# Patient Record
Sex: Male | Born: 1970
Health system: Southern US, Community
[De-identification: ages and names within clinical notes are randomized; demographics above are authoritative.]

## PROBLEM LIST (undated history)

## (undated) DIAGNOSIS — F17200 Nicotine dependence, unspecified, uncomplicated: Secondary | ICD-10-CM

## (undated) DIAGNOSIS — I10 Essential (primary) hypertension: Secondary | ICD-10-CM

## (undated) DIAGNOSIS — I472 Ventricular tachycardia: Secondary | ICD-10-CM

## (undated) DIAGNOSIS — Z95810 Presence of automatic (implantable) cardiac defibrillator: Secondary | ICD-10-CM

## (undated) DIAGNOSIS — K0889 Other specified disorders of teeth and supporting structures: Secondary | ICD-10-CM

## (undated) DIAGNOSIS — M1990 Unspecified osteoarthritis, unspecified site: Secondary | ICD-10-CM

## (undated) DIAGNOSIS — I5022 Chronic systolic (congestive) heart failure: Secondary | ICD-10-CM

## (undated) DIAGNOSIS — I213 ST elevation (STEMI) myocardial infarction of unspecified site: Secondary | ICD-10-CM

## (undated) DIAGNOSIS — R519 Headache, unspecified: Secondary | ICD-10-CM

## (undated) DIAGNOSIS — Z720 Tobacco use: Secondary | ICD-10-CM

## (undated) DIAGNOSIS — E785 Hyperlipidemia, unspecified: Secondary | ICD-10-CM

## (undated) DIAGNOSIS — E669 Obesity, unspecified: Secondary | ICD-10-CM

## (undated) DIAGNOSIS — I255 Ischemic cardiomyopathy: Secondary | ICD-10-CM

## (undated) DIAGNOSIS — I2510 Atherosclerotic heart disease of native coronary artery without angina pectoris: Secondary | ICD-10-CM

## (undated) DIAGNOSIS — R931 Abnormal findings on diagnostic imaging of heart and coronary circulation: Secondary | ICD-10-CM

## (undated) HISTORY — PX: CORONARY ANGIOPLASTY: SHX604

## (undated) HISTORY — DX: Ventricular tachycardia: I47.2

## (undated) HISTORY — PX: OTHER SURGICAL HISTORY: SHX169

## (undated) HISTORY — DX: Other specified disorders of teeth and supporting structures: K08.89

## (undated) HISTORY — DX: Presence of automatic (implantable) cardiac defibrillator: Z95.810

## (undated) HISTORY — DX: Obesity, unspecified: E66.9

## (undated) HISTORY — DX: ST elevation (STEMI) myocardial infarction of unspecified site: I21.3

## (undated) HISTORY — DX: Ischemic cardiomyopathy: I25.5

## (undated) HISTORY — DX: Hyperlipidemia, unspecified: E78.5

## (undated) HISTORY — DX: Nicotine dependence, unspecified, uncomplicated: F17.200

## (undated) HISTORY — DX: Tobacco use: Z72.0

---

## 1992-01-12 HISTORY — PX: HAND SURGERY: SHX662

## 1998-04-24 ENCOUNTER — Emergency Department (HOSPITAL_COMMUNITY): Admission: EM | Admit: 1998-04-24 | Discharge: 1998-04-24 | Payer: Self-pay | Admitting: Emergency Medicine

## 1998-09-19 ENCOUNTER — Encounter: Payer: Self-pay | Admitting: Emergency Medicine

## 1998-09-19 ENCOUNTER — Ambulatory Visit (HOSPITAL_COMMUNITY): Admission: RE | Admit: 1998-09-19 | Discharge: 1998-09-20 | Payer: Self-pay | Admitting: Orthopedic Surgery

## 1998-09-19 ENCOUNTER — Emergency Department (HOSPITAL_COMMUNITY): Admission: EM | Admit: 1998-09-19 | Discharge: 1998-09-19 | Payer: Self-pay | Admitting: Emergency Medicine

## 1999-02-25 ENCOUNTER — Ambulatory Visit (HOSPITAL_BASED_OUTPATIENT_CLINIC_OR_DEPARTMENT_OTHER): Admission: RE | Admit: 1999-02-25 | Discharge: 1999-02-25 | Payer: Self-pay | Admitting: Orthopedic Surgery

## 2001-03-24 ENCOUNTER — Ambulatory Visit (HOSPITAL_COMMUNITY): Admission: RE | Admit: 2001-03-24 | Discharge: 2001-03-24 | Payer: Self-pay | Admitting: Family Medicine

## 2001-03-24 ENCOUNTER — Encounter: Payer: Self-pay | Admitting: Family Medicine

## 2001-07-28 ENCOUNTER — Emergency Department (HOSPITAL_COMMUNITY): Admission: EM | Admit: 2001-07-28 | Discharge: 2001-07-28 | Payer: Self-pay | Admitting: Emergency Medicine

## 2002-04-10 ENCOUNTER — Encounter: Payer: Self-pay | Admitting: *Deleted

## 2002-04-10 ENCOUNTER — Emergency Department (HOSPITAL_COMMUNITY): Admission: EM | Admit: 2002-04-10 | Discharge: 2002-04-10 | Payer: Self-pay | Admitting: Unknown Physician Specialty

## 2005-10-30 ENCOUNTER — Inpatient Hospital Stay (HOSPITAL_COMMUNITY): Admission: EM | Admit: 2005-10-30 | Discharge: 2005-11-03 | Payer: Self-pay | Admitting: Emergency Medicine

## 2005-11-01 ENCOUNTER — Encounter (INDEPENDENT_AMBULATORY_CARE_PROVIDER_SITE_OTHER): Payer: Self-pay | Admitting: Cardiovascular Disease

## 2005-12-14 ENCOUNTER — Ambulatory Visit (HOSPITAL_COMMUNITY): Admission: RE | Admit: 2005-12-14 | Discharge: 2005-12-14 | Payer: Self-pay | Admitting: Cardiovascular Disease

## 2007-03-28 ENCOUNTER — Inpatient Hospital Stay (HOSPITAL_COMMUNITY): Admission: EM | Admit: 2007-03-28 | Discharge: 2007-03-29 | Payer: Self-pay | Admitting: Emergency Medicine

## 2008-05-07 ENCOUNTER — Inpatient Hospital Stay (HOSPITAL_COMMUNITY): Admission: EM | Admit: 2008-05-07 | Discharge: 2008-05-09 | Payer: Self-pay | Admitting: Emergency Medicine

## 2008-05-08 HISTORY — PX: CARDIAC CATHETERIZATION: SHX172

## 2008-07-09 ENCOUNTER — Encounter: Admission: RE | Admit: 2008-07-09 | Discharge: 2008-07-09 | Payer: Self-pay | Admitting: Cardiology

## 2008-10-17 ENCOUNTER — Emergency Department (HOSPITAL_COMMUNITY): Admission: EM | Admit: 2008-10-17 | Discharge: 2008-10-17 | Payer: Self-pay | Admitting: Emergency Medicine

## 2009-03-27 ENCOUNTER — Inpatient Hospital Stay (HOSPITAL_COMMUNITY): Admission: EM | Admit: 2009-03-27 | Discharge: 2009-04-01 | Payer: Self-pay | Admitting: Emergency Medicine

## 2009-03-31 HISTORY — PX: CARDIAC CATHETERIZATION: SHX172

## 2009-04-09 ENCOUNTER — Encounter: Payer: Self-pay | Admitting: Internal Medicine

## 2009-04-16 ENCOUNTER — Encounter: Payer: Self-pay | Admitting: Internal Medicine

## 2009-04-30 ENCOUNTER — Encounter: Payer: Self-pay | Admitting: Internal Medicine

## 2009-09-17 ENCOUNTER — Ambulatory Visit: Payer: Self-pay | Admitting: Cardiology

## 2009-09-17 ENCOUNTER — Encounter: Payer: Self-pay | Admitting: Internal Medicine

## 2009-09-23 ENCOUNTER — Ambulatory Visit (HOSPITAL_COMMUNITY): Admission: RE | Admit: 2009-09-23 | Discharge: 2009-09-23 | Payer: Self-pay | Admitting: Cardiology

## 2009-09-23 ENCOUNTER — Ambulatory Visit: Payer: Self-pay | Admitting: Cardiology

## 2009-09-23 ENCOUNTER — Encounter: Payer: Self-pay | Admitting: Internal Medicine

## 2009-10-29 ENCOUNTER — Encounter: Payer: Self-pay | Admitting: Internal Medicine

## 2009-10-29 ENCOUNTER — Ambulatory Visit: Payer: Self-pay | Admitting: Internal Medicine

## 2009-10-29 DIAGNOSIS — F17200 Nicotine dependence, unspecified, uncomplicated: Secondary | ICD-10-CM | POA: Insufficient documentation

## 2009-10-29 HISTORY — DX: Nicotine dependence, unspecified, uncomplicated: F17.200

## 2009-10-30 ENCOUNTER — Ambulatory Visit: Payer: Self-pay | Admitting: Cardiology

## 2009-10-31 ENCOUNTER — Encounter: Payer: Self-pay | Admitting: Internal Medicine

## 2009-10-31 ENCOUNTER — Telehealth: Payer: Self-pay | Admitting: Internal Medicine

## 2009-11-06 ENCOUNTER — Ambulatory Visit: Payer: Self-pay | Admitting: Internal Medicine

## 2009-11-10 ENCOUNTER — Telehealth (INDEPENDENT_AMBULATORY_CARE_PROVIDER_SITE_OTHER): Payer: Self-pay | Admitting: *Deleted

## 2009-11-11 DIAGNOSIS — Z95810 Presence of automatic (implantable) cardiac defibrillator: Secondary | ICD-10-CM

## 2009-11-11 HISTORY — DX: Presence of automatic (implantable) cardiac defibrillator: Z95.810

## 2009-11-11 HISTORY — PX: CARDIAC DEFIBRILLATOR PLACEMENT: SHX171

## 2009-11-12 ENCOUNTER — Ambulatory Visit: Payer: Self-pay | Admitting: Internal Medicine

## 2009-11-12 ENCOUNTER — Ambulatory Visit (HOSPITAL_COMMUNITY): Admission: RE | Admit: 2009-11-12 | Discharge: 2009-11-13 | Payer: Self-pay | Admitting: Internal Medicine

## 2009-11-13 ENCOUNTER — Encounter: Payer: Self-pay | Admitting: Internal Medicine

## 2009-11-18 ENCOUNTER — Encounter: Payer: Self-pay | Admitting: Internal Medicine

## 2009-11-24 ENCOUNTER — Ambulatory Visit: Payer: Self-pay | Admitting: Cardiology

## 2009-11-26 ENCOUNTER — Ambulatory Visit: Payer: Self-pay

## 2009-12-01 ENCOUNTER — Telehealth (INDEPENDENT_AMBULATORY_CARE_PROVIDER_SITE_OTHER): Payer: Self-pay | Admitting: *Deleted

## 2009-12-15 ENCOUNTER — Ambulatory Visit (HOSPITAL_BASED_OUTPATIENT_CLINIC_OR_DEPARTMENT_OTHER): Admission: RE | Admit: 2009-12-15 | Payer: Self-pay | Admitting: Internal Medicine

## 2010-02-10 NOTE — Progress Notes (Signed)
  Phone Note Outgoing Call   Call placed by: rhonda Call placed to: Patient Summary of Call: left message to adv pt to hold Effient for 3 days before procedure on 11/2, stop on 10/3.  Initial call taken by: Claris Gladden RN,  October 31, 2009 3:58 PM

## 2010-02-10 NOTE — Letter (Signed)
Summary: [facility] - Echo  [facility]   Imported By: Marylou Mccoy 11/24/2009 12:10:07  _____________________________________________________________________  External Attachment:    Type:   Image     Comment:   External Document

## 2010-02-10 NOTE — Assessment & Plan Note (Signed)
Summary: NEP. DISCUSS ICD PLACEMENT. PT HAS BCBS/ [place]   Visit Type:  Initial Consult  CC:  discuss defib placement.  History of Present Illness: [name] is seen at the request of Dr. Deborah Chalk for consideration of ICD implantation.  The patient has a complex cardiac history dating back to 2007 when he suffered an anterior wall MI and underwent LAD stenting. The next year he had occlusion of the LAD with repeat catheterization and restenting. In 2010 he suffered a non-STEMI and underwent stenting of his RCA. In March of this year he had a repeat infarction and his anterior wall distribution and underwent emergency angioplasty with thrombus extraction of his previously stented segment. He then underwent intervention of his circumflex artery. Recent reassessment of left ventricular function by echo in September demonstrated persistent depression in the 30-35% range.  He continues to smoke although is down to 3 cigarettes a day. He is a powerful family history with associated risk factors of obesity and hyperlipidemia.  He also has sleep disordered breathing and daytime fatigue and according to his wife nocturnal apnea.  He is amazingly well able to get around. He is able to climb stairs is able to play softball.    Current Medications (verified): 1)  Ramipril 10 Mg Caps (Ramipril) .... Take One Capsule By Mouth Daily 2)  Effient 10 Mg Tabs (Prasugrel Hcl) .[name].. 1 Tab Qd 3)  Toprol Xl 50 Mg Xr24h-Tab (Metoprolol Succinate) .... Daily 4)  Lipitor 80 Mg Tabs (Atorvastatin Calcium) .... Take One Tablet By Mouth Daily. 5)  Wellbutrin Xl 150 Mg Xr24h-Tab (Bupropion Hcl) .[name].. 1 Tab Once Daily 6)  Trilipix 135 Mg Cpdr (Choline Fenofibrate) .[name].. 1 Tab Once Daily 7)  Aspirin Ec 325 Mg Tbec (Aspirin) .... Take One Tablet By Mouth Daily 8)  Nitrostat 0.4 Mg Subl (Nitroglycerin) .[name].. 1 Tablet Under Tongue At Onset of Chest Pain; You May Repeat Every 5 Minutes For Up To 3 Doses.  Allergies  (verified): No Known Drug Allergies  Past History:  Family History: Last updated: 10/27/2009 His father had bypass surgery in his 29s His mother has had problems with cancer, all of his siblings are younger  Social History: Last updated: 10/29/2009 He is married.   Lives with his wife. 3 children He has had a long history of tobacco abuse and unfortunately resumed smoking.   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   Lives with his wife. 3 children He has had a long history of tobacco abuse and unfortunately resumed smoking.   He does not drink alcohol  Review of Systems       full review of systems was negative apart from a history of present illness and past medical history except as listed above   Vital Signs:  Patient profile:   40 year old male Height:      72 inches Weight:      259 pounds BMI:     35.25 Pulse rate:   65 / minute BP sitting:   117 / 74  (left arm) Cuff size:   large  Vitals Entered By: Burnett Kanaris, CNA (October 29, 2009 11:01 AM)  Physical Exam  General:  Well developed, well nourished obese young Caucasian male appearing his stated age, in no acute distress. Head:  normal HEENT Neck:  supple without thyromegaly neck veins  flat carotids brisk and full Chest Wall:  without kyphosis Lungs:  clear to auscultation Heart:  regular rate and rhythm Abdomen:  soft non tender BS without hepatomegaly or HJR Msk:  no obvious skeletal abnormalities Pulses:  intact distal pulses Extremities:  no clubbing cyanosis or edema Neurologic:  alert and oriented x3 and grossly normal motor and sensory function(obviously grave and I coordination with  a batting average of over 800) Skin:  warm and dry Cervical Nodes:  without adenopathy Psych:   engaging   Impression & Recommendations:  Problem # 1:  CARDIOMYOPATHY, ISCHEMIC MULTIPLE STENTS (ICD-414.8) the patient has a severe ischemic cardiomyopathy with ejection fraction of 30-35% and class 1-2 heart symptoms. He has a narrow QRS presents relatively).  We spent a long time reviewing the physiology of electrical complications of myocardial infarction and ischemic heart disease. We've discussed the potential benefits of ICD implantation as well as the risks including not limited to death perforation and infection. I've also noted that he is on Effient which is associated with a modestly high risk of pocket hematoma. I will discuss with Dr. Dennison Bulla as to whether he could be safely taken off for 3-4 days.   His updated medication list for this problem includes:    Ramipril 10 Mg Caps (Ramipril) .[name]... Take one capsule by mouth daily    Effient 10 Mg Tabs (Prasugrel hcl) .[name]... 1 tab qd    Toprol Xl 50 Mg Xr24h-tab (Metoprolol succinate) .[name]... Daily    Aspirin Ec 325 Mg Tbec (Aspirin) .[name]... Take one tablet by mouth daily    Nitrostat 0.4 Mg Subl (Nitroglycerin) .[name]... 1 tablet under tongue at onset of chest pain; you may repeat every 5 minutes for up to 3 doses.  Problem # 2:  SLEEP DISORDERED BREATHING (ICD-780.57) We will go ahead and arrange for out patient sllep study  Problem # 3:  TOBACCO USER (ICD-305.1) ENOCURAGED TO GET OFF SMOKING   Other Orders: Misc. Referral (Misc. Ref)  Patient Instructions: 1)  Your physician recommends that you continue on your current medications as directed. Please refer to the Current Medication list given to you today. 2)  Your physician has recommended that you have a sleep study.  Nightwatch. This test records several body functions during sleep, including:  brain activity, eye movement, oxygen and carbon dioxide blood levels, heart rate and rhythm, breathing rate and rhythm, the flow of air through your mouth and nose, snoring, body muscle  movements, and chest and belly movement.

## 2010-02-10 NOTE — Letter (Signed)
Summary: [facility] - Office Visit  [facility] - Office Visit   Imported By: Marylou Mccoy 11/24/2009 12:07:21  _____________________________________________________________________  External Attachment:    Type:   Image     Comment:   External Document

## 2010-02-10 NOTE — Letter (Signed)
Summary: [facility] - Office Visit  [facility] - Office Visit   Imported By: Marylou Mccoy 11/24/2009 12:13:34  _____________________________________________________________________  External Attachment:    Type:   Image     Comment:   External Document

## 2010-02-10 NOTE — Progress Notes (Signed)
  Phone Note Outgoing Call   Call placed by: rhonda Call placed to: Patient Details for Reason: confirming holding effient Summary of Call: Tried pt at home number and generic voice mail message answered-no message left. Tried work number and they stated no one by that name was there. Unable to confirm that previous voice message to hold Effient was received.  Initial call taken by: Claris Gladden RN,  November 10, 2009 12:12 PM

## 2010-02-10 NOTE — Cardiovascular Report (Signed)
Summary: Pre Op Orders   Pre Op Orders   Imported By: Roderic Ovens 11/07/2009 13:08:56  _____________________________________________________________________  External Attachment:    Type:   Image     Comment:   External Document

## 2010-02-10 NOTE — Letter (Signed)
Summary: Implantable Device Instructions  Architectural technologist, Main Office  [address]   [place], [place] [address]   Phone: [phone]  Fax: [phone]      Implantable Device Instructions  You are scheduled for:   __x___ Implantable Cardioverter Defibrillator   on November 12, 2009 @ noon with Dr. Graciela Husbands.  1.  Please arrive at the Short Stay Center at [facility] at 10:00 am on the day of your procedure.  2.  You may have a clear liquid breakfast the morning of your procedure before 6:00 am. Clear liquids include water, broth, Sprite, Ginger Ale, black coffee, tea (no sugar), cranberry/grape/apple juice, jello, popsicle from clear juices. Nothing to eat or drink after 6:00 am.   3.  Complete lab work on November 06, 2009 at 11:00 am.  The lab at [address] is open from 8:30 AM to 1:30 PM and from 2:30 PM to 5:00 PM.  The lab at [facility] is open from 7:30 AM to 5:30 PM.  You do not have to be fasting.   4.  Plan for an overnight stay.  Bring your insurance cards and a list of your medications.  6.  Wash your chest and neck with antibacterial soap (any brand) the evening before and the morning of your procedure.  Rinse well.    *If you have ANY questions after you get home, please call the office [phone].  Claris Gladden, RN  *Every attempt is made to prevent procedures from being rescheduled.  Due to the nauture of Electrophysiology, rescheduling can happen.  The physician is always aware and directs the staff when this occurs.

## 2010-02-10 NOTE — Letter (Signed)
Summary: [facility] - Office Visit  [facility] - Office Visit   Imported By: Marylou Mccoy 11/24/2009 12:06:49  _____________________________________________________________________  External Attachment:    Type:   Image     Comment:   External Document

## 2010-02-10 NOTE — Procedures (Signed)
Summary: wound check.mdt.amber   Current Medications (verified): 1)  Ramipril 10 Mg Caps (Ramipril) .... Take One Capsule By Mouth Daily 2)  Effient 10 Mg Tabs (Prasugrel Hcl) .[name].. 1 Tab Qd 3)  Toprol Xl 50 Mg Xr24h-Tab (Metoprolol Succinate) .... Daily 4)  Lipitor 80 Mg Tabs (Atorvastatin Calcium) .... Take One Tablet By Mouth Daily. 5)  Wellbutrin Xl 150 Mg Xr24h-Tab (Bupropion Hcl) .[name].. 1 Tab Once Daily 6)  Trilipix 135 Mg Cpdr (Choline Fenofibrate) .[name].. 1 Tab Once Daily 7)  Aspirin Ec 325 Mg Tbec (Aspirin) .... Take One Tablet By Mouth Daily 8)  Nitrostat 0.4 Mg Subl (Nitroglycerin) .[name].. 1 Tablet Under Tongue At Onset of Chest Pain; You May Repeat Every 5 Minutes For Up To 3 Doses. 9)  Omeprazole 20 Mg Cpdr (Omeprazole) .... Take 1 Capsule By Mouth Once Daily  Allergies (verified): No Known Drug Allergies   ICD Specifications Following MD:  Sherryl Manges, MD     ICD Vendor:  Medtronic     ICD Model Number:  [phone]     ICD Serial Number:  [id number] ICD DOI:  11/12/2009     ICD Implanting MD:  Sherryl Manges, MD  Lead 1:    Location: RV     DOI: 11/12/2009     Model #: [id number]     Serial #: [id number]     Status: active  Indications::  ICM   ICD Follow Up Battery Voltage:  3.24 V     Charge Time:  8.4 seconds     Underlying rhythm:  SR   ICD Device Measurements Right Ventricle:  Amplitude: 12.6 mV, Impedance: 399 ohms, Threshold: 1.25 V at 0.40 msec Shock Impedance: 46/60 ohms   Episodes MS Episodes:  0     Shock:  0     ATP:  0     Nonsustained:  0     Atrial Therapies:  0 Ventricular Pacing:  <0.1%  Brady Parameters Mode VVI     Lower Rate Limit:  40      Tachy Zones VF:  200     Next Cardiology Appt Due:  03/03/2010 Tech Comments:  WOUND CHECK---STERI STRIPS REMOVED BY CARD.  NO REDNESS AND MINIMAL SWELLING AT SITE.  NORMAL DEVICE FUNCTION.  NO CHANGES MADE. PT WOULD LIKE TO BE ENROLLED IN [place].  ROV 03-03-10 @SK . Vella Kohler  November 26, 2009 12:40 PM

## 2010-02-10 NOTE — Letter (Signed)
Summary: [facility] - Office Visit  [facility] - Office Visit   Imported By: Marylou Mccoy 11/24/2009 12:05:06  _____________________________________________________________________  External Attachment:    Type:   Image     Comment:   External Document

## 2010-02-10 NOTE — Miscellaneous (Signed)
Summary: Device preload  Clinical Lists Changes  Observations: Added new observation of ICD INDICATN: ICM (11/18/2009 13:35) Added new observation of ICDLEADSTAT1: active (11/18/2009 13:35) Added new observation of ICDLEADSER1: [id number] (11/18/2009 13:35) Added new observation of ICDLEADMOD1: 4098  (11/18/2009 13:35) Added new observation of ICDLEADLOC1: RV  (11/18/2009 13:35) Added new observation of ICD IMP MD: Sherryl Manges, MD  (11/18/2009 13:35) Added new observation of ICDLEADDOI1: 11/12/2009  (11/18/2009 13:35) Added new observation of ICD IMPL DTE: 11/12/2009  (11/18/2009 13:35) Added new observation of ICD SERL#: [id number] H  (11/18/2009 13:35) Added new observation of ICD MODL#: D334  (11/18/2009 13:35) Added new observation of ICDMANUFACTR: Medtronic  (11/18/2009 13:35) Added new observation of ICD MD: Sherryl Manges, MD  (11/18/2009 13:35)       ICD Specifications Following MD:  Sherryl Manges, MD     ICD Vendor:  Medtronic     ICD Model Number:  [phone]     ICD Serial Number:  [id number] ICD DOI:  11/12/2009     ICD Implanting MD:  Sherryl Manges, MD  Lead 1:    Location: RV     DOI: 11/12/2009     Model #: [id number]     Serial #: [id number]     Status: active  Indications::  ICM

## 2010-02-10 NOTE — Progress Notes (Signed)
  DDS request received sent to [facility]  December 01, 2009 9:28 AM

## 2010-02-10 NOTE — Assessment & Plan Note (Signed)
Summary: Jonathan Fischer

## 2010-03-03 ENCOUNTER — Encounter (INDEPENDENT_AMBULATORY_CARE_PROVIDER_SITE_OTHER): Payer: BC Managed Care – PPO | Admitting: Internal Medicine

## 2010-03-03 ENCOUNTER — Ambulatory Visit: Payer: Self-pay | Admitting: Cardiology

## 2010-03-03 ENCOUNTER — Encounter: Payer: Self-pay | Admitting: Internal Medicine

## 2010-03-03 DIAGNOSIS — I2589 Other forms of chronic ischemic heart disease: Secondary | ICD-10-CM

## 2010-03-03 DIAGNOSIS — Z95810 Presence of automatic (implantable) cardiac defibrillator: Secondary | ICD-10-CM

## 2010-03-03 HISTORY — DX: Presence of automatic (implantable) cardiac defibrillator: Z95.810

## 2010-03-05 ENCOUNTER — Ambulatory Visit (INDEPENDENT_AMBULATORY_CARE_PROVIDER_SITE_OTHER): Payer: BC Managed Care – PPO | Admitting: Cardiology

## 2010-03-05 DIAGNOSIS — I2510 Atherosclerotic heart disease of native coronary artery without angina pectoris: Secondary | ICD-10-CM

## 2010-03-05 DIAGNOSIS — I2589 Other forms of chronic ischemic heart disease: Secondary | ICD-10-CM

## 2010-03-05 DIAGNOSIS — E7800 Pure hypercholesterolemia, unspecified: Secondary | ICD-10-CM

## 2010-03-10 NOTE — Assessment & Plan Note (Signed)
Summary: dfib ck/medtronic/amber   History of Present Illness: [name] is seen i followup for an ICD implanted for complex coronary disease. This was done in November 2011. He has had no intercurrent febrile or discharges. He denies chest pain shortness of breath or palpitations.   The patient has a complex cardiac history dating back to 2007 when he suffered an anterior wall MI and underwent LAD stenting. The next year he had occlusion of the LAD with repeat catheterization and restenting. In 2010 he suffered a non-STEMI and underwent stenting of his RCA. In March of this year he had a repeat infarction and his anterior wall distribution and underwent emergency angioplasty with thrombus extraction of his previously stented segment. He then underwent intervention of his circumflex artery. Recent reassessment of left ventricular function by echo in September demonstrated persistent depression in the 30-35% range.       Current Medications (verified): 1)  Ramipril 10 Mg Caps (Ramipril) .... Take One Capsule By Mouth Daily 2)  Effient 10 Mg Tabs (Prasugrel Hcl) .[name].. 1 Tab Qd 3)  Toprol Xl 50 Mg Xr24h-Tab (Metoprolol Succinate) .... Daily 4)  Lipitor 80 Mg Tabs (Atorvastatin Calcium) .... Take One Tablet By Mouth Daily. 5)  Wellbutrin Xl 150 Mg Xr24h-Tab (Bupropion Hcl) .[name].. 1 Tab Once Daily 6)  Trilipix 135 Mg Cpdr (Choline Fenofibrate) .[name].. 1 Tab Once Daily 7)  Aspirin Ec 325 Mg Tbec (Aspirin) .... Take One Tablet By Mouth Daily 8)  Nitrostat 0.4 Mg Subl (Nitroglycerin) .[name].. 1 Tablet Under Tongue At Onset of Chest Pain; You May Repeat Every 5 Minutes For Up To 3 Doses. 9)  Omeprazole 20 Mg Cpdr (Omeprazole) .... 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   Lives with his wife. 3 children He has had a long history of tobacco abuse and unfortunately resumed smoking.   He does not drink alcohol  Vital Signs:  Patient profile:   40 year old male Height:      72 inches Weight:      263 pounds BMI:     35.80 Pulse rate:   67 / minute Pulse rhythm:   regular BP sitting:   118 / 76  (right arm) Cuff size:   large  Vitals Entered By: Judithe Modest CMA (March 03, 2010 10:43 AM)  Physical Exam  General:  The patient was alert and oriented in no acute distress. HEENT Normal.  Neck veins were flat, carotids were brisk.  Lungs were clear.  Heart sounds were regular without murmurs or gallops.  Abdomen was soft with active bowel sounds. There is no clubbing cyanosis or edema. Skin Warm and dry as well he    ICD Specifications Following MD:  Sherryl Manges, MD     ICD Vendor:  Medtronic     ICD Model Number:  [phone]     ICD Serial Number:  [id number] ICD DOI:  11/12/2009     ICD Implanting MD:  Sherryl Manges, MD  Lead 1:    Location: RV     DOI: 11/12/2009     Model #:  [id number]     Serial #: [id number]     Status: active  Indications::  ICM   Brady Parameters Mode VVI     Lower Rate Limit:  40      Tachy Zones VF:  200     Impression & Recommendations:  Problem # 1:  AUTOMATIC IMPLANTABLE CARDIAC DEFIBRILLATOR SITU (ICD-V45.02) Device parameters and data were reviewed and no changes were made  Problem # 2:  CARDIOMYOPATHY, ISCHEMIC MULTIPLE STENTS (ICD-414.8) stable on current medications His updated medication list for this problem includes:    Ramipril 10 Mg Caps (Ramipril) .[name]... Take one capsule by mouth daily    Effient 10 Mg Tabs (Prasugrel hcl) .[name]... 1  tab qd    Toprol Xl 50 Mg Xr24h-tab (Metoprolol succinate) .[name]... Daily    Aspirin Ec 325 Mg Tbec (Aspirin) .[name]... Take one tablet by mouth daily    Nitrostat 0.4 Mg Subl (Nitroglycerin) .[name]... 1 tablet under tongue at onset of chest pain; you may repeat every 5 minutes for up to 3 doses.  Patient Instructions: 1)  Your physician recommends that you continue on your current medications as directed. Please refer to the Current Medication list given to you today. 2)  Your physician wants you to follow-up in:   NOV 2012 WITH DR Logan Bores will receive a reminder letter in the mail two months in advance. If you don't receive a letter, please call our office to schedule the follow-up appointment.

## 2010-03-11 ENCOUNTER — Telehealth (INDEPENDENT_AMBULATORY_CARE_PROVIDER_SITE_OTHER): Payer: Self-pay | Admitting: *Deleted

## 2010-03-19 NOTE — Cardiovascular Report (Signed)
Summary: Office Visit   Office Visit   Imported By: Roderic Ovens 03/12/2010 16:07:35  _____________________________________________________________________  External Attachment:    Type:   Image     Comment:   External Document

## 2010-03-19 NOTE — Progress Notes (Signed)
  DDS Request received sent to [facility]  [facility]  March 11, 2010 8:39 AM     Appended Document:  DDS Request Received sent to [facility]

## 2010-03-24 LAB — BASIC METABOLIC PANEL
CO2: 25 mEq/L (ref 19–32)
Calcium: 9.1 mg/dL (ref 8.4–10.5)
GFR calc Af Amer: >60 mL/min (ref >60)
GFR calc non Af Amer: >60 mL/min (ref >60)
Potassium: 4 mEq/L (ref 3.5–5.1)
Sodium: 140 mEq/L (ref 135–145)

## 2010-03-24 LAB — SURGICAL PCR SCREEN
MRSA, PCR: NEGATIVE
Staphylococcus aureus: NEGATIVE

## 2010-03-24 LAB — CBC
HCT: 43.5 % (ref 39.0–52.0)
Hemoglobin: 15.4 g/dL (ref 13.0–17.0)
RBC: 4.76 MIL/uL (ref 4.22–5.81)

## 2010-03-24 LAB — PROTIME-INR
INR: 1.02 (ref 0.00–1.49)
Prothrombin Time: 13.6 seconds (ref 11.6–15.2)

## 2010-04-05 LAB — CBC
HCT: 41.3 % (ref 39.0–52.0)
HCT: 44.6 % (ref 39.0–52.0)
HCT: 46.2 % (ref 39.0–52.0)
HCT: 47.2 % (ref 39.0–52.0)
Hemoglobin: 14.6 g/dL (ref 13.0–17.0)
Hemoglobin: 15.5 g/dL (ref 13.0–17.0)
Hemoglobin: 16 g/dL (ref 13.0–17.0)
MCHC: 34.6 g/dL (ref 30.0–36.0)
MCHC: 34.7 g/dL (ref 30.0–36.0)
MCV: 93.6 fL (ref 78.0–100.0)
MCV: 93.8 fL (ref 78.0–100.0)
Platelets: 233 10*3/uL (ref 150–400)
Platelets: 238 10*3/uL (ref 150–400)
Platelets: 277 10*3/uL (ref 150–400)
RBC: 5.04 MIL/uL (ref 4.22–5.81)
RDW: 12.8 % (ref 11.5–15.5)
RDW: 12.9 % (ref 11.5–15.5)
RDW: 13.4 % (ref 11.5–15.5)
WBC: 11.1 10*3/uL — ABNORMAL HIGH (ref 4.0–10.5)
WBC: 11.8 10*3/uL — ABNORMAL HIGH (ref 4.0–10.5)

## 2010-04-05 LAB — BASIC METABOLIC PANEL
BUN: 12 mg/dL (ref 6–23)
BUN: 14 mg/dL (ref 6–23)
CO2: 26 mEq/L (ref 19–32)
CO2: 26 mEq/L (ref 19–32)
Calcium: 8.7 mg/dL (ref 8.4–10.5)
Calcium: 9.2 mg/dL (ref 8.4–10.5)
Chloride: 100 mEq/L (ref 96–112)
Chloride: 102 mEq/L (ref 96–112)
Creatinine, Ser: 0.93 mg/dL (ref 0.4–1.5)
GFR calc Af Amer: >60 mL/min (ref >60)
GFR calc non Af Amer: >60 mL/min (ref >60)
GFR calc non Af Amer: >60 mL/min (ref >60)
GFR calc non Af Amer: >60 mL/min (ref >60)
Glucose, Bld: 116 mg/dL — ABNORMAL HIGH (ref 70–99)
Glucose, Bld: 121 mg/dL — ABNORMAL HIGH (ref 70–99)
Glucose, Bld: 123 mg/dL — ABNORMAL HIGH (ref 70–99)
Potassium: 4.1 mEq/L (ref 3.5–5.1)
Potassium: 4.3 mEq/L (ref 3.5–5.1)
Sodium: 135 mEq/L (ref 135–145)
Sodium: 138 mEq/L (ref 135–145)
Sodium: 138 mEq/L (ref 135–145)

## 2010-04-05 LAB — CARDIAC PANEL(CRET KIN+CKTOT+MB+TROPI)
CK, MB: 3.7 ng/mL (ref 0.3–4.0)
Total CK: 453 U/L — ABNORMAL HIGH (ref 7–232)
Troponin I: 5.5 ng/mL (ref 0.00–0.06)

## 2010-04-05 LAB — LIPID PANEL
Cholesterol: 218 mg/dL — ABNORMAL HIGH (ref 0–200)
HDL: 30 mg/dL — ABNORMAL LOW (ref >39)
Total CHOL/HDL Ratio: 7.3 RATIO

## 2010-04-05 LAB — POCT CARDIAC MARKERS: Myoglobin, poc: 71.6 ng/mL (ref 12–200)

## 2010-04-05 LAB — PROTIME-INR
INR: 1.05 (ref 0.00–1.49)
Prothrombin Time: 13.6 seconds (ref 11.6–15.2)

## 2010-04-05 LAB — DIFFERENTIAL
Eosinophils Absolute: 0 10*3/uL (ref 0.0–0.7)
Lymphocytes Relative: 26 % (ref 12–46)
Lymphs Abs: 3.1 10*3/uL (ref 0.7–4.0)
Neutro Abs: 7.8 10*3/uL — ABNORMAL HIGH (ref 1.7–7.7)
Neutrophils Relative %: 67 % (ref 43–77)

## 2010-04-05 LAB — COMPREHENSIVE METABOLIC PANEL
BUN: 10 mg/dL (ref 6–23)
CO2: 30 mEq/L (ref 19–32)
Chloride: 98 mEq/L (ref 96–112)
Creatinine, Ser: 1 mg/dL (ref 0.4–1.5)
GFR calc non Af Amer: >60 mL/min (ref >60)
Total Bilirubin: 0.7 mg/dL (ref 0.3–1.2)

## 2010-04-05 LAB — TSH: TSH: 0.549 u[IU]/mL (ref 0.350–4.500)

## 2010-04-05 LAB — APTT: aPTT: 26 seconds (ref 24–37)

## 2010-04-13 ENCOUNTER — Telehealth: Payer: Self-pay | Admitting: Cardiology

## 2010-04-13 NOTE — Telephone Encounter (Signed)
Pt's wife notified of the dates of heart attacks.

## 2010-04-13 NOTE — Telephone Encounter (Signed)
NEEDS DATES OF THE HEART ATTACKS. SHE THINKS THERE IS 3.

## 2010-04-16 LAB — CBC
HCT: 43.9 % (ref 39.0–52.0)
Hemoglobin: 15.1 g/dL (ref 13.0–17.0)
MCHC: 34.5 g/dL (ref 30.0–36.0)
RDW: 13.1 % (ref 11.5–15.5)

## 2010-04-16 LAB — POCT I-STAT, CHEM 8
Calcium, Ion: 1.15 mmol/L (ref 1.12–1.32)
Glucose, Bld: 141 mg/dL — ABNORMAL HIGH (ref 70–99)
HCT: 46 % (ref 39.0–52.0)
Hemoglobin: 15.6 g/dL (ref 13.0–17.0)
Potassium: 4.1 mEq/L (ref 3.5–5.1)

## 2010-04-16 LAB — HEPATIC FUNCTION PANEL
ALT: 44 U/L (ref 0–53)
AST: 25 U/L (ref 0–37)
Bilirubin, Direct: 0.1 mg/dL (ref 0.0–0.3)
Total Bilirubin: 0.8 mg/dL (ref 0.3–1.2)

## 2010-04-22 LAB — CBC
HCT: 38.8 % — ABNORMAL LOW (ref 39.0–52.0)
HCT: 45 % (ref 39.0–52.0)
Hemoglobin: 13.8 g/dL (ref 13.0–17.0)
Hemoglobin: 14.9 g/dL (ref 13.0–17.0)
Hemoglobin: 15.9 g/dL (ref 13.0–17.0)
MCHC: 34.8 g/dL (ref 30.0–36.0)
MCHC: 35.3 g/dL (ref 30.0–36.0)
MCV: 93.4 fL (ref 78.0–100.0)
MCV: 94.3 fL (ref 78.0–100.0)
Platelets: 173 10*3/uL (ref 150–400)
Platelets: 200 10*3/uL (ref 150–400)
RDW: 12.9 % (ref 11.5–15.5)
RDW: 13.1 % (ref 11.5–15.5)
RDW: 13.3 % (ref 11.5–15.5)
RDW: 13.3 % (ref 11.5–15.5)
WBC: 7.4 10*3/uL (ref 4.0–10.5)

## 2010-04-22 LAB — RAPID URINE DRUG SCREEN, HOSP PERFORMED
Amphetamines: NOT DETECTED
Cocaine: NOT DETECTED
Opiates: POSITIVE — AB
Tetrahydrocannabinol: NOT DETECTED

## 2010-04-22 LAB — CARDIAC PANEL(CRET KIN+CKTOT+MB+TROPI): Relative Index: 6.7 — ABNORMAL HIGH (ref 0.0–2.5)

## 2010-04-22 LAB — BASIC METABOLIC PANEL
BUN: 7 mg/dL (ref 6–23)
CO2: 24 mEq/L (ref 19–32)
Chloride: 104 mEq/L (ref 96–112)
Glucose, Bld: 103 mg/dL — ABNORMAL HIGH (ref 70–99)
Glucose, Bld: 111 mg/dL — ABNORMAL HIGH (ref 70–99)
Potassium: 3.8 mEq/L (ref 3.5–5.1)
Potassium: 3.8 mEq/L (ref 3.5–5.1)
Sodium: 137 mEq/L (ref 135–145)
Sodium: 140 mEq/L (ref 135–145)

## 2010-04-22 LAB — POCT CARDIAC MARKERS
CKMB, poc: 2.2 ng/mL (ref 1.0–8.0)
Myoglobin, poc: 127 ng/mL (ref 12–200)
Troponin i, poc: <0.05 ng/mL (ref 0.00–0.09)
Troponin i, poc: <0.05 ng/mL (ref 0.00–0.09)

## 2010-04-22 LAB — CK TOTAL AND CKMB (NOT AT ARMC)
CK, MB: 10.7 ng/mL — ABNORMAL HIGH (ref 0.3–4.0)
Relative Index: 5 — ABNORMAL HIGH (ref 0.0–2.5)
Total CK: 216 U/L (ref 7–232)

## 2010-04-22 LAB — DIFFERENTIAL
Basophils Absolute: 0.1 10*3/uL (ref 0.0–0.1)
Basophils Relative: 1 % (ref 0–1)
Eosinophils Relative: 1 % (ref 0–5)
Lymphocytes Relative: 22 % (ref 12–46)

## 2010-04-22 LAB — COMPREHENSIVE METABOLIC PANEL
Albumin: 3.5 g/dL (ref 3.5–5.2)
BUN: 13 mg/dL (ref 6–23)
Calcium: 8.6 mg/dL (ref 8.4–10.5)
Creatinine, Ser: 0.85 mg/dL (ref 0.4–1.5)
Potassium: 3.9 mEq/L (ref 3.5–5.1)
Total Protein: 6.3 g/dL (ref 6.0–8.3)

## 2010-04-22 LAB — LIPID PANEL
Cholesterol: 135 mg/dL (ref 0–200)
HDL: 24 mg/dL — ABNORMAL LOW (ref >39)
LDL Cholesterol: UNDETERMINED mg/dL (ref 0–99)
Triglycerides: 468 mg/dL — ABNORMAL HIGH (ref <150)

## 2010-04-22 LAB — HEPARIN LEVEL (UNFRACTIONATED)
Heparin Unfractionated: 0.38 IU/mL (ref 0.30–0.70)
Heparin Unfractionated: 0.56 IU/mL (ref 0.30–0.70)

## 2010-04-22 LAB — PROTIME-INR: INR: 1.1 (ref 0.00–1.49)

## 2010-04-22 LAB — APTT: aPTT: 146 seconds — ABNORMAL HIGH (ref 24–37)

## 2010-04-29 ENCOUNTER — Emergency Department: Payer: Self-pay | Admitting: Emergency Medicine

## 2010-05-26 NOTE — Cardiovascular Report (Signed)
[name], Jonathan Fischer NO.:  [id number]   MEDICAL RECORD NO.:  [id number]          PATIENT TYPE:  INP   LOCATION:  2913                         FACILITY:  [facility]   PHYSICIAN:  Colleen Can. Deborah Chalk, M.D.DATE OF BIRTH:  12-18-1970   DATE OF PROCEDURE:  03/28/2007  DATE OF DISCHARGE:                            CARDIAC CATHETERIZATION   PROCEDURES:  1. Left heart catheterization.  2. Selective coronary angiography.  3. Left ventricular angiography.   TYPE AND SITE OF ENTRY:  Percutaneous right femoral artery.   CATHETERS:  6-French 4 curved Judkins right and left coronary catheters,  6-French pigtail ventriculographic catheter.   CONTRAST MATERIAL:  Omnipaque.   MEDICATIONS GIVEN PRIOR TO PROCEDURE:  Heparin.  The patient was on  aspirin and Plavix.   MEDICATIONS GIVEN DURING PROCEDURE:  Versed 4 mg IV.   COMMENT:  The patient tolerated the procedure well.   ANGIOGRAPHIC DATA:  The left ventricular angiogram was performed in the  RAO position.  Overall cardiac size and silhouette are normal.  There  was anterior apical hypokinesia with a global ejection fraction  estimated be of 35-40%.  There was no mitral regurgitation, intracardiac  calcification or intracavitary filling defect.   1. Right coronary artery:  The right coronary artery had proximal 30-      40% narrowings.  The distal vessel was relatively large with a      large posterior descending and posterior lateral branches.  2. His left main coronary artery is normal.  3. Left circumflex:  Left circumflex had 20-30% narrowings.  It ended      on the posterior lateral wall.  4. Left anterior descending:  Left anterior descending was widely      patent throughout.  There were stents in the proximal portion of      the left anterior descending between the first septal perforating      branch and the first diagonal vessel.  The stent was widely patent.      There was TIMI grade 3 flow.  There did not  appear to be any      residual thrombus noted in any of the distal branches.   OVERALL IMPRESSION:  1. Anteroapical hypokinesia with reduced left ventricular ejection      fraction.  2. Persistent patency of the stents in the proximal left anterior      descending.  3. Mild to moderate coronary atherosclerosis in the right coronary      artery and left circumflex system.   DISCUSSION:  In light of these findings, the patient will continue to be  managed medically.  We will rule out myocardial infarction under the  possibility at there was thrombosis that lysed prior to the procedure.  Overall it is felt that the patient is stable at this point in time.      Colleen Can. Deborah Chalk, M.D.  Electronically Signed     SNT/MEDQ  D:  03/28/2007  T:  03/28/2007  Job:  [id number]

## 2010-05-26 NOTE — H&P (Signed)
NAMEGLADSTONE, Jonathan Fischer NO.:  [id number]   MEDICAL RECORD NO.:  [id number]          PATIENT TYPE:  INP   LOCATION:  1832                         FACILITY:  [facility]   PHYSICIAN:  Colleen Can. Deborah Chalk, M.D.DATE OF BIRTH:  20-Aug-1970   DATE OF ADMISSION:  05/07/2008  DATE OF DISCHARGE:                              HISTORY & PHYSICAL   CHIEF COMPLAINT:  Chest Pain   HISTORY OF PRESENT ILLNESS:  Mr. 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  He was not particularly working hard, then  at approximately 10:00 a.m. he developed the chest pain that is  substernal in nature, he got hot, he took 2 nitroglycerin and then felt  somewhat weak.  EMS was called and he presented to the emergency room.  Approximately 4 weeks ago, he had an episode of chest pain that occurred  approximately 30 minutes after having played a ball game.  He took two  nitroglycerin at that point in time and got relief.   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  He  had recurrent occlusion of left anterior descending with catheterization  and stenting at [facility] in 2008.  He presented and had  catheterization in March 2009 with 30-40% narrowing of his right  coronary artery, left main coronary artery being normal, left circumflex  with 20-30% narrowing, and the left anterior descending is widely patent  with stents patent.  There was anteroapical hypokinesia but his global  ejection fraction estimated to be in the mildly reduced range.  He has  ongoing tobacco abuse.   ALLERGIES:  None known.   CURRENT MEDICATIONS:  1. Ramipril 5 mg a day.  2. Lipitor 80 mg a day.  3. Plavix 75 mg a day.  4. Metoprolol succinate 25 mg a day.  5. Wellbutrin 150 mg a day.  6. Protonix 40 mg every other day.  7. Aspirin 325 mg a day.  8. Lovaza 2 p.o. b.i.d.   REVIEW OF SYSTEMS:   Generally, he has been doing well and feels good.  He has had some numbness in his hands.  No back pain.  His hands have  been going to sleep. He continues to struggle with weight loss.  All  other review of systems are negative.   SOCIAL HISTORY:  Smokes 6 cigarettes per day.  He is married. Lives with  wife. No alcohol use.   FAMILY HISTORY:  Father had bypass in his 18s and mother has had  problems with cancer.  His brothers are all younger.   PHYSICAL EXAMINATION:  GENERAL:  He is a pleasant obese 40 year old  male.  He is in no acute distress.  VITAL SIGNS:  His blood pressure is 128/81, heart rate 60, respiratory  rate is 20.  SKIN:  Warm and dry.  COLOR: Normal.  HEENT:  He is normocephalic and atraumatic.  Pupils equal, round, and  reactive to light.  Oropharynx is clear.  NECK:  Supple and thick. No bruits. No  masses.  LUNGS:  Clear. He is not short of breath.  HEART:  Regular rate and rhythm. No murmur.  ABDOMEN:  Soft and nontender.  No masses.  EXTREMITIES:  Without edema.  Peripheral pulses are intact.  M/S:  ROM is intact. Gait not tested. Strength is symmetric.  NEURO:  Intact with no gross focal deficits.   LABORATORY DATA:  His EKG shows no acute changes.  It shows findings  compatible with an old anterior myocardial infarction but there were no  acute ST changes present. Other labs are pending.   OVERALL IMPRESSION:  1. Unstable angina, rule out myocardial infarction.  2. Previous anterior myocardial infarction with stents.  3. Hypercholesterolemia.  4. Ongoing cigarette abuse.   PLAN:  We will proceed on with cardiac catheterization tomorrow.  We  will rule out myocardial infarction.  Procedure risks and benefits have  been explained to the patient including procedure sustained.      Colleen Can. Deborah Chalk, M.D.  Electronically Signed     SNT/MEDQ  D:  05/07/2008  T:  05/08/2008  Job:  [id number]

## 2010-05-26 NOTE — Discharge Summary (Signed)
Jonathan Fischer, Jonathan Fischer NO.:  [id number]   MEDICAL RECORD NO.:  [id number]          PATIENT TYPE:  INP   LOCATION:  2913                         FACILITY:  [facility]   PHYSICIAN:  Colleen Can. Deborah Chalk, M.D.DATE OF BIRTH:  1970-12-08   DATE OF ADMISSION:  03/28/2007  DATE OF DISCHARGE:  03/29/2007                               DISCHARGE SUMMARY   DISCHARGE DIAGNOSES:  1. Chest pain in the setting of known ischemic heart disease with      subsequent emergent cardiac catheterization showing anterior      hypokinesis with ejection fraction of 35-40%, diffuse proximal      narrowing to the right coronary that is 30-40% in nature, less than      20% irregularities of the left circumflex, proximal stent patent in      the LAD and normal left main.  2. History of anterior myocardial infarction in October 2007 with      stent placement to the LAD.  He has had follow-up catheterization      since that time.  He had recurrent occlusion of the LAD with repeat      catheterization and stenting in [place] in 2008.  3. Ongoing tobacco abuse.  4. Hyperlipidemia   HISTORY OF PRESENT ILLNESS:  The patient is a 40 year old male who  presented to the emergency room with approximately one hour duration of  substernal chest pain.  It was similar to his previous chest pain  syndrome associated with myocardial infarction.  He has had ongoing  tobacco abuse of approximately five cigarettes a day.  He denies abusing  drugs and does take his medicines on a regular basis.  He was  subsequently seen in the emergency room.  There was concern for ST  elevation on his EKG. Code stenting was initiated, and the patient was  taken emergently to the cardiac catheterization lab.   Please see the History and Physical for the patient presentation and  profile.   LABORATORY DATA:  On admission CBC showed hemoglobin 15, hematocrit 44.  Chemistries were normal except for glucose of 129.  Cardiac  profile  showed all the CK-MBs to be negative, troponins were 0.09 to 0.18 to  0.12, respectively.   His EKG showed changes compatible with anterior myocardial infarction  with evidence for QS waves in V1-V3 with small Q waves in V4 but with  distinct ST elevation.   HOSPITAL COURSE:  The patient was taken emergently to the cardiac  catheterization lab.  Cardiac catheterization was performed without any  known complications.  The results are as noted above.  He was  subsequently transferred to the coronary care unit.  His Prilosec was  discontinued.  He was switched over to Pepcid.  His other home medicines  were continued.  Smoking cessation consult was called for.  By the  following day he had had a good night with no further episodes of  discomfort.  He was very anxious to be discharged.  Lengthy  conversations ensued with the patient and his wife regarding his need  for discharge.   DISCHARGE CONDITION:  Stable.   DISCHARGE MEDICATIONS:  1. Prilosec is discontinued.  He switched over to Pepcid 40 mg to take      at bedtime.  2. His other medicines will continue as he was taking at home which      include Plavix 75 mg a day, enteric-coated aspirin 325 a day,      nitroglycerin p.r.n., Lipitor 80 mg a day, ramipril 10 mg a day.  3. He was also given a prescription for Wellbutrin 150 b.i.d. to help      with smoking cessation.   He is asked to not return to work for five days as well as not to smoke.  Will plan on seeing him back in our office in approximately two weeks or  certainly sooner if needed.      Sharlee Blew, N.P.      Colleen Can. Deborah Chalk, M.D.  Electronically Signed    LC/MEDQ  D:  03/29/2007  T:  03/30/2007  Job:  [id number]

## 2010-05-26 NOTE — Cardiovascular Report (Signed)
Jonathan Fischer, SHOCK NO.:  [id number]   MEDICAL RECORD NO.:  [id number]          PATIENT TYPE:  INP   LOCATION:  2807                         FACILITY:  [facility]   PHYSICIAN:  Colleen Can. Deborah Chalk, M.D.DATE OF BIRTH:  10-Mar-1970   DATE OF PROCEDURE:  05/08/2008  DATE OF DISCHARGE:                            CARDIAC CATHETERIZATION   PROCEDURE:  Left heart catheterization with selective coronary  angiography, left ventricular angiography with a stent in the mid right  coronary artery.   TYPE AND SITE OF ENTRY:  Percutaneous right femoral artery with Angio-  Seal.   CATHETERS:  6-French four curved Judkins right and left coronary  catheter, 6-French pigtail ventriculographic catheter.   6-French JR-4 guide with side holes, Prowater guidewire, predilatation  with a 3.0 x 20 mm apex balloon and subsequent stent placement with a 23  mm x 3.5 mm XIENCE (drug-eluting stent).   CONTRAST MATERIAL:  Omnipaque.   MEDICATIONS GIVEN PRIOR TO PROCEDURE:  Valium 10 mg p.o., Integrilin, IV  heparin, and IV nitroglycerin.   MEDICATIONS GIVEN DURING THE PROCEDURE:  Versed 3 mg IV and Ancef 1 g  IV.   COMMENT:  The patient tolerated the procedure well.   HEMODYNAMIC DATA:  Aortic pressure was 117/63, LV was 115-30.  There is  no aortic valve gradient noted on pullback.   ANGIOGRAPHIC DATA:  Left ventricular angiogram was performed in RAO  position.  Overall cardiac size and silhouette were normal.  There was  anterior apical hypokinesis with a global ejection fraction of 35-40%.  There is no mitral regurgitation, intracardiac calcification, or  intracavitary filling defect.   CORONARY ARTERIES:  1. Left main coronary artery is normal.  2. Left circumflex continued mainly on the inferior lateral wall.  It      had irregularities but no significant focal stenosis.  3. Left anterior descending had a stent in its proximal segment.      There is a 30% narrowing after the  stent and just before the      largest of the diagonal vessels.  There was excellent flow in the      diagonals as well as left anterior descending distally.  4. Right coronary artery.  The right coronary artery with a large      dominant vessel.  It had 95% segmental stenosis in its midportion.   ANGIOPLASTY PROCEDURE:  We changed JR-4 guide with side holes.  This  provided adequate backup.  The Prowater guidewire easily crossed the  lesion.  We predilated with a 3.0 x 20 mm apex balloon.  A 23 x 3.5  XIENCE drug-eluting stent was of appropriate length, but we did not have  extra length to spare and it was felt to be a nice fit for the lesion.  It was inflated to maximum of 16 atmospheres, which would result in a  3.9 mm internal diameter of the stent.  It is inflated on two different  occasions.  The followup angiograms showed no residual stenosis and  excellent distal flow.   OVERALL IMPRESSION:  1. Successful stent placement (drug-eluting stent) in the  mid right      coronary artery.  2. Persistent patency of the previously placed stent in the left      anterior descending.  3. Mild irregularities in the left anterior descending after the stent      with minimal irregularities in the left circumflex.  4. Moderate left ventricular dysfunction with anterior apical      hypokinesia.   DISCUSSION:  We will continue to proceed on with risk factor  modification and management of left ventricular dysfunction.  He will  need to be on continued Plavix for at least one more year and probably a  longer timeframe than that given the fact that his left anterior  descending stent has had in-stent thrombosis in the past.      Colleen Can. Deborah Chalk, M.D.  Electronically Signed     SNT/MEDQ  D:  05/08/2008  T:  05/09/2008  Job:  [id number]

## 2010-05-26 NOTE — Discharge Summary (Signed)
Jonathan, Fischer NO.:  [id number]   MEDICAL RECORD NO.:  [id number]          PATIENT TYPE:  INP   LOCATION:  2505                         FACILITY:  [facility]   PHYSICIAN:  Jonathan Fischer, M.D.DATE OF BIRTH:  1970-09-04   DATE OF ADMISSION:  05/07/2008  DATE OF DISCHARGE:  05/09/2008                               DISCHARGE SUMMARY   DISCHARGE DIAGNOSES:  1. Non-ST elevation myocardial infarction with subsequent elective      cardiac catheterization and percutaneous coronary intervention to      the right coronary artery with a 3.5 x 23 mm Xience stent.  2. Left ventricular dysfunction, ejection fraction is 35-40%.  3. Known ischemic heart disease with previous anterior myocardial      infarction in October 2007, with stenting of the left anterior      descending.  He has had previous occlusion of the left anterior      descending coronary artery with catheterization and stenting in      [place] in 2008.  He had catheterization in March 2009.  His      ejection fraction at that time was in the 35-40% range.  4. Obesity.  5. Ongoing tobacco abuse.  6. Hyperlipidemia.  7. Gastroesophageal reflux disease.   HISTORY OF PRESENT ILLNESS:  Jonathan Fischer is a 40 year old white male who has  known ischemic heart disease.  He presents to the hospital with  recurrence of chest pain.  He was out working but not particularly hard  when he developed chest pain that was substernal in nature.  He became  hot.  He took 2 nitroglycerin and felt somewhat weak.  EMS was called  and he was brought to the emergency room.  He was treated with IV  nitroglycerin and IV heparin and got relief.  He was subsequently  admitted for further evaluation.   Please see the history and physical for further patient presentation and  profile.   LABORATORY DATA:  CBC showed hemoglobin 14, hematocrit 40, white count  was 7000.  Chemistries were normal with a BUN of 13, creatinine 0.8,  glucose  was 100.  His peak troponin was 2.8.  His peak CK-MB was 258  with an MB fraction of 17.2.  His EKG showed no acute changes.  It did  have findings compatible with an old anterior myocardial infarction.   Chest x-ray on admission which was a portable film showed bilateral  airspace disease.   HOSPITAL COURSE:  The patient was admitted electively from the emergency  room.  He was placed on IV nitroglycerin and IV heparin.  He did have  some residual pain the following morning and Integrilin was subsequently  started.  We proceeded on with cardiac catheterization, the following  afternoon.  The findings are as follows:  The left main coronary is  normal.  The left circumflex has irregularities.  Stent in the LAD is  patent.  There is a 30% narrowing after the stent, but before the  largest diagonal.  There is good distal flow to the LAD.  The right  coronary artery had  95% stenosis.  It was dominant.  Subsequent stenting  was performed with an overall satisfactory result obtained.  A 3.5 x 23  mm Xience stent was placed.  Ejection fraction is 35-40% with anterior  hypokinesia noted.   Postprocedure, he was transferred to 2500.  He has done well throughout  the remainder of his hospitalization.  He has had a consult for tobacco  cessation during this admission as well.  By May 09, 2008, he was  doing well without complaints.  Postprocedure lab is unremarkable and he  is felt to be a satisfactory candidate for discharge.   Discharge condition is stable.   DISCHARGE DIET:  Low salt, heart healthy.   Activity is to be increased very slowly.  He is not to drive or engage  in sexual activity.   He is to use an ice pack if needed to the groin.   Special instruction is to STOP smoking.   DISCHARGE MEDICINES:  1. Aspirin 325 a day.  2. Effient 10 mg a day.  3. Metoprolol 25 mg a day.  4. Omeprazole daily as before.  5. Ramipril 5 mg a day.  6. Lipitor 80 mg a day.  7. Wellbutrin  150 mg a day.  8. Nitroglycerin if needed.  9. Lovaza 2 g 2 times a day.  10.Trilipix 135 a day.   He is not to return to work at this point in time.  We will plan on  seeing him back in the office in approximately 7-10 days.  He may need  to have a followup 2-D echocardiogram to evaluate his LV function.  He  is to call if any problems arise in the interim.   Greater than 30 minutes spent for discharge.      Jonathan Fischer, N.P.      Jonathan Fischer, M.D.  Electronically Signed    LC/MEDQ  D:  05/09/2008  T:  05/09/2008  Job:  [id number]

## 2010-05-26 NOTE — H&P (Signed)
NAMEBRODRICK, CURRAN NO.:  [id number]   MEDICAL RECORD NO.:  [id number]          PATIENT TYPE:  INP   LOCATION:  2807                         FACILITY:  [facility]   PHYSICIAN:  Colleen Can. Deborah Chalk, M.D.DATE OF BIRTH:  [birth year]   DATE OF ADMISSION:  03/28/2007  DATE OF DISCHARGE:                              HISTORY & PHYSICAL   HISTORY:  Jonathan Fischer is a 40 year old male who presents with  approximately an hour's duration of substernal chest pain.  It began  abruptly this morning and was similar to his previous pain with  myocardial infarction.   He had an acute anterior myocardial infarction October 30, 2005 and was  taken to the catheterization lab and had a stent placed in his proximal  left anterior descending.  He had follow up catheterization and noted to  have persistent patency and return of most of his anterior wall  function.  He had recurrent occlusion of the left anterior descending  and had a subsequent repeat catheterization and stent placed in his left  anterior descending at [facility] in 2008.  He has continued to smoke  cigarettes approximately 5 cigarettes per day.  He does not abuse drugs.  He does take his medicines regularly.   CURRENT MEDICATIONS:  1. Plavix.  2. Aspirin.  3. Nitroglycerin p.r.n.  4. Lipitor 80 mg a day.  5. Ramipril.  6. Prilosec.   PAST MEDICAL HISTORY:  Reasonably unremarkable other than myocardial  infarction.   FAMILY HISTORY:  Father has had bypass surgery in his 67s.  Mother has  had problems with cancer.  His brothers were all younger and have not  had any cardiac issues.   SOCIAL HISTORY:  He is married.  He has 3 boys ages 54 and younger.  His  wife works in a pharmacy.   REVIEW OF SYSTEMS:  Otherwise unremarkable.  He has tried to stop  smoking and finds that quite difficult.  He generally does well except  for the shortness of breath associated with the ongoing pain.   PHYSICAL EXAMINATION:  VITAL  SIGNS:  Blood pressure is 128/77, heart  rate 63, respiratory rate 24.  HEENT:  Negative.  NECK:  Supple.  LUNGS:  Clear.  HEART:  Regular rate and rhythm.  ABDOMEN:  Soft, nontender.  EXTREMITIES:  Without edema.   DIAGNOSTICS:  EKG shows changes compatible with an anterior myocardial  infarction with evidence for QS waves in V1-V3 with a small Q-wave in  V4, but with persistent ST elevation.   OVERALL IMPRESSION:  1. Anterior chest pain similar to previous myocardial heart infarction      pain with history of previous myocardial infarction as well as      occlusion of the stent in 2008 with his initial myocardial      infarction being in October 2007.  2. Hyperlipidemia.  3. Ongoing cigarette use.   PLAN:  We will take the patient emergently to the cardiac  catheterization lab.  The patient understands the benefit, risk and is  willing to proceed.      Colleen Can. Deborah Chalk,  M.D.  Electronically Signed     SNT/MEDQ  D:  03/28/2007  T:  03/28/2007  Job:  [id number]   cc:   Georga Hacking, M.D.

## 2010-05-29 NOTE — Op Note (Signed)
[place]  Patient:    Jonathan Fischer, Jonathan Fischer                        MRN: [id number] Proc. Date: 02/25/99 Attending:  Artist Pais. Mina Marble, M.D.                           Operative Report  PREOPERATIVE DIAGNOSIS:  Retained hardware, right hand.  POSTOPERATIVE DIAGNOSIS:  Retained hardware, right hand.  PROCEDURE:  Removal of plate and screws, index and long finger metacarpals, right hand.  SURGEON:  Artist Pais. Mina Marble, M.D.  ANESTHESIA:  General.  TOURNIQUET TIME:  45 minutes.  COMPLICATIONS:  None.  DRAINS:  None.  DESCRIPTION OF PROCEDURE:  The patient was taken to the operating room and after the induction of adequate general anesthesia, the right upper extremity was prepped and draped in the usual sterile fashion.  An Esmarch was used to exsanguinate the limb.  The tourniquet was inflated to 250 mmHg.  At this point in time, using the previous incision for his internal fixation, the skin was incised longitudinally, and the interval between the index and long fingers was identified.  A subperiosteal dissection revealed plates and screws over both the index and long finger.  These were removed using the mini-fragment plate and screw set screwdriver.  The wounds were then thoroughly irrigated and closed in layers with #4-0 Vicryl and a running #3-0 Prolene subcutaneous stitch on the skin.  Steri-Strips, 4 x 4s, fluffs, and a compressive dressing were applied.  The patient tolerated the procedure well and went to the recovery room in stable fashion. DD:  09/02/99 TD:  09/02/99 Job: [id number]

## 2010-05-29 NOTE — Discharge Summary (Signed)
Jonathan Fischer, DUROSS NO.:  [id number]   MEDICAL RECORD NO.:  [id number]          PATIENT TYPE:  INP   LOCATION:  2029                         FACILITY:  [facility]   PHYSICIAN:  Raymon Mutton, P.A. DATE OF BIRTH:  04-Sep-1970   DATE OF ADMISSION:  10/30/2005  DATE OF DISCHARGE:  11/03/2005                                 DISCHARGE SUMMARY   DISCHARGE DIAGNOSES:  1. Status post acute myocardial infarction - anterior MI during this      admission treated with emergency catheterization and intervention to      the left anterior descending artery.  2. Non-sustained ventricular tachycardia post MI - no recurrence.  3. Tobacco use.  4. Hyperlipidemia.   PROCEDURE:  The patient underwent cardiac catheterization in an emergency  setting on October 20, s007 by Dr. Tresa Endo.  Cath revealed 100% proximal  occlusion of the LAD, 50-60% stenosis of the proximal RCA followed by 20-30%  stenosis mid-to-distal RCA.  Also, 30% stenosis in the mid-to-proximal  circumflex with a 20% mid-to-distal stenosis of the circumflex and 20%  irregularities in the proximal diagonal branch.   Dr. Tresa Endo performed PDA and placed a Cypher stent in the proximal LAD lesion  with excellent result.   HISTORY OF PRESENT ILLNESS AND HOSPITAL COURSE:  This is a 40 year old  Caucasian male, who presented to the emergency room as ST elevation and  as steamy.  We will ask to see patient emergently.  His EKG revealed  elevation the ST segment in precordial leads and reciprocal T inversion,  probable ischemic T inversion in lead 1 and AVL.  He was taken to the cath  lab emergently and underwent cardiac catheterization revealing total  occlusion of the proximal LAD and the patient had a balloon angioplasty and  placement Cypher stent in that vessel excellent results.  The procedure was  performed by Dr. Tresa Endo.   In the early post MI period, the patient had ventricular tachycardia, which  was non-sustained  and the patient was started on a beta blocker, Coreg,  while in the hospital and the dose was increased.  After dose adjustment,  there was no more recurrent v-tach.  His cath revealed possible apical  thrombus and the patient had a 2D echo on November 01, 2005.  The 2D echo did  not concern the diagnosis of LV thrombus and the size of the left ventricle  was normal.  Overall, the left ventricular systolic function was mildly  decreased with EF estimated around 45-50%, with moderate hypokinesis of the  apical, anteroseptal, and anterior wall, as well as mild-to-moderate  hyperkinesis of the mid distal septal wall and focally and focally in the  mid anterior wall.  No left ventricular thrombus identified.  Valvular  structures were normal.   The patient was kept in the hospital for post-MI observation until November 03, 2005.  He underwent consult for smoking cessation and Chantix was  started.  He also participated in cardiac rehab phase 1.   FAMILY HISTORY:  His father had coronary disease and open heart surgery in  his 74s.  The  mother is healthy.   PAST MEDICAL HISTORY:  The patient denied any known chronic illnesses.   SOCIAL HISTORY:  He is married and has 3 children, age 32, 35 and 57.  He does  not drink.  He does not use illicit drugs, but smokes tobacco.   LABORATORY DATA:  His BMP showed a sodium of 137, potassium 4.1, chloride  105; CO2 29, glucose 95, BUN 7; creatinine 1.0, INR 1.2.  CBC showed a white  blood cell count of 10.9; hemoglobin 13.6; hematocrit 39.2; platelet count  215,000.  His liver enzymes were elevated initially on the 21st, AST was 445  and the ALT 114.  The next day on November 01, 2005 AST was 138 and ALT 68.   Cardiac enzymes, first set:  CK 6,889; CK-MB 443.6; troponin greater than  100.  Second set:  CK 4,837; CK-MB 512; troponin greater than 100.  Third  set:  CK 2,975; CK-MB 283.2; and troponin greater than 100.   Lipid profile:  Total cholesterol  227, triglycerides 194, HDL 33, LDL 155,  TSH 0.644.   DISCHARGE MEDICATIONS:  1. Aspirin 325 mg daily.  2. Plavix 75 mg daily.  3. Lipitor 80 mg daily.  4. Protonix 40 mg daily.  5. Coreg 6.25 mg b.i.d.  6. Altace 2.5 mg b.i.d.  7. Chantix as directed.  8. Imdur 30 mg daily.  9. Nitroglycerin 0.4 mg as needed for chest pain.   DISCHARGE INSTRUCTIONS:  The patient was not allowed to return to work up  until he is seen by Dr. Tresa Endo in our office.   DISCHARGE DIET:  Low salt, low cholesterol diet.   He is not to drive for the next 3-5 days and no lifting greater than 5-10  pounds up until seen by Dr. Tresa Endo.   FOLLOWUP:  Followup appointment scheduled with Dr. Tresa Endo on November 25, 2005 at 8:45 a.m.      Raymon Mutton, P.A.     MK/MEDQ  D:  11/03/2005  T:  11/04/2005  Job:  [id number]   cc:   [facility]

## 2010-05-29 NOTE — Cardiovascular Report (Signed)
NAME:  Jonathan Fischer, Jonathan Fischer NO.:  [id number]   MEDICAL RECORD NO.:  [id number]          PATIENT TYPE:  OIB   LOCATION:  2899                         FACILITY:  [facility]   PHYSICIAN:  Nicki Guadalajara, M.D.     DATE OF BIRTH:  [birth year]   DATE OF PROCEDURE:  12/15/2005  DATE OF DISCHARGE:  12/14/2005                            CARDIAC CATHETERIZATION   INDICATIONS:  Jonathan Fischer is a 40 year old white male who  presented to [facility] on October 30, 2005, early into the  setting of an acute anterior wall myocardial infarction.  He was taken  emergently to cardiac catheterization laboratory and ultimately  underwent percutaneous coronary intervention with PTCA stenting of a  totally occluded proximal near-ostial LAD.  He had an early reperfusion  time of 1 hour 20 minutes from chest pain onset and had insertion of a  3.5 x 23 mm Cypher stent postdilated to 3.75 mm in the proximal LAD.  Subsequently he has done remarkably well.  Prior to return to work, he  did undergo a stress Myoview study which raised the possibility of LAD  ischemia although also demonstrated marked salvage of myocardium within  the LAD territory.  Prior to the return to work, definitive repeat  catheterization was recommended in light of ischemia seen on the Myoview  study.   PROCEDURE DESCRIPTION:  After premedication with Versed intravenously,  the patient was prepped and draped in usual fashion.  His right femoral  artery was punctured anteriorly and a 5-French sheath was inserted.  Diagnostic catheterization was done utilizing 5-French Judkins #4  catheters.  A Pigtail catheter was used for biplane cine left  ventriculography.  The patient tolerated the procedure well.   HEMODYNAMIC DATA:  Central aortic pressure was 103/72, mean 86.  Left  ventricular pressure was well 103/12.   ANGIOGRAPHIC DATA:  Left main coronary artery was angiographically  normal and bifurcated into an LAD and  left circumflex system.   The LAD had brisk TIMI-3 flow.  The stent in the proximal LAD was widely  patent without evidence for in-stent re-narrowing.  The stent ended  proximal to the first diagonal vessel.  There was TIMI-3 flow to the  distal LAD.  There did not appear to be any region of dissection.   The circumflex vessel was a moderate-size vessel that gave rise to a  high marginal almost ramus intermediate-like small to moderate vessel.  The vessel ended in the OM vessel and posterolateral vessel.  There were  minimal luminal irregularities of approximately 20% in the mid AV groove  circumflex.   The right coronary artery had mild luminal 20% narrowing in the proximal  third.  The vessel supplied an inferior LV branch, PDA, and also a  posterolateral-like vessel.  The remainder of the RCA was free of  significant disease.   Biplane cine left ventriculography revealed an ejection fraction of  approximately 50%.  There was mild residual mid anterolateral  hypocontractility on the RAO projection.  The LAO projection  contractility appeared fairly normal with ejection fraction of  approximately 55%.   IMPRESSION:  1. Low normal left ventricular function with mild residual mid distal      anterolateral hypocontractility, with an ejection fraction of      approximately 50-55%.  2. No evidence for restenosis in the widely patent proximal left      anterior descending artery stent at the site of recent anterior      wall myocardial infarction October 30, 2005, with TIMI-3 flow, no      evidence for dissection.  3. Mild luminal irregularities of 20% in the circumflex and right      coronary artery.   DISCUSSION:  Jonathan Fischer is now 6 weeks status post acute coronary  syndrome secondary to proximal LAD occlusion.  He had very early  reperfusion time of approximately 1 hour 20 minutes at the time of his  acute event.  Catheterization today reveals a widely patent stent with   significant recovery of LV function, with an ejection fraction of  approximately 50-55% without evidence for restenosis.  He will be given  clearance prior to return to his work, which has moderate physical  requirements.           ______________________________  Nicki Guadalajara, M.D.     TK/MEDQ  D:  12/14/2005  T:  12/15/2005  Job:  [id number]   cc:   Cardiac Catheterization Lab

## 2010-06-02 ENCOUNTER — Encounter: Payer: Self-pay | Admitting: Nurse Practitioner

## 2010-06-03 ENCOUNTER — Encounter: Payer: Self-pay | Admitting: Nurse Practitioner

## 2010-06-03 ENCOUNTER — Other Ambulatory Visit (INDEPENDENT_AMBULATORY_CARE_PROVIDER_SITE_OTHER): Payer: BC Managed Care – PPO | Admitting: *Deleted

## 2010-06-03 ENCOUNTER — Telehealth: Payer: Self-pay | Admitting: Cardiology

## 2010-06-03 ENCOUNTER — Other Ambulatory Visit: Payer: Self-pay | Admitting: Cardiology

## 2010-06-03 ENCOUNTER — Ambulatory Visit (INDEPENDENT_AMBULATORY_CARE_PROVIDER_SITE_OTHER): Payer: BC Managed Care – PPO | Admitting: Nurse Practitioner

## 2010-06-03 DIAGNOSIS — Z79899 Other long term (current) drug therapy: Secondary | ICD-10-CM

## 2010-06-03 DIAGNOSIS — E7800 Pure hypercholesterolemia, unspecified: Secondary | ICD-10-CM

## 2010-06-03 DIAGNOSIS — I509 Heart failure, unspecified: Secondary | ICD-10-CM

## 2010-06-03 DIAGNOSIS — R079 Chest pain, unspecified: Secondary | ICD-10-CM

## 2010-06-03 DIAGNOSIS — I2510 Atherosclerotic heart disease of native coronary artery without angina pectoris: Secondary | ICD-10-CM

## 2010-06-03 DIAGNOSIS — I25110 Atherosclerotic heart disease of native coronary artery with unstable angina pectoris: Secondary | ICD-10-CM | POA: Insufficient documentation

## 2010-06-03 DIAGNOSIS — I5022 Chronic systolic (congestive) heart failure: Secondary | ICD-10-CM | POA: Insufficient documentation

## 2010-06-03 DIAGNOSIS — I5020 Unspecified systolic (congestive) heart failure: Secondary | ICD-10-CM

## 2010-06-03 LAB — BASIC METABOLIC PANEL
BUN: 17 mg/dL (ref 6–23)
CO2: 28 mEq/L (ref 19–32)
Calcium: 9.2 mg/dL (ref 8.4–10.5)
Chloride: 105 mEq/L (ref 96–112)
Creatinine, Ser: 0.9 mg/dL (ref 0.4–1.5)
GFR: 97.56 mL/min (ref >60.00)
Glucose, Bld: 103 mg/dL — ABNORMAL HIGH (ref 70–99)
Potassium: 4.1 mEq/L (ref 3.5–5.1)
Sodium: 139 mEq/L (ref 135–145)

## 2010-06-03 LAB — LIPID PANEL
Cholesterol: 150 mg/dL (ref 0–200)
HDL: 34.6 mg/dL — ABNORMAL LOW (ref >39.00)
LDL Cholesterol: 100 mg/dL — ABNORMAL HIGH (ref 0–99)
Total CHOL/HDL Ratio: 4
Triglycerides: 78 mg/dL (ref 0.0–149.0)
VLDL: 15.6 mg/dL (ref 0.0–40.0)

## 2010-06-03 LAB — PROTIME-INR
INR: 1.1 ratio — ABNORMAL HIGH (ref 0.8–1.0)
Prothrombin Time: 12.2 s (ref 10.2–12.4)

## 2010-06-03 LAB — CBC WITH DIFFERENTIAL/PLATELET
Basophils Absolute: 0 10*3/uL (ref 0.0–0.1)
Basophils Relative: 0.4 % (ref 0.0–3.0)
Eosinophils Absolute: 0.1 10*3/uL (ref 0.0–0.7)
Eosinophils Relative: 1.4 % (ref 0.0–5.0)
HCT: 46.1 % (ref 39.0–52.0)
Hemoglobin: 16.1 g/dL (ref 13.0–17.0)
Lymphocytes Relative: 39.2 % (ref 12.0–46.0)
Lymphs Abs: 2.9 10*3/uL (ref 0.7–4.0)
MCHC: 34.9 g/dL (ref 30.0–36.0)
MCV: 94.7 fl (ref 78.0–100.0)
Monocytes Absolute: 0.5 10*3/uL (ref 0.1–1.0)
Monocytes Relative: 6.5 % (ref 3.0–12.0)
Neutro Abs: 3.9 10*3/uL (ref 1.4–7.7)
Neutrophils Relative %: 52.5 % (ref 43.0–77.0)
Platelets: 238 10*3/uL (ref 150.0–400.0)
RBC: 4.87 Mil/uL (ref 4.22–5.81)
RDW: 12.9 % (ref 11.5–14.6)
WBC: 7.4 10*3/uL (ref 4.5–10.5)

## 2010-06-03 LAB — HEPATIC FUNCTION PANEL
ALT: 46 U/L (ref 0–53)
AST: 29 U/L (ref 0–37)
Albumin: 4 g/dL (ref 3.5–5.2)
Alkaline Phosphatase: 57 U/L (ref 39–117)
Bilirubin, Direct: 0.2 mg/dL (ref 0.0–0.3)
Total Bilirubin: 0.9 mg/dL (ref 0.3–1.2)
Total Protein: 7.1 g/dL (ref 6.0–8.3)

## 2010-06-03 LAB — APTT: aPTT: 28.9 s — ABNORMAL HIGH (ref 21.7–28.8)

## 2010-06-03 NOTE — Progress Notes (Signed)
Jonathan Fischer Date of Birth: December 12, 1970   History of Present Illness: Jonathan Fischer is seen today for a work in visit. He is seen for Dr. Deborah Chalk. He has not been in the office since last October. He is beginning to have recurrent chest pain. He is also beginning to have more numbness and tingling in his hands and arms. He feels like he has had before his prior events. He has not been cathed since March of last year. He has an extensive history of CAD and now has LV dysfunction. He is not able to work. He is trying to get disability. He says he is not smoking. He is taking his medicines.   Current Outpatient Prescriptions on File Prior to Visit  Medication Sig Dispense Refill  . aspirin 325 MG tablet Take 325 mg by mouth daily.        [name] atorvastatin (LIPITOR) 80 MG tablet Take 80 mg by mouth daily.        [name] buPROPion (WELLBUTRIN SR) 150 MG 12 hr tablet Take 150 mg by mouth daily.        . Choline Fenofibrate (TRILIPIX) 135 MG capsule Take 135 mg by mouth daily.        . metoprolol succinate (TOPROL-XL) 25 MG 24 hr tablet Take 25 mg by mouth daily.        . nitroGLYCERIN (NITROSTAT) 0.4 MG SL tablet Place 0.4 mg under the tongue every 5 (five) minutes as needed.        [name] omeprazole (PRILOSEC) 20 MG capsule Take 20 mg by mouth daily.        . prasugrel (EFFIENT) 10 MG TABS Take 10 mg by mouth daily.        . ramipril (ALTACE) 10 MG tablet Take 10 mg by mouth daily.          No Known Allergies  Past Medical History  Diagnosis Date  . MI (myocardial infarction) 10/2005    ANTERIOR  . MI, acute, non ST segment elevation 04/2008    WITH INTERVENTION TO THE RIGHT CORONARY  . MI (myocardial infarction) 03/2009    ANTERIOR  . Obesity   . Hyperlipidemia   . Coronary artery disease   . LV dysfunction   . ICD (implantable cardiac defibrillator) in place     Past Surgical History  Procedure Date  . Cardiac catheterization 03/31/2009  . Cardiac catheterization 05/08/2008    CARDIAC SIZE AND  SILHOUETTE NORMAL. THERE WAS ANTERIOR APICAL HYPOKINESIS WITH EF 35-40%  . Coronary angioplasty     BALLOON ANGIOPLASTY TO THE LAD WITH THROMBUS EXTRACTION OF THE PREVIOUSLY STENTED SEGMENT AND NEW STENT PLACEMENT TO THE LEFT CIRCUMFLEX. EF IS DOWN IN THE 30% RANGE  . Cardiac defibrillator placement 11/2009    History  Smoking status  . Former Smoker -- 1.5 packs/day for 24 years  . Types: Cigarettes  . Quit date: 04/03/2010  Smokeless tobacco  . Never Used    History  Alcohol Use No    Family History  Problem Relation Age of Onset  . Cancer Mother   . Heart disease Father     Review of Systems: The review of systems is positive for chest pain. He has not been using NTG. He does take extra Aspirin with good response. He is not short of breath. He has been actively losing weight. He does not exercise. He reports no ICD discharges.  All other systems were reviewed and are negative.  Physical Exam: BP 114/76  Pulse 66  Ht 6\' 1"  (1.854 m)  Wt 254 lb 2 oz (115.27 kg)  BMI 33.53 kg/m2 Patient is pleasant and in no acute distress. Skin is warm and dry. Color is normal.  HEENT is unremarkable. Normocephalic/atraumatic. PERRL. Sclera are nonicteric. Neck is supple. No masses. No JVD. Lungs are clear. Cardiac exam shows a regular rate and rhythm. Abdomen is soft. Extremities are without edema. Gait and ROM are intact. No gross neurologic deficits noted.  LABORATORY DATA:  EKG shows old anterior MI with sinus rhythm today. Labs are pending.    Assessment / Plan:

## 2010-06-03 NOTE — Telephone Encounter (Signed)
Message copied by Earma Reading on Wed Jun 03, 2010  4:37 PM ------      Message from: Norma Fredrickson      Created: Wed Jun 03, 2010  4:03 PM       Ok to report. Labs are satisfactory.  Stay on same medicines. Recheck BMET/HPF/LIPIDS  in 6 months.      Will be cathed on Tuesday.

## 2010-06-03 NOTE — Assessment & Plan Note (Signed)
In light of his extensive history, we are going to proceed on with repeat cardiac cath. I have scheduled this with Dr. Swaziland for next Tuesday. He is to go to the ER if any problems arise in the interim. His current medicines are continued. He will stay on his Effient. Patient is agreeable to this plan and will call if any problems develop in the interim.

## 2010-06-03 NOTE — Telephone Encounter (Signed)
Call to report lab results. Phone number provided does not accept in coming calls. This was the only number provided [phone]

## 2010-06-03 NOTE — Patient Instructions (Addendum)
Stay on current medicines. We are going to arrange for a cardiac catheterization and repeat your ultrasound of your heart. You have to go to [facility] for a CXR tomorrow. This is at [facility]. First floor. Go any time between 9 and 4  Show up on Tuesday May 29th at [facility] at 5:30 am Go to Short Stay on the 2nd floor at 5:30 am Do not eat or drink after midnight on Monday. You may take your medicines with a sip of water that morning.  You will have your ultrasound on June the 1st at 7:30am (Friday). Arrive at 7:15 am. They will charge you if you do not show up. This will be at [facility] at [facility]. 3rd floor.

## 2010-06-04 ENCOUNTER — Ambulatory Visit
Admission: RE | Admit: 2010-06-04 | Discharge: 2010-06-04 | Disposition: A | Discharge status: Home / Self Care | Payer: BC Managed Care – PPO | Source: Ambulatory Visit | Attending: Nurse Practitioner | Admitting: Nurse Practitioner

## 2010-06-04 ENCOUNTER — Encounter: Payer: BC Managed Care – PPO | Admitting: *Deleted

## 2010-06-09 ENCOUNTER — Ambulatory Visit (HOSPITAL_COMMUNITY)
Admission: RE | Admit: 2010-06-09 | Discharge: 2010-06-09 | Disposition: A | Discharge status: Home / Self Care | Payer: BC Managed Care – PPO | Source: Ambulatory Visit | Attending: Cardiology | Admitting: Cardiology

## 2010-06-09 DIAGNOSIS — R079 Chest pain, unspecified: Secondary | ICD-10-CM | POA: Insufficient documentation

## 2010-06-09 DIAGNOSIS — Z7982 Long term (current) use of aspirin: Secondary | ICD-10-CM | POA: Insufficient documentation

## 2010-06-09 DIAGNOSIS — Z95810 Presence of automatic (implantable) cardiac defibrillator: Secondary | ICD-10-CM | POA: Insufficient documentation

## 2010-06-09 DIAGNOSIS — I5022 Chronic systolic (congestive) heart failure: Secondary | ICD-10-CM | POA: Insufficient documentation

## 2010-06-09 DIAGNOSIS — Z7902 Long term (current) use of antithrombotics/antiplatelets: Secondary | ICD-10-CM | POA: Insufficient documentation

## 2010-06-09 DIAGNOSIS — Z79899 Other long term (current) drug therapy: Secondary | ICD-10-CM | POA: Insufficient documentation

## 2010-06-09 DIAGNOSIS — I2510 Atherosclerotic heart disease of native coronary artery without angina pectoris: Secondary | ICD-10-CM | POA: Insufficient documentation

## 2010-06-09 DIAGNOSIS — I2589 Other forms of chronic ischemic heart disease: Secondary | ICD-10-CM | POA: Insufficient documentation

## 2010-06-09 DIAGNOSIS — I509 Heart failure, unspecified: Secondary | ICD-10-CM | POA: Insufficient documentation

## 2010-06-10 ENCOUNTER — Telehealth: Payer: Self-pay | Admitting: *Deleted

## 2010-06-10 ENCOUNTER — Other Ambulatory Visit: Payer: Self-pay

## 2010-06-10 NOTE — Telephone Encounter (Signed)
LMOM to call back regarding CXR report

## 2010-06-11 ENCOUNTER — Ambulatory Visit (INDEPENDENT_AMBULATORY_CARE_PROVIDER_SITE_OTHER): Payer: BC Managed Care – PPO | Admitting: *Deleted

## 2010-06-11 ENCOUNTER — Telehealth: Payer: Self-pay | Admitting: *Deleted

## 2010-06-11 ENCOUNTER — Encounter: Payer: BC Managed Care – PPO | Admitting: *Deleted

## 2010-06-11 DIAGNOSIS — I428 Other cardiomyopathies: Secondary | ICD-10-CM

## 2010-06-11 NOTE — Telephone Encounter (Signed)
Pt's wife notified of chest x-ray results.   

## 2010-06-11 NOTE — Telephone Encounter (Signed)
Message copied by Adolphus Birchwood on Thu Jun 11, 2010 11:49 AM ------      Message from: Rosalio Macadamia      Created: Fri Jun 05, 2010  8:02 AM       Ok to report. Chest X ray is satisfactory.

## 2010-06-12 ENCOUNTER — Other Ambulatory Visit (HOSPITAL_COMMUNITY): Payer: Self-pay | Admitting: Radiology

## 2010-06-12 ENCOUNTER — Ambulatory Visit (HOSPITAL_COMMUNITY): Payer: BC Managed Care – PPO | Attending: Cardiology | Admitting: Radiology

## 2010-06-12 DIAGNOSIS — I252 Old myocardial infarction: Secondary | ICD-10-CM | POA: Insufficient documentation

## 2010-06-12 DIAGNOSIS — I2510 Atherosclerotic heart disease of native coronary artery without angina pectoris: Secondary | ICD-10-CM | POA: Insufficient documentation

## 2010-06-12 DIAGNOSIS — I080 Rheumatic disorders of both mitral and aortic valves: Secondary | ICD-10-CM | POA: Insufficient documentation

## 2010-06-12 DIAGNOSIS — I079 Rheumatic tricuspid valve disease, unspecified: Secondary | ICD-10-CM | POA: Insufficient documentation

## 2010-06-12 DIAGNOSIS — F17200 Nicotine dependence, unspecified, uncomplicated: Secondary | ICD-10-CM | POA: Insufficient documentation

## 2010-06-12 MED ORDER — PERFLUTREN PROTEIN A MICROSPH IV SUSP
3.0000 mL | Freq: Once | INTRAVENOUS | Status: AC
  Administered 2010-06-12: 3 mL via INTRAVENOUS

## 2010-06-17 ENCOUNTER — Telehealth: Payer: Self-pay | Admitting: *Deleted

## 2010-06-17 NOTE — Progress Notes (Signed)
ICD REMOTE  

## 2010-06-17 NOTE — Telephone Encounter (Signed)
Message copied by Antony Odea on Wed Jun 17, 2010 11:12 AM ------      Message from: Roger Shelter      Created: Wed Jun 17, 2010  8:35 AM       Ok.

## 2010-06-17 NOTE — Cardiovascular Report (Signed)
  NAME:  Jonathan Fischer, Jonathan Fischer NO.:  [id number]  MEDICAL RECORD NO.:  [id number]           PATIENT TYPE:  O  LOCATION:  6522                         FACILITY:  [facility]  PHYSICIAN:  Peter M. Swaziland, M.D.  DATE OF BIRTH:  April 26, 1970  DATE OF PROCEDURE:  06/09/2010 DATE OF DISCHARGE:                           CARDIAC CATHETERIZATION   INDICATIONS FOR PROCEDURE:  The patient is a 40 year old white male with history of extensive coronary artery disease.  He has had prior stenting of the proximal LAD, mid circumflex and mid right coronary artery.  He presents with symptoms of recurrent chest pain concerning for angina. He also has a history of ischemic cardiomyopathy and status post ICD implant.  PROCEDURE:  Left heart catheterization, coronary and left ventricular angiography access via the right radial artery using standard Seldinger technique.  EQUIPMENT:  A 6-French 4-cm right Judkins catheter, 6-French 3.5-cm left Judkins catheter, 6-French pigtail catheter, 6-French arterial sheath.  MEDICATIONS: 1. Local anesthesia with 1% Xylocaine. 2. Versed 2 mg IV. 3. Fentanyl 25 mcg IV. 4. Heparin bolus of 4000 units IV. 5. Verapamil 3 mg intra-arterial.  CONTRAST:  Omnipaque 90 mL.  HEMODYNAMIC DATA:  Aortic pressure is 99/65 with a mean of 80 mmHg. Left ventricle pressure is 98 with EDP of 18 mmHg.  ANGIOGRAPHIC DATA:  The right coronary arises and distributes normally. It is a dominant vessel.  It has less than or equal to 10% irregularities throughout.  The stent in the midvessel was widely patent.  The left main coronary is normal.  The left anterior descending artery demonstrates a stent in the proximal vessel.  The stent has a very irregular contour with 20% in-stent stenosis.  The remainder of the vessels without significant disease.  The left circumflex coronary artery has a smooth 20% narrowing in the mid vessel prior to the stent placement.  The stent  itself is widely patent.  Left ventricular angiography demonstrates enlarged left ventricular chamber size with anterolateral, apical, and inferior apical akinesis. Ejection fraction is estimated at 30%.  FINAL INTERPRETATION: 1. Nonobstructive atherosclerotic coronary artery disease.  The prior     stents all appear to be patent. 2. Severe left ventricular dysfunction.  PLAN:  We will continue with medical therapy.          ______________________________ Peter M. Swaziland, M.D.     PMJ/MEDQ  D:  06/09/2010  T:  06/09/2010  Job:  [id number]  cc:   Colleen Can. Deborah Chalk, M.D.  Electronically Signed by PETER Swaziland M.D. on 06/17/2010 06:06:06 PM

## 2010-06-22 ENCOUNTER — Ambulatory Visit: Payer: BC Managed Care – PPO | Admitting: Nurse Practitioner

## 2010-06-22 ENCOUNTER — Telehealth: Payer: Self-pay | Admitting: *Deleted

## 2010-06-22 NOTE — Telephone Encounter (Signed)
Missed app w/ lori gerhardt np letter sent

## 2010-07-12 ENCOUNTER — Encounter: Payer: Self-pay | Admitting: *Deleted

## 2010-09-10 ENCOUNTER — Telehealth: Payer: Self-pay | Admitting: Nurse Practitioner

## 2010-09-10 ENCOUNTER — Encounter: Payer: BC Managed Care – PPO | Admitting: *Deleted

## 2010-09-10 NOTE — Telephone Encounter (Signed)
Attorney for the following patient is calling to request information about the pt's limitations/restrictions.  Attorney was informed to have an authorization release signed by pt and faxed to [place] before the nurse can speak about anything.  Chart given to [facility].

## 2010-09-11 NOTE — Telephone Encounter (Signed)
lm

## 2010-09-11 NOTE — Telephone Encounter (Signed)
Spoke w/Debbie Karin Lieu re Mr. Kniskern. She wanted to know extent of his limitations. Lawson Fiscal also spoke w/ her. Faxed note to [phone]; stating he is not able to work 8 hrs per day due to severe LV dysfunction and ischemic cardiomyopathy. Not able to sustain a 40 hr work week even at [employer] duty.

## 2010-09-15 ENCOUNTER — Encounter: Payer: Self-pay | Admitting: *Deleted

## 2010-09-21 ENCOUNTER — Other Ambulatory Visit: Payer: Self-pay | Admitting: Internal Medicine

## 2010-09-21 ENCOUNTER — Encounter: Payer: Self-pay | Admitting: Internal Medicine

## 2010-09-21 ENCOUNTER — Ambulatory Visit (INDEPENDENT_AMBULATORY_CARE_PROVIDER_SITE_OTHER): Payer: BC Managed Care – PPO | Admitting: *Deleted

## 2010-09-21 DIAGNOSIS — I428 Other cardiomyopathies: Secondary | ICD-10-CM

## 2010-09-21 DIAGNOSIS — I509 Heart failure, unspecified: Secondary | ICD-10-CM

## 2010-09-23 LAB — REMOTE ICD DEVICE
DEV-0020ICD: NEGATIVE
FVT: 0
PACEART VT: 0
TOT-0001: 1
TOT-0002: 0
TOT-0006: 20111102000000
TZAT-0002SLOWVT: NEGATIVE
TZAT-0018SLOWVT: NEGATIVE
TZAT-0019FASTVT: 8 V
TZAT-0019SLOWVT: 8 V
TZAT-0020FASTVT: 1.5 ms
TZON-0003SLOWVT: 360 ms
TZON-0004VSLOWVT: 32
TZON-0005SLOWVT: 12
TZST-0001FASTVT: 2
TZST-0001FASTVT: 3
TZST-0001FASTVT: 5
TZST-0001SLOWVT: 5
TZST-0001SLOWVT: 6
TZST-0002FASTVT: NEGATIVE
TZST-0002FASTVT: NEGATIVE
TZST-0002SLOWVT: NEGATIVE
TZST-0002SLOWVT: NEGATIVE
TZST-0002SLOWVT: NEGATIVE
VENTRICULAR PACING ICD: 0.01 pct

## 2010-09-28 ENCOUNTER — Other Ambulatory Visit: Payer: Self-pay | Admitting: Nurse Practitioner

## 2010-09-28 ENCOUNTER — Other Ambulatory Visit: Payer: Self-pay | Admitting: Cardiology

## 2010-09-28 NOTE — Progress Notes (Signed)
icd remote check  

## 2010-09-29 NOTE — Telephone Encounter (Signed)
Refilled Meds from fax  

## 2010-10-05 LAB — CARDIAC PANEL(CRET KIN+CKTOT+MB+TROPI)
Relative Index: 1.7
Relative Index: 1.7
Relative Index: 2.2
Total CK: 142
Total CK: 193
Troponin I: 0.09 — ABNORMAL HIGH
Troponin I: 0.12 — ABNORMAL HIGH
Troponin I: 0.18 — ABNORMAL HIGH

## 2010-10-05 LAB — I-STAT EC8
BUN: 15
Bicarbonate: 26.6 — ABNORMAL HIGH
Glucose, Bld: 129 — ABNORMAL HIGH
TCO2: 28
pH, Arterial: 7.288 — ABNORMAL LOW

## 2010-10-05 LAB — CBC
HCT: 45.6
MCV: 92.3
MCV: 92.7
Platelets: 225
Platelets: 287
RBC: 4.74
RBC: 4.94
WBC: 11.2 — ABNORMAL HIGH
WBC: 9.1

## 2010-10-05 LAB — BASIC METABOLIC PANEL
CO2: 29
Chloride: 103
Creatinine, Ser: 0.91
GFR calc Af Amer: >60
Potassium: 4.2

## 2010-10-05 LAB — POCT I-STAT CREATININE
Creatinine, Ser: 1
Creatinine, Ser: 1

## 2010-10-05 LAB — POCT I-STAT 3, ART BLOOD GAS (G3+)
Acid-Base Excess: 2
Bicarbonate: 29.7 — ABNORMAL HIGH
O2 Saturation: 95
TCO2: 31
pCO2 arterial: 56.2 — ABNORMAL HIGH
pO2, Arterial: 84

## 2010-10-05 LAB — I-STAT 8, (EC8 V) (CONVERTED LAB)
Acid-Base Excess: 1
Chloride: 105
HCT: 48
Hemoglobin: 16.3
Operator id: 234501
Potassium: 4.4
Sodium: 137
pH, Ven: 7.44 — ABNORMAL HIGH

## 2010-10-05 LAB — LIPID PANEL
Cholesterol: 161
HDL: 24 — ABNORMAL LOW
LDL Cholesterol: 80
Total CHOL/HDL Ratio: 6.7
VLDL: 57 — ABNORMAL HIGH

## 2010-10-05 LAB — POCT CARDIAC MARKERS: Operator id: 234501

## 2010-10-05 LAB — DIFFERENTIAL
Eosinophils Absolute: 0.1
Eosinophils Relative: 1
Lymphocytes Relative: 23
Lymphs Abs: 2.1
Monocytes Relative: 5

## 2010-10-08 ENCOUNTER — Encounter: Payer: Self-pay | Admitting: *Deleted

## 2010-12-09 ENCOUNTER — Other Ambulatory Visit: Payer: Self-pay | Admitting: Cardiology

## 2011-02-12 ENCOUNTER — Encounter: Payer: Self-pay | Admitting: *Deleted

## 2011-02-24 ENCOUNTER — Telehealth: Payer: Self-pay | Admitting: Internal Medicine

## 2011-02-24 NOTE — Telephone Encounter (Signed)
02-24-11 LMM @ 939A pt to cal lto set up pacemaker ck with klein/mt

## 2011-02-25 ENCOUNTER — Encounter: Payer: Self-pay | Admitting: Internal Medicine

## 2011-02-25 ENCOUNTER — Ambulatory Visit (INDEPENDENT_AMBULATORY_CARE_PROVIDER_SITE_OTHER): Payer: BC Managed Care – PPO | Admitting: *Deleted

## 2011-02-25 DIAGNOSIS — I509 Heart failure, unspecified: Secondary | ICD-10-CM

## 2011-02-25 DIAGNOSIS — I5022 Chronic systolic (congestive) heart failure: Secondary | ICD-10-CM

## 2011-02-25 LAB — ICD DEVICE OBSERVATION
BRDY-0002RV: 40 {beats}/min
DEV-0020ICD: NEGATIVE
RV LEAD THRESHOLD: 0.75 V
TZAT-0001FASTVT: 1
TZAT-0001SLOWVT: 1
TZAT-0002FASTVT: NEGATIVE
TZAT-0002SLOWVT: NEGATIVE
TZAT-0012FASTVT: 200 ms
TZAT-0012SLOWVT: 200 ms
TZAT-0018FASTVT: NEGATIVE
TZAT-0019FASTVT: 8 V
TZON-0003VSLOWVT: 360 ms
TZON-0004SLOWVT: 16
TZON-0004VSLOWVT: 32
TZON-0005SLOWVT: 12
TZST-0001FASTVT: 4
TZST-0001FASTVT: 5
TZST-0001FASTVT: 6
TZST-0001SLOWVT: 2
TZST-0001SLOWVT: 4
TZST-0002FASTVT: NEGATIVE
TZST-0002FASTVT: NEGATIVE
TZST-0002SLOWVT: NEGATIVE
TZST-0002SLOWVT: NEGATIVE
TZST-0002SLOWVT: NEGATIVE

## 2011-02-25 NOTE — Progress Notes (Signed)
ICD check 

## 2011-04-16 ENCOUNTER — Telehealth: Payer: Self-pay | Admitting: Internal Medicine

## 2011-04-16 MED ORDER — FUROSEMIDE 20 MG PO TABS: 20.0000 mg | ORAL_TABLET | Freq: Every day | ORAL | Status: DC

## 2011-04-16 NOTE — Telephone Encounter (Signed)
Discussed pt issues with lori gerhardt np, script for furosemide 20 mg sent to pharm. appt scheduled for follow up next week. Pt will bring all meds to that appt.

## 2011-04-16 NOTE — Telephone Encounter (Signed)
New Msg: Pt wife calling wanting to speak with nurse/MD about pt swelling in his pt ankles. Pt wife stated when she pushes on ankles the skin does not retract, rather a dent remains in the skin. Pt ankles have been swollen since yesterday. Please return pt wife call to discuss/advise further.

## 2011-04-16 NOTE — Telephone Encounter (Signed)
Spoke with pt wife, she reports the pt has had congestion for about two weeks. He took musconex and it got better. He is now congested again, he has a cough, productive but uncertain if has any color to mucus. He also hears rattling when he lies down, he is having trouble breathing lying flat but thinks maybe related to mucus. Yesterday they noticed his ankles were swollen over his socks, he was able to wear shoes. He propped them up over night and they are some better today. The left is greater than the right. If you press on the edema it will leave a dent. He denies any cp or heartburn. He is SOB. He has been seen in awhile. Will discuss with lori gerhardt np.

## 2011-04-20 ENCOUNTER — Ambulatory Visit (INDEPENDENT_AMBULATORY_CARE_PROVIDER_SITE_OTHER): Payer: BC Managed Care – PPO | Admitting: Nurse Practitioner

## 2011-04-20 ENCOUNTER — Encounter: Payer: Self-pay | Admitting: Nurse Practitioner

## 2011-04-20 VITALS — BP 144/88 | HR 72 | Ht 73.0 in | Wt 286.0 lb

## 2011-04-20 DIAGNOSIS — Z95810 Presence of automatic (implantable) cardiac defibrillator: Secondary | ICD-10-CM

## 2011-04-20 DIAGNOSIS — I5021 Acute systolic (congestive) heart failure: Secondary | ICD-10-CM

## 2011-04-20 DIAGNOSIS — I2510 Atherosclerotic heart disease of native coronary artery without angina pectoris: Secondary | ICD-10-CM

## 2011-04-20 DIAGNOSIS — I5022 Chronic systolic (congestive) heart failure: Secondary | ICD-10-CM

## 2011-04-20 DIAGNOSIS — I509 Heart failure, unspecified: Secondary | ICD-10-CM

## 2011-04-20 LAB — BRAIN NATRIURETIC PEPTIDE: Pro B Natriuretic peptide (BNP): 10 pg/mL (ref 0.0–100.0)

## 2011-04-20 LAB — BASIC METABOLIC PANEL
BUN: 16 mg/dL (ref 6–23)
CO2: 27 mEq/L (ref 19–32)
Calcium: 9.1 mg/dL (ref 8.4–10.5)
Chloride: 105 mEq/L (ref 96–112)
Creatinine, Ser: 1 mg/dL (ref 0.4–1.5)
GFR: 92.46 mL/min (ref >60.00)
Glucose, Bld: 155 mg/dL — ABNORMAL HIGH (ref 70–99)
Potassium: 3.8 mEq/L (ref 3.5–5.1)
Sodium: 142 mEq/L (ref 135–145)

## 2011-04-20 MED ORDER — SPIRONOLACTONE 25 MG PO TABS: 25.0000 mg | ORAL_TABLET | Freq: Every day | ORAL | Status: DC

## 2011-04-20 NOTE — Assessment & Plan Note (Signed)
He presents today with worsening heart failure symptoms. I have added aldactone 25 mg daily. Will leave him on the Lasix. He is on ACE and just low dose beta blocker. We have room to titrate his medicines with his blood pressure. We are checking BMET and BNP today. We are going to update his echo. I have encouraged him to cut out the salt, weigh daily and try to work on his smoking. We will try to optimize his medicines. We did discuss possible CHF clinic referral as well as the possibility of transplant due to his young age. We do need his cooperation if he wants to do well. He seems motivated. We will check a BMET in one week and I will see him back in 2 weeks. Patient is agreeable to this plan and will call if any problems develop in the interim.

## 2011-04-20 NOTE — Assessment & Plan Note (Signed)
No current chest pain at this time. He does remain on his Effient.

## 2011-04-20 NOTE — Progress Notes (Signed)
[name] Date of Birth: 1970-12-14 Medical Record #[id number]  History of Present Illness: Jonathan Fischer is seen today for a work in visit. He is seen for Jonathan Fischer. He is a former patient of Jonathan Fischer. He has extensive CAD with prior MI's and prior stents. Last cath was last May per Jonathan Fischer. ICD has been implanted due to LV dysfunction. His other problems include HTN, tobacco abuse, obesity, HLD and depression. Last echo almost one year ago showed an EF of 35%.  He comes in today. He is here with his wife. He is not doing well. He called on Friday and was complaining of congestion for the past week. Feels rattling in his chest when he lies down. Has PND. Has a cough and is bringing up some frothy sputum. Belly feels bloated. He has some swelling in his legs. We started him on Lasix over the weekend. He comes in today for follow up. He has no chest pain. He is back smoking. Says he has gotten off track with his diet. His weight is up 30 pounds in the past year. He says he is taking his medicines. No ICD shocks reported.  Current Outpatient Prescriptions on File Prior to Visit  Medication Sig Dispense Refill  . aspirin 325 MG tablet Take 325 mg by mouth daily.        [name] buPROPion (WELLBUTRIN SR) 150 MG 12 hr tablet Take 150 mg by mouth daily.        [name] EFFIENT 10 MG TABS TAKE ONE TABLET BY MOUTH EVERY DAY  30 each  5  . furosemide (LASIX) 20 MG tablet Take 1 tablet (20 mg total) by mouth daily.  30 tablet  11  . LIPITOR 80 MG tablet TAKE ONE TABLET BY MOUTH EVERY DAY  30 each  5  . metoprolol succinate (TOPROL-XL) 25 MG 24 hr tablet TAKE ONE TABLET BY MOUTH EVERY DAY  30 tablet  5  . nitroGLYCERIN (NITROSTAT) 0.4 MG SL tablet DISSOLVE ONE TABLET UNDER THE TONGUE AS NEEDED  25 tablet  11  . omeprazole (PRILOSEC) 20 MG capsule TAKE ONE CAPSULE BY MOUTH EVERY DAY  30 capsule  5  . ramipril (ALTACE) 10 MG capsule TAKE ONE CAPSULE BY MOUTH EVERY DAY  30 capsule  5  . TRILIPIX 135 MG capsule TAKE  ONE CAPSULE BY MOUTH EVERY DAY  30 each  5  . spironolactone (ALDACTONE) 25 MG tablet Take 1 tablet (25 mg total) by mouth daily.  30 tablet  6  . DISCONTD: buPROPion (WELLBUTRIN XL) 150 MG 24 hr tablet TAKE ONE TABLET BY MOUTH EVERY DAY  30 tablet  5  . DISCONTD: ramipril (ALTACE) 10 MG tablet Take 10 mg by mouth daily.          No Known Allergies  Past Medical History  Diagnosis Date  . MI (myocardial infarction) 10/2005    ANTERIOR  . MI, acute, non ST segment elevation 04/2008    WITH INTERVENTION TO THE RIGHT CORONARY  . MI (myocardial infarction) 03/2009    ANTERIOR  . Obesity   . Hyperlipidemia   . Coronary artery disease   . LV dysfunction   . ICD (implantable cardiac defibrillator) in place   . Tobacco abuse   . Systolic heart failure     Past Surgical History  Procedure Date  . Cardiac catheterization 03/31/2009  . Cardiac catheterization 05/08/2008    CARDIAC SIZE AND SILHOUETTE NORMAL. THERE WAS ANTERIOR APICAL HYPOKINESIS WITH EF 35-40%  .  Coronary angioplasty     BALLOON ANGIOPLASTY TO THE LAD WITH THROMBUS EXTRACTION OF THE PREVIOUSLY STENTED SEGMENT AND NEW STENT PLACEMENT TO THE LEFT CIRCUMFLEX. EF IS DOWN IN THE 30% RANGE  . Cardiac defibrillator placement 11/2009    History  Smoking status  . Current Everyday Smoker -- 1.5 packs/day for 24 years  . Types: Cigarettes  Smokeless tobacco  . Never Used    History  Alcohol Use No    Family History  Problem Relation Age of Onset  . Cancer Mother   . Heart disease Father     Review of Systems: The review of systems is per the HPI.  All other systems were reviewed and are negative.  Physical Exam: BP 144/88  Pulse 72  Ht 6\' 1"  (1.854 m)  Wt 286 lb (129.729 kg)  BMI 37.73 kg/m2 Patient is pleasant and in no acute distress. He is obese. Skin is warm and dry. Color is normal.  HEENT is unremarkable. Normocephalic/atraumatic. PERRL. Sclera are nonicteric. Neck is supple. No masses. No JVD. Lungs are  clear. Cardiac exam shows a regular rate and rhythm. May have an S3.  Abdomen is obese and seems a little firm. Extremities are with 1+ edema. Gait and ROM are intact. No gross neurologic deficits noted.  LABORATORY DATA: PENDING   Assessment / Plan:

## 2011-04-20 NOTE — Patient Instructions (Signed)
We are going to check your labs today  We are going to add Aldactone 25 mg each day  Stay on the other medicines  Weigh each day.  Limit your salt  Recheck a BMET in one week  I will see you in 2 weeks  Call the [facility] office at [phone] if you have any questions, problems or concerns.

## 2011-04-20 NOTE — Assessment & Plan Note (Signed)
No shocks reported. Has his visit with Dr. Graciela Husbands next month.

## 2011-04-27 ENCOUNTER — Ambulatory Visit (HOSPITAL_COMMUNITY): Payer: BC Managed Care – PPO | Attending: Cardiology

## 2011-04-27 ENCOUNTER — Other Ambulatory Visit (INDEPENDENT_AMBULATORY_CARE_PROVIDER_SITE_OTHER): Payer: BC Managed Care – PPO

## 2011-04-27 ENCOUNTER — Other Ambulatory Visit: Payer: Self-pay

## 2011-04-27 DIAGNOSIS — R609 Edema, unspecified: Secondary | ICD-10-CM | POA: Insufficient documentation

## 2011-04-27 DIAGNOSIS — Z79899 Other long term (current) drug therapy: Secondary | ICD-10-CM

## 2011-04-27 DIAGNOSIS — I2510 Atherosclerotic heart disease of native coronary artery without angina pectoris: Secondary | ICD-10-CM | POA: Insufficient documentation

## 2011-04-27 DIAGNOSIS — I10 Essential (primary) hypertension: Secondary | ICD-10-CM | POA: Insufficient documentation

## 2011-04-27 DIAGNOSIS — I059 Rheumatic mitral valve disease, unspecified: Secondary | ICD-10-CM | POA: Insufficient documentation

## 2011-04-27 DIAGNOSIS — I517 Cardiomegaly: Secondary | ICD-10-CM | POA: Insufficient documentation

## 2011-04-27 DIAGNOSIS — Z8249 Family history of ischemic heart disease and other diseases of the circulatory system: Secondary | ICD-10-CM | POA: Insufficient documentation

## 2011-04-27 DIAGNOSIS — E785 Hyperlipidemia, unspecified: Secondary | ICD-10-CM | POA: Insufficient documentation

## 2011-04-27 DIAGNOSIS — I252 Old myocardial infarction: Secondary | ICD-10-CM | POA: Insufficient documentation

## 2011-04-27 DIAGNOSIS — I5021 Acute systolic (congestive) heart failure: Secondary | ICD-10-CM

## 2011-04-27 DIAGNOSIS — I509 Heart failure, unspecified: Secondary | ICD-10-CM | POA: Insufficient documentation

## 2011-04-27 DIAGNOSIS — F17200 Nicotine dependence, unspecified, uncomplicated: Secondary | ICD-10-CM | POA: Insufficient documentation

## 2011-04-27 LAB — HEPATIC FUNCTION PANEL
ALT: 66 U/L — ABNORMAL HIGH (ref 0–53)
AST: 42 U/L — ABNORMAL HIGH (ref 0–37)
Albumin: 4.1 g/dL (ref 3.5–5.2)
Total Protein: 7.5 g/dL (ref 6.0–8.3)

## 2011-04-27 LAB — LIPID PANEL
Cholesterol: 136 mg/dL (ref 0–200)
HDL: 32.2 mg/dL — ABNORMAL LOW (ref >39.00)
Triglycerides: 108 mg/dL (ref 0.0–149.0)

## 2011-04-27 LAB — BASIC METABOLIC PANEL
Calcium: 9.1 mg/dL (ref 8.4–10.5)
GFR: 86.2 mL/min (ref >60.00)
Glucose, Bld: 143 mg/dL — ABNORMAL HIGH (ref 70–99)
Sodium: 139 mEq/L (ref 135–145)

## 2011-05-06 ENCOUNTER — Encounter: Payer: Self-pay | Admitting: Nurse Practitioner

## 2011-05-06 ENCOUNTER — Ambulatory Visit (INDEPENDENT_AMBULATORY_CARE_PROVIDER_SITE_OTHER): Payer: BC Managed Care – PPO | Admitting: Nurse Practitioner

## 2011-05-06 DIAGNOSIS — I5020 Unspecified systolic (congestive) heart failure: Secondary | ICD-10-CM

## 2011-05-06 DIAGNOSIS — I509 Heart failure, unspecified: Secondary | ICD-10-CM

## 2011-05-06 DIAGNOSIS — I5022 Chronic systolic (congestive) heart failure: Secondary | ICD-10-CM

## 2011-05-06 LAB — BASIC METABOLIC PANEL
BUN: 17 mg/dL (ref 6–23)
CO2: 26 mEq/L (ref 19–32)
Calcium: 9.1 mg/dL (ref 8.4–10.5)
Chloride: 103 mEq/L (ref 96–112)
Creatinine, Ser: 1.1 mg/dL (ref 0.4–1.5)
GFR: 83.35 mL/min (ref >60.00)
Glucose, Bld: 102 mg/dL — ABNORMAL HIGH (ref 70–99)
Potassium: 4 mEq/L (ref 3.5–5.1)
Sodium: 138 mEq/L (ref 135–145)

## 2011-05-06 NOTE — Progress Notes (Signed)
Jonathan Fischer Date of Birth: July 16, 1970 Medical Record #[id number]  History of Present Illness: Jonathan Fischer is seen back today for a 2 week check. He is seen for Dr. [place]. He has an extensive history of CAD with prior MI's and prior stents. Last cath was in May of 2012. He has an ICD in place due to LV dysfunction. Other issues include ongoing tobacco abuse, HTN, obesity, HLD and depression. I saw him 2 weeks ago after almost a year's absence. He had gotten way off track with his health. Back smoking. Weight was up. Very short of breath, coughing frothy sputum. Felt bloated and his legs were swelling. We added Lasix and then aldactone. Updated his echo which fortunately, shows no worsening of his EF. His BNP was very surprisingly low at 10.   He comes in today. He is here with his wife. He is doing better. Weight is down 7 pounds. No chest pain. Coughing has stopped. Not bloated. No swelling. Still smoking but trying to taper down. Using less salt. No chest pain. He is taking his medicines.   Current Outpatient Prescriptions on File Prior to Visit  Medication Sig Dispense Refill  . aspirin 325 MG tablet Take 325 mg by mouth daily.        Jonathan Fischer buPROPion (WELLBUTRIN SR) 150 MG 12 hr tablet Take 150 mg by mouth daily.        Jonathan Fischer EFFIENT 10 MG TABS TAKE ONE TABLET BY MOUTH EVERY DAY  30 each  5  . furosemide (LASIX) 20 MG tablet Take 1 tablet (20 mg total) by mouth daily.  30 tablet  11  . LIPITOR 80 MG tablet TAKE ONE TABLET BY MOUTH EVERY DAY  30 each  5  . metoprolol succinate (TOPROL-XL) 25 MG 24 hr tablet TAKE ONE TABLET BY MOUTH EVERY DAY  30 tablet  5  . nitroGLYCERIN (NITROSTAT) 0.4 MG SL tablet DISSOLVE ONE TABLET UNDER THE TONGUE AS NEEDED  25 tablet  11  . omeprazole (PRILOSEC) 20 MG capsule TAKE ONE CAPSULE BY MOUTH EVERY DAY  30 capsule  5  . ramipril (ALTACE) 10 MG capsule TAKE ONE CAPSULE BY MOUTH EVERY DAY  30 capsule  5  . spironolactone (ALDACTONE) 25 MG tablet Take 1 tablet (25 mg  total) by mouth daily.  30 tablet  6  . TRILIPIX 135 MG capsule TAKE ONE CAPSULE BY MOUTH EVERY DAY  30 each  5    No Known Allergies  Past Medical History  Diagnosis Date  . MI (myocardial infarction) 10/2005    ANTERIOR  . MI, acute, non ST segment elevation 04/2008    WITH INTERVENTION TO THE RIGHT CORONARY  . MI (myocardial infarction) 03/2009    ANTERIOR  . Obesity   . Hyperlipidemia   . Coronary artery disease   . LV dysfunction   . ICD (implantable cardiac defibrillator) in place   . Tobacco abuse   . Systolic heart failure     Past Surgical History  Procedure Date  . Cardiac catheterization 03/31/2009  . Cardiac catheterization 05/08/2008    CARDIAC SIZE AND SILHOUETTE NORMAL. THERE WAS ANTERIOR APICAL HYPOKINESIS WITH EF 35-40%  . Coronary angioplasty     BALLOON ANGIOPLASTY TO THE LAD WITH THROMBUS EXTRACTION OF THE PREVIOUSLY STENTED SEGMENT AND NEW STENT PLACEMENT TO THE LEFT CIRCUMFLEX. EF IS DOWN IN THE 30% RANGE  . Cardiac defibrillator placement 11/2009    History  Smoking status  . Current Everyday Smoker -- 1.5 packs/day  for 24 years  . Types: Cigarettes  Smokeless tobacco  . Never Used    History  Alcohol Use No    Family History  Problem Relation Age of Onset  . Cancer Mother   . Heart disease Father     Review of Systems: The review of systems is per the HPI.  All other systems were reviewed and are negative.  Physical Exam: BP 118/82  Pulse 57  Ht 6\' 1"  (1.854 m)  Wt 279 lb (126.554 kg)  BMI 36.81 kg/m2 Patient is peasant and in no acute distress. He remains obese. Skin is warm and dry. Color is normal.  HEENT is unremarkable. Normocephalic/atraumatic. PERRL. Sclera are nonicteric. Neck is supple. No masses. No JVD. Lungs are clear. Cardiac exam shows a regular rate and rhythm. Abdomen is obese but softer. Extremities are without edema todau. Gait and ROM are intact. No gross neurologic deficits noted.   LABORATORY DATA:  Repeat BMET  today is pending     Chemistry     Component Value Date/Time   NA 139 04/27/2011 1026   K 3.7 04/27/2011 1026   CL 104 04/27/2011 1026   CO2 28 04/27/2011 1026   BUN 16 04/27/2011 1026   CREATININE 1.0 04/27/2011 1026     Echo Study Conclusions  - Left ventricle: The cavity size was normal. Wall thickness was increased in a pattern of mild LVH. Systolic function was moderately reduced. The estimated ejection fraction was 40%, in the range of 35% to 40%. Diffuse hypokinesis. There is akinesis of the mid-distalanteroseptal and apical myocardium. Doppler parameters are consistent with abnormal left ventricular relaxation (grade 1 diastolic dysfunction). - Aortic valve: Trivial regurgitation. - Mitral valve: Mild regurgitation. - Left atrium: The atrium was mildly to moderately dilated.      Assessment / Plan:

## 2011-05-06 NOTE — Patient Instructions (Signed)
We will check your lab today  Stay on your current medicines. You may use the Lasix just as needed. Take for weight gain of 2 to 3 pounds overnight  I will see you in a month  Salt should be less than 2000 mg per day  Call the [facility] office at [phone] if you have any questions, problems or concerns.

## 2011-05-06 NOTE — Assessment & Plan Note (Signed)
He is better clinically. We will keep him on this current regimen. I do not think we have room to increase the beta blocker, given his bradycardia and soft blood pressure. He is now pretty much asymptomatic. We will recheck a BMET today. I will see him in a month. We did discuss possible CHF referral, but he is wanting to hold off for now. Smoking cessation was encouraged again as well good health measures in general. Continue to restrict salt. Patient is agreeable to this plan and will call if any problems develop in the interim.

## 2011-06-01 ENCOUNTER — Ambulatory Visit (INDEPENDENT_AMBULATORY_CARE_PROVIDER_SITE_OTHER): Payer: Medicaid Other | Admitting: Internal Medicine

## 2011-06-01 ENCOUNTER — Encounter: Payer: Self-pay | Admitting: Internal Medicine

## 2011-06-01 ENCOUNTER — Ambulatory Visit: Payer: Medicaid Other | Admitting: Nurse Practitioner

## 2011-06-01 VITALS — BP 130/82 | HR 62 | Ht 73.0 in | Wt 283.0 lb

## 2011-06-01 DIAGNOSIS — I509 Heart failure, unspecified: Secondary | ICD-10-CM

## 2011-06-01 DIAGNOSIS — I5022 Chronic systolic (congestive) heart failure: Secondary | ICD-10-CM

## 2011-06-01 DIAGNOSIS — F17200 Nicotine dependence, unspecified, uncomplicated: Secondary | ICD-10-CM

## 2011-06-01 LAB — ICD DEVICE OBSERVATION
BATTERY VOLTAGE: 3.1815 V
BRDY-0002RV: 40 {beats}/min
PACEART VT: 0
TOT-0006: 20111102000000
TZAT-0002SLOWVT: NEGATIVE
TZAT-0012FASTVT: 200 ms
TZAT-0018FASTVT: NEGATIVE
TZAT-0018SLOWVT: NEGATIVE
TZAT-0019FASTVT: 8 V
TZAT-0019SLOWVT: 8 V
TZAT-0020FASTVT: 1.5 ms
TZST-0001FASTVT: 5
TZST-0001SLOWVT: 3
TZST-0001SLOWVT: 4
TZST-0001SLOWVT: 5
TZST-0001SLOWVT: 6
TZST-0002FASTVT: NEGATIVE
TZST-0002FASTVT: NEGATIVE
TZST-0002SLOWVT: NEGATIVE
TZST-0002SLOWVT: NEGATIVE
VENTRICULAR PACING ICD: 0 pct

## 2011-06-01 NOTE — Assessment & Plan Note (Signed)
Stable will continue on his current meds

## 2011-06-01 NOTE — Assessment & Plan Note (Signed)
Reviewed his situation in various options. Lozenges as the cause and to smoke patches have been no help Chantix was a failure

## 2011-06-01 NOTE — Assessment & Plan Note (Signed)
The patient's device was interrogated.  The information was reviewed. No changes were made in the programming.    

## 2011-06-01 NOTE — Progress Notes (Signed)
HPI  Jonathan Fischer is a 41 y.o. male Seen infollowup for an ICD implanted for complex coronary disease. This was done in November 2011.  He was seen about a month ago by [facility] and found to have significant heart failure. Diuretics were initiated. Echo showed no intercurrent worsening of his LVEF at 35-40%  He is currently taking Aldactone and Lasix when necessary.  He continues to smoke. He is living with his father and his father smokes  The patient has a complex cardiac history dating back to 2007 when he suffered an anterior wall MI and underwent LAD stenting. The next year he had occlusion of the LAD with repeat catheterization and restenting. In 2010 he suffered a non-STEMI and underwent stenting of his RCA. In March of this year he had a repeat infarction and his anterior wall distribution and underwent emergency angioplasty with thrombus extraction of his previously stented segment. He then underwent intervention of his circumflex artery. Recent reassessment of left ventricular function by echo in September demonstrated persistent depression in the 30-35% ra  Past Medical History  Diagnosis Date  . MI (myocardial infarction) 10/2005    ANTERIOR  . MI, acute, non ST segment elevation 04/2008    WITH INTERVENTION TO THE RIGHT CORONARY  . MI (myocardial infarction) 03/2009    ANTERIOR  . Obesity   . Hyperlipidemia   . Coronary artery disease   . LV dysfunction   . ICD (implantable cardiac defibrillator) in place   . Tobacco abuse   . Systolic heart failure     Past Surgical History  Procedure Date  . Cardiac catheterization 03/31/2009  . Cardiac catheterization 05/08/2008    CARDIAC SIZE AND SILHOUETTE NORMAL. THERE WAS ANTERIOR APICAL HYPOKINESIS WITH EF 35-40%  . Coronary angioplasty     BALLOON ANGIOPLASTY TO THE LAD WITH THROMBUS EXTRACTION OF THE PREVIOUSLY STENTED SEGMENT AND NEW STENT PLACEMENT TO THE LEFT CIRCUMFLEX. EF IS DOWN IN THE 30% RANGE  . Cardiac defibrillator  placement 11/2009    Current Outpatient Prescriptions  Medication Sig Dispense Refill  . aspirin 325 MG tablet Take 325 mg by mouth daily.        [name] buPROPion (WELLBUTRIN SR) 150 MG 12 hr tablet Take 150 mg by mouth daily.        [name] EFFIENT 10 MG TABS TAKE ONE TABLET BY MOUTH EVERY DAY  30 each  5  . furosemide (LASIX) 20 MG tablet Take 20 mg by mouth as needed.      [name] LIPITOR 80 MG tablet TAKE ONE TABLET BY MOUTH EVERY DAY  30 each  5  . metoprolol succinate (TOPROL-XL) 25 MG 24 hr tablet TAKE ONE TABLET BY MOUTH EVERY DAY  30 tablet  5  . nitroGLYCERIN (NITROSTAT) 0.4 MG SL tablet DISSOLVE ONE TABLET UNDER THE TONGUE AS NEEDED  25 tablet  11  . omeprazole (PRILOSEC) 20 MG capsule TAKE ONE CAPSULE BY MOUTH EVERY DAY  30 capsule  5  . ramipril (ALTACE) 10 MG capsule TAKE ONE CAPSULE BY MOUTH EVERY DAY  30 capsule  5  . spironolactone (ALDACTONE) 25 MG tablet Take 1 tablet (25 mg total) by mouth daily.  30 tablet  6  . TRILIPIX 135 MG capsule TAKE ONE CAPSULE BY MOUTH EVERY DAY  30 each  5  . DISCONTD: furosemide (LASIX) 20 MG tablet Take 1 tablet (20 mg total) by mouth daily.  30 tablet  11    No Known Allergies  Review of Systems  negative except from HPI and PMH  Physical Exam BP 130/82  Pulse 62  Ht 6\' 1"  (1.854 m)  Wt 283 lb (128.368 kg)  BMI 37.34 kg/m2 Well developed and well nourished in no acute distress HENT normal E scleral and icterus clear Neck Supple JVP flat; carotids brisk and full Clear to ausculation Regular rate and rhythm, no murmurs gallops or rub Soft with active bowel sounds No clubbing cyanosis none Edema Alert and oriented, grossly normal motor and sensory function Skin Warm and Dry    Assessment and  Plan

## 2011-06-01 NOTE — Patient Instructions (Signed)
Your physician recommends that you schedule a follow-up appointment in: 4 weeks with Norma Fredrickson, NP  Remote monitoring is used to monitor your Pacemaker of ICD from home. This monitoring reduces the number of office visits required to check your device to one time per year. It allows [place] to keep an eye on the functioning of your device to ensure it is working properly. You are scheduled for a device check from home on 09/02/11. You may send your transmission at any time that day. If you have a wireless device, the transmission will be sent automatically. After your physician reviews your transmission, you will receive a postcard with your next transmission date.  Your physician wants you to follow-up in: 1 year with Dr. Graciela Husbands. You will receive a reminder letter in the mail two months in advance. If you don't receive a letter, please call our office to schedule the follow-up appointment.  Your physician recommends that you continue on your current medications as directed. Please refer to the Current Medication list given to you today.

## 2011-06-29 ENCOUNTER — Ambulatory Visit: Payer: Medicaid Other | Admitting: Nurse Practitioner

## 2011-08-23 ENCOUNTER — Telehealth: Payer: Self-pay | Admitting: Cardiology

## 2011-08-23 NOTE — Telephone Encounter (Signed)
Patient called no answer.LMTC. 

## 2011-08-23 NOTE — Telephone Encounter (Signed)
New msg Pt will not have insurance until September. He wants to know if could get samples of effient.

## 2011-08-23 NOTE — Telephone Encounter (Signed)
F/u   Pt wife Danelle Earthly called for update on this matter. Please return her call at [phone]

## 2011-08-24 NOTE — Telephone Encounter (Signed)
Spoke to patient's wife was told effient 10 mg and trilipix 135 mg samples left at 3rd floor front desk.

## 2011-08-24 NOTE — Telephone Encounter (Signed)
Pt spouse rtn your call/lg

## 2011-09-02 ENCOUNTER — Ambulatory Visit (INDEPENDENT_AMBULATORY_CARE_PROVIDER_SITE_OTHER): Payer: Medicaid Other | Admitting: *Deleted

## 2011-09-02 DIAGNOSIS — I509 Heart failure, unspecified: Secondary | ICD-10-CM

## 2011-09-02 DIAGNOSIS — Z95810 Presence of automatic (implantable) cardiac defibrillator: Secondary | ICD-10-CM

## 2011-09-02 DIAGNOSIS — I5022 Chronic systolic (congestive) heart failure: Secondary | ICD-10-CM

## 2011-09-06 ENCOUNTER — Encounter: Payer: Self-pay | Admitting: Internal Medicine

## 2011-09-07 LAB — REMOTE ICD DEVICE
CHARGE TIME: 9.028 s
DEV-0020ICD: NEGATIVE
PACEART VT: 0
RV LEAD AMPLITUDE: 14.8 mv
TOT-0001: 1
TZAT-0001FASTVT: 1
TZAT-0012FASTVT: 200 ms
TZAT-0012SLOWVT: 200 ms
TZAT-0018FASTVT: NEGATIVE
TZAT-0018SLOWVT: NEGATIVE
TZAT-0019FASTVT: 8 V
TZAT-0020FASTVT: 1.5 ms
TZAT-0020SLOWVT: 1.5 ms
TZON-0003SLOWVT: 360 ms
TZON-0003VSLOWVT: 360 ms
TZON-0004SLOWVT: 16
TZST-0001FASTVT: 2
TZST-0001FASTVT: 6
TZST-0001SLOWVT: 2
TZST-0001SLOWVT: 4
TZST-0001SLOWVT: 5
TZST-0001SLOWVT: 6
TZST-0002FASTVT: NEGATIVE
TZST-0002FASTVT: NEGATIVE
TZST-0002FASTVT: NEGATIVE
TZST-0002SLOWVT: NEGATIVE
TZST-0002SLOWVT: NEGATIVE
VENTRICULAR PACING ICD: 0 pct
VF: 0

## 2011-09-23 ENCOUNTER — Encounter: Payer: Self-pay | Admitting: *Deleted

## 2011-10-19 ENCOUNTER — Emergency Department: Payer: Self-pay | Admitting: Emergency Medicine

## 2011-10-19 LAB — COMPREHENSIVE METABOLIC PANEL
Albumin: 3.7 g/dL (ref 3.4–5.0)
Alkaline Phosphatase: 73 U/L (ref 50–136)
Anion Gap: 9 (ref 7–16)
BUN: 12 mg/dL (ref 7–18)
Calcium, Total: 8.5 mg/dL (ref 8.5–10.1)
EGFR (Non-African Amer.): >60
Glucose: 131 mg/dL — ABNORMAL HIGH (ref 65–99)
Osmolality: 283 (ref 275–301)
Potassium: 3.4 mmol/L — ABNORMAL LOW (ref 3.5–5.1)
SGOT(AST): 35 U/L (ref 15–37)
SGPT (ALT): 53 U/L (ref 12–78)

## 2011-10-19 LAB — CBC
HCT: 44.9 % (ref 35.0–47.0)
HGB: 16 g/dL (ref 12.0–16.0)
MCH: 33.1 pg (ref 26.0–34.0)
MCHC: 35.5 g/dL (ref 32.0–36.0)
RBC: 4.82 10*6/uL (ref 3.80–5.20)
RDW: 13.2 % (ref 11.5–14.5)

## 2011-10-19 LAB — TROPONIN I: Troponin-I: <0.02 ng/mL

## 2011-12-12 ENCOUNTER — Inpatient Hospital Stay (HOSPITAL_COMMUNITY): Payer: Medicare Other

## 2011-12-12 ENCOUNTER — Inpatient Hospital Stay (HOSPITAL_COMMUNITY)
Admission: EM | Admit: 2011-12-12 | Discharge: 2011-12-14 | DRG: 247 | Disposition: A | Discharge status: Home / Self Care | Payer: Medicare Other | Attending: Cardiology | Admitting: Cardiology

## 2011-12-12 ENCOUNTER — Encounter (HOSPITAL_COMMUNITY): Payer: Self-pay | Admitting: Cardiology

## 2011-12-12 ENCOUNTER — Encounter (HOSPITAL_COMMUNITY)
Admission: EM | Disposition: A | Discharge status: Home / Self Care | Payer: Self-pay | Source: Home / Self Care | Attending: Cardiology

## 2011-12-12 DIAGNOSIS — Z79899 Other long term (current) drug therapy: Secondary | ICD-10-CM

## 2011-12-12 DIAGNOSIS — Z9861 Coronary angioplasty status: Secondary | ICD-10-CM

## 2011-12-12 DIAGNOSIS — I219 Acute myocardial infarction, unspecified: Secondary | ICD-10-CM

## 2011-12-12 DIAGNOSIS — I472 Ventricular tachycardia, unspecified: Secondary | ICD-10-CM | POA: Diagnosis not present

## 2011-12-12 DIAGNOSIS — E669 Obesity, unspecified: Secondary | ICD-10-CM | POA: Diagnosis present

## 2011-12-12 DIAGNOSIS — E785 Hyperlipidemia, unspecified: Secondary | ICD-10-CM | POA: Diagnosis present

## 2011-12-12 DIAGNOSIS — I2109 ST elevation (STEMI) myocardial infarction involving other coronary artery of anterior wall: Secondary | ICD-10-CM

## 2011-12-12 DIAGNOSIS — I255 Ischemic cardiomyopathy: Secondary | ICD-10-CM

## 2011-12-12 DIAGNOSIS — I252 Old myocardial infarction: Secondary | ICD-10-CM

## 2011-12-12 DIAGNOSIS — I2589 Other forms of chronic ischemic heart disease: Secondary | ICD-10-CM | POA: Diagnosis present

## 2011-12-12 DIAGNOSIS — I213 ST elevation (STEMI) myocardial infarction of unspecified site: Secondary | ICD-10-CM

## 2011-12-12 DIAGNOSIS — Z91199 Patient's noncompliance with other medical treatment and regimen due to unspecified reason: Secondary | ICD-10-CM

## 2011-12-12 DIAGNOSIS — I25110 Atherosclerotic heart disease of native coronary artery with unstable angina pectoris: Secondary | ICD-10-CM | POA: Diagnosis present

## 2011-12-12 DIAGNOSIS — Z95810 Presence of automatic (implantable) cardiac defibrillator: Secondary | ICD-10-CM | POA: Diagnosis present

## 2011-12-12 DIAGNOSIS — K0889 Other specified disorders of teeth and supporting structures: Secondary | ICD-10-CM

## 2011-12-12 DIAGNOSIS — F17200 Nicotine dependence, unspecified, uncomplicated: Secondary | ICD-10-CM | POA: Diagnosis present

## 2011-12-12 DIAGNOSIS — I5022 Chronic systolic (congestive) heart failure: Secondary | ICD-10-CM | POA: Diagnosis present

## 2011-12-12 DIAGNOSIS — I2510 Atherosclerotic heart disease of native coronary artery without angina pectoris: Secondary | ICD-10-CM | POA: Diagnosis present

## 2011-12-12 DIAGNOSIS — K219 Gastro-esophageal reflux disease without esophagitis: Secondary | ICD-10-CM | POA: Diagnosis present

## 2011-12-12 DIAGNOSIS — I4729 Other ventricular tachycardia: Secondary | ICD-10-CM

## 2011-12-12 DIAGNOSIS — Z9119 Patient's noncompliance with other medical treatment and regimen: Secondary | ICD-10-CM

## 2011-12-12 DIAGNOSIS — Z7982 Long term (current) use of aspirin: Secondary | ICD-10-CM

## 2011-12-12 DIAGNOSIS — K089 Disorder of teeth and supporting structures, unspecified: Secondary | ICD-10-CM | POA: Diagnosis not present

## 2011-12-12 HISTORY — DX: Ischemic cardiomyopathy: I25.5

## 2011-12-12 HISTORY — PX: LEFT HEART CATH: SHX5478

## 2011-12-12 HISTORY — PX: CORONARY ANGIOPLASTY WITH STENT PLACEMENT: SHX49

## 2011-12-12 HISTORY — DX: ST elevation (STEMI) myocardial infarction of unspecified site: I21.3

## 2011-12-12 HISTORY — DX: Chronic systolic (congestive) heart failure: I50.22

## 2011-12-12 LAB — TROPONIN I
Troponin I: 11.09 ng/mL (ref <0.30)
Troponin I: >20 ng/mL (ref <0.30)
Troponin I: >20 ng/mL (ref <0.30)

## 2011-12-12 LAB — CBC
HCT: 47.8 % (ref 39.0–52.0)
MCH: 33.1 pg (ref 26.0–34.0)
MCH: 32.9 pg (ref 26.0–34.0)
MCHC: 36.3 g/dL — ABNORMAL HIGH (ref 30.0–36.0)
MCV: 91 fL (ref 78.0–100.0)
MCV: 91.9 fL (ref 78.0–100.0)
Platelets: 274 10*3/uL (ref 150–400)
Platelets: 218 10*3/uL (ref 150–400)
RBC: 5.2 MIL/uL (ref 4.22–5.81)
RDW: 12.8 % (ref 11.5–15.5)
WBC: 15 10*3/uL — ABNORMAL HIGH (ref 4.0–10.5)

## 2011-12-12 LAB — TSH: TSH: 0.633 u[IU]/mL (ref 0.350–4.500)

## 2011-12-12 LAB — COMPREHENSIVE METABOLIC PANEL
ALT: 35 U/L (ref 0–53)
Alkaline Phosphatase: 61 U/L (ref 39–117)
BUN: 9 mg/dL (ref 6–23)
Chloride: 100 mEq/L (ref 96–112)
GFR calc Af Amer: >90 mL/min (ref >90)
GFR calc non Af Amer: >90 mL/min (ref >90)
Total Bilirubin: 0.3 mg/dL (ref 0.3–1.2)

## 2011-12-12 LAB — POCT I-STAT, CHEM 8
Calcium, Ion: 1.16 mmol/L (ref 1.12–1.23)
Creatinine, Ser: 0.8 mg/dL (ref 0.50–1.35)
Glucose, Bld: 181 mg/dL — ABNORMAL HIGH (ref 70–99)
Hemoglobin: 16.7 g/dL (ref 13.0–17.0)
TCO2: 25 mmol/L (ref 0–100)

## 2011-12-12 SURGERY — LEFT HEART CATHETERIZATION WITH CORONARY ANGIOGRAM
Anesthesia: LOCAL | Laterality: Bilateral

## 2011-12-12 SURGERY — LEFT HEART CATH
Anesthesia: LOCAL | Laterality: Bilateral

## 2011-12-12 MED ORDER — NITROGLYCERIN 0.4 MG SL SUBL: 0.4000 mg | SUBLINGUAL_TABLET | SUBLINGUAL | Status: DC | PRN

## 2011-12-12 MED ORDER — PRASUGREL HCL 10 MG PO TABS
ORAL_TABLET | ORAL | Status: AC
  Filled 2011-12-12: qty 6

## 2011-12-12 MED ORDER — NITROGLYCERIN 0.2 MG/ML ON CALL CATH LAB
INTRAVENOUS | Status: AC
  Filled 2011-12-12: qty 1

## 2011-12-12 MED ORDER — METOPROLOL SUCCINATE ER 25 MG PO TB24
25.0000 mg | ORAL_TABLET | Freq: Every day | ORAL | Status: DC
  Administered 2011-12-12 – 2011-12-14 (×3): 25 mg via ORAL
  Filled 2011-12-12 (×3): qty 1

## 2011-12-12 MED ORDER — SODIUM CHLORIDE 0.9 % IV SOLN
INTRAVENOUS | Status: AC
  Administered 2011-12-12: 75 mL/h via INTRAVENOUS

## 2011-12-12 MED ORDER — HEPARIN SODIUM (PORCINE) 5000 UNIT/ML IJ SOLN
4000.0000 [IU] | INTRAMUSCULAR | Status: AC
  Administered 2011-12-12: 4000 [IU] via INTRAVENOUS
  Filled 2011-12-12: qty 1

## 2011-12-12 MED ORDER — HEPARIN (PORCINE) IN NACL 2-0.9 UNIT/ML-% IJ SOLN
INTRAMUSCULAR | Status: AC
  Filled 2011-12-12: qty 1000

## 2011-12-12 MED ORDER — MORPHINE SULFATE 4 MG/ML IJ SOLN
INTRAMUSCULAR | Status: AC
  Filled 2011-12-12: qty 1

## 2011-12-12 MED ORDER — ZOLPIDEM TARTRATE 5 MG PO TABS: 5.0000 mg | ORAL_TABLET | Freq: Every evening | ORAL | Status: DC | PRN

## 2011-12-12 MED ORDER — ENOXAPARIN SODIUM 40 MG/0.4ML ~~LOC~~ SOLN
40.0000 mg | SUBCUTANEOUS | Status: DC
  Administered 2011-12-13 – 2011-12-14 (×2): 40 mg via SUBCUTANEOUS
  Filled 2011-12-12 (×3): qty 0.4

## 2011-12-12 MED ORDER — PRASUGREL HCL 10 MG PO TABS
10.0000 mg | ORAL_TABLET | Freq: Every day | ORAL | Status: DC
  Administered 2011-12-13 – 2011-12-14 (×2): 10 mg via ORAL
  Filled 2011-12-12 (×2): qty 1

## 2011-12-12 MED ORDER — SODIUM CHLORIDE 0.9 % IJ SOLN: 3.0000 mL | INTRAMUSCULAR | Status: DC | PRN

## 2011-12-12 MED ORDER — ASPIRIN EC 81 MG PO TBEC
81.0000 mg | DELAYED_RELEASE_TABLET | Freq: Every day | ORAL | Status: DC
  Administered 2011-12-12 – 2011-12-14 (×3): 81 mg via ORAL
  Filled 2011-12-12 (×3): qty 1

## 2011-12-12 MED ORDER — SODIUM CHLORIDE 0.9 % IV SOLN
1.7500 mg/kg/h | INTRAVENOUS | Status: DC
  Administered 2011-12-12: 1.75 mg/kg/h via INTRAVENOUS

## 2011-12-12 MED ORDER — ACETAMINOPHEN 325 MG PO TABS
650.0000 mg | ORAL_TABLET | ORAL | Status: DC | PRN
  Administered 2011-12-12 – 2011-12-13 (×4): 650 mg via ORAL
  Filled 2011-12-12 (×4): qty 2

## 2011-12-12 MED ORDER — BIVALIRUDIN 250 MG IV SOLR
INTRAVENOUS | Status: AC
  Filled 2011-12-12: qty 250

## 2011-12-12 MED ORDER — SODIUM CHLORIDE 0.9 % IV SOLN
1.7500 mg/kg/h | INTRAVENOUS | Status: DC
  Filled 2011-12-12: qty 250

## 2011-12-12 MED ORDER — ONDANSETRON HCL 4 MG/2ML IJ SOLN: 4.0000 mg | Freq: Four times a day (QID) | INTRAMUSCULAR | Status: DC | PRN

## 2011-12-12 MED ORDER — VERAPAMIL HCL 2.5 MG/ML IV SOLN
INTRAVENOUS | Status: AC
  Filled 2011-12-12: qty 2

## 2011-12-12 MED ORDER — SODIUM CHLORIDE 0.9 % IJ SOLN
3.0000 mL | Freq: Two times a day (BID) | INTRAMUSCULAR | Status: DC
  Administered 2011-12-12 – 2011-12-13 (×4): 3 mL via INTRAVENOUS

## 2011-12-12 MED ORDER — ALPRAZOLAM 0.25 MG PO TABS
0.2500 mg | ORAL_TABLET | Freq: Two times a day (BID) | ORAL | Status: DC | PRN
  Administered 2011-12-13 (×2): 0.25 mg via ORAL
  Filled 2011-12-12 (×2): qty 1

## 2011-12-12 MED ORDER — ATORVASTATIN CALCIUM 80 MG PO TABS
80.0000 mg | ORAL_TABLET | Freq: Every day | ORAL | Status: DC
  Administered 2011-12-12 – 2011-12-13 (×2): 80 mg via ORAL
  Filled 2011-12-12 (×3): qty 1

## 2011-12-12 MED ORDER — SPIRONOLACTONE 25 MG PO TABS
25.0000 mg | ORAL_TABLET | Freq: Every day | ORAL | Status: DC
  Administered 2011-12-12 – 2011-12-14 (×3): 25 mg via ORAL
  Filled 2011-12-12 (×3): qty 1

## 2011-12-12 MED ORDER — SODIUM CHLORIDE 0.9 % IV SOLN: 250.0000 mL | INTRAVENOUS | Status: DC | PRN

## 2011-12-12 MED ORDER — MORPHINE SULFATE 4 MG/ML IJ SOLN
4.0000 mg | Freq: Once | INTRAMUSCULAR | Status: AC
  Administered 2011-12-12: 4 mg via INTRAVENOUS

## 2011-12-12 MED ORDER — FENOFIBRATE 160 MG PO TABS
160.0000 mg | ORAL_TABLET | ORAL | Status: DC
  Administered 2011-12-12 – 2011-12-14 (×2): 160 mg via ORAL
  Filled 2011-12-12 (×2): qty 1

## 2011-12-12 MED ORDER — PANTOPRAZOLE SODIUM 40 MG PO TBEC
40.0000 mg | DELAYED_RELEASE_TABLET | Freq: Every day | ORAL | Status: DC
  Administered 2011-12-12 – 2011-12-14 (×3): 40 mg via ORAL
  Filled 2011-12-12 (×3): qty 1

## 2011-12-12 MED ORDER — LIDOCAINE HCL (PF) 1 % IJ SOLN
INTRAMUSCULAR | Status: AC
  Filled 2011-12-12: qty 30

## 2011-12-12 MED ORDER — SODIUM CHLORIDE 0.9 % IV SOLN: INTRAVENOUS | Status: DC

## 2011-12-12 MED ORDER — RAMIPRIL 10 MG PO CAPS
10.0000 mg | ORAL_CAPSULE | Freq: Every day | ORAL | Status: DC
  Administered 2011-12-12 – 2011-12-14 (×3): 10 mg via ORAL
  Filled 2011-12-12 (×3): qty 1

## 2011-12-12 MED ORDER — BUPROPION HCL ER (SR) 150 MG PO TB12
150.0000 mg | ORAL_TABLET | Freq: Every day | ORAL | Status: DC
  Administered 2011-12-12 – 2011-12-14 (×3): 150 mg via ORAL
  Filled 2011-12-12 (×3): qty 1

## 2011-12-12 NOTE — Progress Notes (Signed)
Pt had 26 beat run of VT. Pt stated he was ambulating in room and felt "heart speed up like when they check ICD". VSS. Pt noted back in NSR. No shocks from ICD. K. Lyman Bishop, NP notified. Will monitor at this time.

## 2011-12-12 NOTE — ED Notes (Signed)
Pt with hx of multiple MI's.  4 stents placed.  Pt diaphoretic upon arrival, ST elevation noted.

## 2011-12-12 NOTE — CV Procedure (Signed)
Cardiac Catheterization Procedure Note  Name: Jonathan Fischer MRN: [id number] DOB: 1970/02/24  Procedure: Left Heart Cath, Selective Coronary Angiography, LV angiography, PTCA and stenting of the proximal LAD  Indication:41 year old white male with extensive cardiac history. He is status post remote anterior myocardial infarction with stenting of the proximal LAD with a Cypher stent. He had recurrent anterior myocardial infarction in 2010 treated with thrombectomy and balloon angioplasty. He is also status post stenting of the left circumflex and right coronaries with drug-eluting stents. He presents with an acute anterior ST elevation myocardial infarction. He has 2 mm of ST elevation in leads 1, aVL, and V3 through V6.  Procedural Details:  The right wrist was prepped, draped, and anesthetized with 1% lidocaine. Using the modified Seldinger technique, a 6 French sheath was introduced into the right radial artery. 3 mg of verapamil was administered through the sheath, weight-based bivalirudin was administered intravenously. Standard Judkins catheters were used for selective coronary angiography and left ventriculography. Catheter exchanges were performed over an exchange length guidewire.  PROCEDURAL FINDINGS Hemodynamics: AO 158/96 with a mean of 123 mmHg LV 156/28 mmHg    Coronary angiography: Coronary dominance: right  Left mainstem: normal.  Left anterior descending (LAD): the LAD is occluded in the proximal vessel at the site of the prior stent. There is TIMI grade 0 flow.  Left circumflex (LCx): the left circumflex coronary has 30% stenosis in the mid vessel prior to the stent. The stent is widely patent. There are 2 small marginal branches proximally and then the circumflex terminates in 2 moderate-sized marginal branches. These are all without significant disease.  Right coronary artery (RCA): the right coronary is a dominant vessel. A stent in the mid vessel is widely patent.  There are minor irregularities in the RCA.  Left ventriculography: Left ventricular systolic function is abnormal. The mid to distal anterolateral wall is akinetic. The apex is dilated and dyskinetic. The distal inferior wall is also akinetic. Overall ejection fraction is estimated at 25-30%.  PCI Note:  Following the diagnostic procedure, the decision was made to proceed with PCI of the LAD. Effient 60 mg was given orally. Once a therapeutic ACT was achieved, a 6 [place] XB LAD 3.5 guide catheter was inserted.  A pro-water coronary guidewire was used to cross the lesion.  The lesion was predilated with a 2.5 mm compliant balloon. With reperfusion it was noted that there was extensive in-stent disease with some plaque extending beyond the stent margins. The lesion was then stented with a 3.0 x 32 mm Promus stent.  The stent was postdilated with a 3.5 mm noncompliant balloon.  Following PCI, there was 0% residual stenosis and TIMI-3 flow. Final angiography confirmed an excellent result. The patient tolerated the procedure well. There were no immediate procedural complications. At the end of the procedure his ST elevation had resolved and he was pain-free.A TR band was used for radial hemostasis. The patient was transferred to the post catheterization recovery area for further monitoring.  PCI Data: Vessel - LAD/Segment - proximal Percent Stenosis (pre)  100% TIMI-flow 0 Stent 3.0 x 32 mm Promus stent Percent Stenosis (post) 0% TIMI-flow (post) 3  Final Conclusions:   1. Single vessel occlusive coronary disease involving the proximal LAD. 2. Continued excellent patency of the stents in the mid left circumflex and mid RCA. 3. Severe left ventricular dysfunction. 4. Successful reperfusion of the LAD and repeat stenting of the proximal LAD with a drug-eluting stent.   Recommendations:  Continue dual  antiplatelet therapy indefinitely.  Gabriela Irigoyen [facility] 12/12/2011, 9:00 AM

## 2011-12-12 NOTE — ED Provider Notes (Addendum)
History     CSN: [id number]  Arrival date & time 12/12/11  4098   First MD Initiated Contact with Patient 12/12/11 [id number]      Chief Complaint  Patient presents with  . Code STEMI    (Consider location/radiation/quality/duration/timing/severity/associated sxs/prior treatment) HPI Comments: Patient presents with onset of chest tightness that started about an hour prior to arrival to the emergency department. He's had a history of MIs in the past with 4 stents placed the last one was in 2003. Per EMS he was diaphoretic and nauseated on arrival. He also complains of shortness of breath. ST elevation was noted by EMS and anterior leads reversible changes inferiorly. A code STEMI was called in patient was brought to the ED. Patient was given 4 baby aspirin and 3 nitroglycerin prior to arrival by EMS. He states the nitroglycerin has not improved his pain.   Past Medical History  Diagnosis Date  . MI (myocardial infarction) 10/2005    ANTERIOR  . MI, acute, non ST segment elevation 04/2008    WITH INTERVENTION TO THE RIGHT CORONARY  . MI (myocardial infarction) 03/2009    ANTERIOR  . Obesity   . Hyperlipidemia   . Coronary artery disease   . LV dysfunction   . ICD (implantable cardiac defibrillator) in place   . Tobacco abuse   . Systolic heart failure     Past Surgical History  Procedure Date  . Cardiac catheterization 03/31/2009  . Cardiac catheterization 05/08/2008    CARDIAC SIZE AND SILHOUETTE NORMAL. THERE WAS ANTERIOR APICAL HYPOKINESIS WITH EF 35-40%  . Coronary angioplasty     BALLOON ANGIOPLASTY TO THE LAD WITH THROMBUS EXTRACTION OF THE PREVIOUSLY STENTED SEGMENT AND NEW STENT PLACEMENT TO THE LEFT CIRCUMFLEX. EF IS DOWN IN THE 30% RANGE  . Cardiac defibrillator placement 11/2009    Family History  Problem Relation Age of Onset  . Cancer Mother   . Heart disease Father     History  Substance Use Topics  . Smoking status: Current Every Day Smoker -- 1.5 packs/day for  24 years    Types: Cigarettes  . Smokeless tobacco: Never Used  . Alcohol Use: No      Review of Systems  Constitutional: Positive for diaphoresis and fatigue. Negative for fever and chills.  HENT: Negative for congestion, rhinorrhea and sneezing.   Eyes: Negative.   Respiratory: Positive for chest tightness and shortness of breath. Negative for cough.   Cardiovascular: Negative for chest pain and leg swelling.  Gastrointestinal: Positive for nausea. Negative for vomiting, abdominal pain, diarrhea and blood in stool.  Genitourinary: Negative for frequency, hematuria, flank pain and difficulty urinating.  Musculoskeletal: Negative for back pain and arthralgias.  Skin: Negative for rash.  Neurological: Negative for dizziness, speech difficulty, weakness, numbness and headaches.    Allergies  Review of patient's allergies indicates no known allergies.  Home Medications   Current Outpatient Rx  Name  Route  Sig  Dispense  Refill  . ASPIRIN 325 MG PO TABS   Oral   Take 325 mg by mouth daily.           . BUPROPION HCL ER (SR) 150 MG PO TB12   Oral   Take 150 mg by mouth daily.           [name] EFFIENT 10 MG PO TABS      TAKE ONE TABLET BY MOUTH EVERY DAY   30 each   5   . FUROSEMIDE 20 MG PO  TABS   Oral   Take 20 mg by mouth as needed.         [name] LIPITOR 80 MG PO TABS      TAKE ONE TABLET BY MOUTH EVERY DAY   30 each   5   . METOPROLOL SUCCINATE ER 25 MG PO TB24      TAKE ONE TABLET BY MOUTH EVERY DAY   30 tablet   5   . NITROGLYCERIN 0.4 MG SL SUBL      DISSOLVE ONE TABLET UNDER THE TONGUE AS NEEDED   25 tablet   11   . OMEPRAZOLE 20 MG PO CPDR      TAKE ONE CAPSULE BY MOUTH EVERY DAY   30 capsule   5   . RAMIPRIL 10 MG PO CAPS      TAKE ONE CAPSULE BY MOUTH EVERY DAY   30 capsule   5   . SPIRONOLACTONE 25 MG PO TABS   Oral   Take 1 tablet (25 mg total) by mouth daily.   30 tablet   6   . TRILIPIX 135 MG PO CPDR      TAKE ONE CAPSULE BY  MOUTH EVERY DAY   30 each   5     There were no vitals taken for this visit.  Physical Exam  Constitutional: He is oriented to person, place, and time. He appears well-developed and well-nourished.       Pale appearing  HENT:  Head: Normocephalic and atraumatic.  Eyes: Pupils are equal, round, and reactive to light.  Neck: Normal range of motion. Neck supple.  Cardiovascular: Normal rate, regular rhythm and normal heart sounds.   Pulmonary/Chest: Effort normal and breath sounds normal. No respiratory distress. He has no wheezes. He has no rales. He exhibits no tenderness.  Abdominal: Soft. Bowel sounds are normal. There is no tenderness. There is no rebound and no guarding.  Musculoskeletal: Normal range of motion. He exhibits no edema.  Lymphadenopathy:    He has no cervical adenopathy.  Neurological: He is alert and oriented to person, place, and time.  Skin: Skin is warm and dry. No rash noted.  Psychiatric: He has a normal mood and affect.    ED Course  Procedures (including critical care time)  Results for orders placed during the hospital encounter of 12/12/11  CBC      Component Value Range   WBC 15.0 (*) 4.0 - 10.5 K/uL   RBC 5.02  4.22 - 5.81 MIL/uL   Hemoglobin 16.6  13.0 - 17.0 g/dL   HCT 56.2  13.0 - 86.5 %   MCV 91.0  78.0 - 100.0 fL   MCH 33.1  26.0 - 34.0 pg   MCHC 36.3 (*) 30.0 - 36.0 g/dL   RDW 78.4  69.6 - 29.5 %   Platelets 274  150 - 400 K/uL  POCT I-STAT, CHEM 8      Component Value Range   Sodium 140  135 - 145 mEq/L   Potassium 3.9  3.5 - 5.1 mEq/L   Chloride 103  96 - 112 mEq/L   BUN 8  6 - 23 mg/dL   Creatinine, Ser 2.84  0.50 - 1.35 mg/dL   Glucose, Bld 132 (*) 70 - 99 mg/dL   Calcium, Ion 4.40  1.02 - 1.23 mmol/L   TCO2 25  0 - 100 mmol/L   Hemoglobin 16.7  13.0 - 17.0 g/dL   HCT 72.5  36.6 - 44.0 %  POCT I-STAT TROPONIN I      Component Value Range   Troponin i, poc 0.00  0.00 - 0.08 ng/mL   Comment 3            No results  found.    Date: 12/12/2011  Rate: 68  Rhythm: normal sinus rhythm  QRS Axis: normal  Intervals: normal  ST/T Wave abnormalities: ST elevations anteriorly and ST elevations laterally  Conduction Disutrbances:none  Narrative Interpretation:   Old EKG Reviewed: changes noted   1. STEMI (ST elevation myocardial infarction)       MDM  Patient received aspirin and nitroglycerin by EMS. He was given heparin bolus and morphine here in emergency department. His blood pressure remained stable. He was taken to the Cath Lab.  CRITICAL CARE Performed by: Zipporah Finamore   Total critical care time: 20  Critical care time was exclusive of separately billable procedures and treating other patients.  Critical care was necessary to treat or prevent imminent or life-threatening deterioration.  Critical care was time spent personally by me on the following activities: development of treatment plan with patient and/or surrogate as well as nursing, discussions with consultants, evaluation of patient's response to treatment, examination of patient, obtaining history from patient or surrogate, ordering and performing treatments and interventions, ordering and review of laboratory studies, ordering and review of radiographic studies, pulse oximetry and re-evaluation of patient's condition.         Rolan Bucco, MD 12/12/11 8119  Rolan Bucco, MD 12/12/11 0800

## 2011-12-12 NOTE — Progress Notes (Signed)
Chaplain responded to Code STEMI and provided support for patient and family

## 2011-12-12 NOTE — H&P (Signed)
History and Physical   Patient ID: Jonathan Fischer MRN: [id number], DOB/AGE: [birth year] 41 y.o. Date of Encounter: 12/12/2011  Primary Physician: No primary provider on file. Primary Cardiologist: PJ  Chief Complaint:  STEMI  HPI: Jonathan Fischer is a 41 year old male with a history of coronary artery disease. At 6:15 this morning he had onset of substernal 10/10 chest pain. He was short of breath and also states it is difficult to breathe. He was nauseated recently before the chest pain began, but had no vomiting. There was no diaphoresis. He took nitroglycerin at home without relief. EMS was called. They gave him aspirin 81 mg x4 and sublingual nitroglycerin x3. There was no relief. In the emergency room, he received morphine 4 mg, heparin 4000 units and Zofran 4 mg. His nausea improved but he is still having ongoing pain. His ECG is newly abnormal. He is being taken emergently to the cath lab.  Past Medical History  Diagnosis Date  . MI (myocardial infarction) 10/2005    ANTERIOR  . MI, acute, non ST segment elevation 04/2008    WITH INTERVENTION TO THE RIGHT CORONARY  . MI (myocardial infarction) 03/2009    ANTERIOR  . Obesity   . Hyperlipidemia   . Coronary artery disease   . LV dysfunction   . ICD (implantable cardiac defibrillator) in place   . Tobacco abuse   . Systolic heart failure      Surgical History:  Past Surgical History  Procedure Date  . Cardiac catheterization 03/31/2009  . Cardiac catheterization 05/08/2008    CARDIAC SIZE AND SILHOUETTE NORMAL. THERE WAS ANTERIOR APICAL HYPOKINESIS WITH EF 35-40%  . Coronary angioplasty     BALLOON ANGIOPLASTY TO THE LAD WITH THROMBUS EXTRACTION OF THE PREVIOUSLY STENTED SEGMENT AND NEW STENT PLACEMENT TO THE LEFT CIRCUMFLEX. EF IS DOWN IN THE 30% RANGE  . Cardiac defibrillator placement 11/2009     I have reviewed the patient's current medications. Prior to Admission medications   Medication Sig Start Date End Date Taking?  Authorizing Provider  aspirin 325 MG tablet Take 325 mg by mouth daily.      Historical Provider, MD  buPROPion (WELLBUTRIN SR) 150 MG 12 hr tablet Take 150 mg by mouth daily.      Historical Provider, MD  EFFIENT 10 MG TABS TAKE ONE TABLET BY MOUTH EVERY DAY 09/28/10   Oday Ridings M [place], MD  furosemide (LASIX) 20 MG tablet Take 20 mg by mouth as needed. 04/16/11 04/15/12  Rosalio Macadamia, NP  LIPITOR 80 MG tablet TAKE ONE TABLET BY MOUTH EVERY DAY 12/09/10   Rosalio Macadamia, NP  metoprolol succinate (TOPROL-XL) 25 MG 24 hr tablet TAKE ONE TABLET BY MOUTH EVERY DAY 09/28/10   Jamaurion Slemmer M [place], MD  nitroGLYCERIN (NITROSTAT) 0.4 MG SL tablet DISSOLVE ONE TABLET UNDER THE TONGUE AS NEEDED 09/28/10   Lillyana Majette M [place], MD  omeprazole (PRILOSEC) 20 MG capsule TAKE ONE CAPSULE BY MOUTH EVERY DAY 09/28/10   Tamie Minteer M [place], MD  ramipril (ALTACE) 10 MG capsule TAKE ONE CAPSULE BY MOUTH EVERY DAY 09/28/10   Roark Rufo M [place], MD  spironolactone (ALDACTONE) 25 MG tablet Take 1 tablet (25 mg total) by mouth daily. 04/20/11 04/19/12  Rosalio Macadamia, NP  TRILIPIX 135 MG capsule TAKE ONE CAPSULE BY MOUTH EVERY DAY 09/28/10   Josiephine Simao M [place], MD    Scheduled Meds:    . [COMPLETED] bivalirudin      . [COMPLETED] heparin      . [  COMPLETED] heparin  4,000 Units Intravenous STAT  . [COMPLETED] lidocaine      . [COMPLETED]  morphine injection  4 mg Intravenous Once  . [COMPLETED] nitroGLYCERIN      . [COMPLETED] prasugrel      . [COMPLETED] verapamil       Continuous Infusions:    . sodium chloride    . bivalirudin (ANGIOMAX) infusion 5 mg/mL (Cath Lab,ACS,PCI indication) 1.75 mg/kg/hr (12/12/11 0819)  . [DISCONTINUED] bivalirudin (ANGIOMAX) infusion 5 mg/mL (Cath Lab,ACS,PCI indication)     PRN Meds:.  Allergies: No Known Allergies  History   Social History  . Marital Status: Married    Spouse Name: N/A    Number of Children: N/A  . Years of Education: N/A   Occupational History  . Not on file.   Social History  Main Topics  . Smoking status: Current Every Day Smoker -- 1.5 packs/day for 24 years    Types: Cigarettes  . Smokeless tobacco: Never Used  . Alcohol Use: No  . Drug Use: No  . Sexually Active: Yes   Other Topics Concern  . Not on file   Social History Narrative  . No narrative on file     Family History  Problem Relation Age of Onset  . Cancer Mother   . Heart disease Father    Family Status  Relation Status Death Age  . Father Deceased     Review of Systems:  Jonathan Fischer states he is compliant with all of his medications including Effient. He has had a toothache for the last 2 days and associated nausea but no other recent medical problems. He is having no bleeding issues. He coughs at times but it has been nonproductive. He's had no fevers or chills. Full 14-point review of systems otherwise negative except as noted above.   Physical Exam: Blood pressure 144/87, temperature 97.8 F (36.6 C), resp. rate 15, height 6\' 1"  (1.854 m), weight 257 lb 15 oz (117 kg), SpO2 100.00%. General: Well developed, well nourished, male in acute distress. Head: Normocephalic, atraumatic, sclera non-icteric, no xanthomas, nares are without discharge. Dentition: good Neck: No carotid bruits. JVD not elevated. No thyromegally Lungs: Good expansion bilaterally. without wheezes or rhonchi. Few Rales Heart: Regular rate and rhythm with S1 S2.  No S3 or S4.  No murmur, no rubs, or gallops appreciated. Abdomen: Soft, non-tender, non-distended with normoactive bowel sounds. No hepatomegaly. No rebound/guarding. No obvious abdominal masses. Msk:  Strength and tone appear normal for age. No joint deformities or effusions, no spine or costo-vertebral angle tenderness. Extremities: No clubbing or cyanosis. No edema.  Distal pedal pulses are 2+ in 4 extrem Neuro: Alert and oriented X 3. Moves all extremities spontaneously. No focal deficits noted. Psych:  Responds to questions appropriately with a flat  affect due to pain. Skin: No rashes or lesions noted  Labs:   Lab Results  Component Value Date   WBC 15.0* 12/12/2011   HGB 16.7 12/12/2011   HCT 49.0 12/12/2011   MCV 91.0 12/12/2011   PLT 274 12/12/2011    Basename 12/12/11 0736  INR 1.08     Lab 12/12/11 0746 12/12/11 0736  NA 140 --  K 3.9 --  CL 103 --  CO2 -- 27  BUN 8 --  CREATININE 0.80 --  CALCIUM -- 9.0  PROT -- 7.4  BILITOT -- 0.3  ALKPHOS -- 61  ALT -- 35  AST -- 23  GLUCOSE 181* --   No results found  for this basename: CKTOTAL:4,CKMB:4,TROPONINI:4 in the last 72 hours  Basename 12/12/11 0743  TROPIPOC 0.00    Radiology/Studies:  No results found.   ECG: SR, rate 68 with ST elevation in V3, 4 and 5. ST depression with an inverted T wave in lead 3  ASSESSMENT AND PLAN:  Principal Problem:  *STEMI (ST elevation myocardial infarction) Active Problems:  TOBACCO USER  AUTOMATIC IMPLANTABLE CARDIAC DEFIBRILLATOR SITU  Chronic systolic congestive heart failure  Obesity  Hyperlipidemia  Jonathan Fischer is being taken emergently to the cath lab with further evaluation and treatment depending on the results. We will continue his home medications and screen for cardiac risk factors.  SignedBjorn Loser Barrett PA-C 12/12/2011, 8:28 AM   Patient seen and examined and history reviewed. Agree with above findings and plan. Patient with known CAD with prior stenting of LAD, LCX, and RCA. LAD stented remotely for an anterior MI. He had a recurrent MI with LAD stent occlusion in 2010. This was treated with thrombectomy and PTCA. He now presents with an anterolateral STEMI with new ST elevation in leads 1, avl, V3-6. We will proceed with emergent cardiac cath and PCI.  Theron Arista [facility] 12/12/2011 9:12 AM

## 2011-12-13 ENCOUNTER — Encounter: Payer: Medicaid Other | Admitting: *Deleted

## 2011-12-13 DIAGNOSIS — I509 Heart failure, unspecified: Secondary | ICD-10-CM

## 2011-12-13 DIAGNOSIS — F17200 Nicotine dependence, unspecified, uncomplicated: Secondary | ICD-10-CM

## 2011-12-13 DIAGNOSIS — Z95810 Presence of automatic (implantable) cardiac defibrillator: Secondary | ICD-10-CM

## 2011-12-13 DIAGNOSIS — I2510 Atherosclerotic heart disease of native coronary artery without angina pectoris: Secondary | ICD-10-CM

## 2011-12-13 DIAGNOSIS — I219 Acute myocardial infarction, unspecified: Secondary | ICD-10-CM

## 2011-12-13 DIAGNOSIS — I5022 Chronic systolic (congestive) heart failure: Secondary | ICD-10-CM

## 2011-12-13 DIAGNOSIS — E785 Hyperlipidemia, unspecified: Secondary | ICD-10-CM

## 2011-12-13 LAB — COMPREHENSIVE METABOLIC PANEL
ALT: 46 U/L (ref 0–53)
BUN: 9 mg/dL (ref 6–23)
CO2: 29 mEq/L (ref 19–32)
Calcium: 9.1 mg/dL (ref 8.4–10.5)
Creatinine, Ser: 0.9 mg/dL (ref 0.50–1.35)
GFR calc Af Amer: >90 mL/min (ref >90)
GFR calc non Af Amer: >90 mL/min (ref >90)
Glucose, Bld: 111 mg/dL — ABNORMAL HIGH (ref 70–99)

## 2011-12-13 LAB — LIPID PANEL
HDL: 28 mg/dL — ABNORMAL LOW (ref >39)
LDL Cholesterol: 115 mg/dL — ABNORMAL HIGH (ref 0–99)
Triglycerides: 264 mg/dL — ABNORMAL HIGH (ref <150)

## 2011-12-13 LAB — CBC WITH DIFFERENTIAL/PLATELET
Basophils Absolute: 0 10*3/uL (ref 0.0–0.1)
Basophils Relative: 0 % (ref 0–1)
Hemoglobin: 14.5 g/dL (ref 13.0–17.0)
MCHC: 34.3 g/dL (ref 30.0–36.0)
Monocytes Relative: 6 % (ref 3–12)
Neutro Abs: 6.7 10*3/uL (ref 1.7–7.7)
Neutrophils Relative %: 65 % (ref 43–77)
RBC: 4.56 MIL/uL (ref 4.22–5.81)
WBC: 10.4 10*3/uL (ref 4.0–10.5)

## 2011-12-13 LAB — POCT I-STAT, CHEM 8
BUN: 8 mg/dL (ref 6–23)
Calcium, Ion: 1.1 mmol/L — ABNORMAL LOW (ref 1.12–1.23)
Creatinine, Ser: 0.6 mg/dL (ref 0.50–1.35)
TCO2: 19 mmol/L (ref 0–100)

## 2011-12-13 MED ORDER — MAGIC MOUTHWASH W/LIDOCAINE
15.0000 mL | Freq: Four times a day (QID) | ORAL | Status: DC | PRN
  Administered 2011-12-13: 15 mL via ORAL
  Filled 2011-12-13 (×2): qty 15

## 2011-12-13 MED ORDER — TRAMADOL HCL 50 MG PO TABS
50.0000 mg | ORAL_TABLET | Freq: Four times a day (QID) | ORAL | Status: DC | PRN
  Administered 2011-12-13 (×2): 50 mg via ORAL
  Filled 2011-12-13 (×3): qty 1

## 2011-12-13 MED FILL — Dextrose Inj 5%: INTRAVENOUS | Qty: 50 | Status: AC

## 2011-12-13 NOTE — Progress Notes (Addendum)
CARDIAC REHAB PHASE I   PRE:  Rate/Rhythm: 72 SR  BP:  Supine:   Sitting: 138/71  Standing:    SaO2: 94 RA  MODE:  Ambulation: 700 ft   POST:  Rate/Rhythem: 78 SR  BP:  Supine:   Sitting: 135/78  Standing:    SaO2: 96 RA 1125-1235 Pt tolerated ambulation well without c/o of cp or SOB. VS stable. Completed MI education with pt and wife. Discussed smoking cessation with pt. He acts numb to all education and told me to give education materials to his wife so she has something to read. Pt voices  that he does not believe that smoking causes heart disease. Gave pt tips for quitting and coaching contact number. He is not motivated at all to quitting.He declines Outpt. CRP not interested.Did discuss with case manger his wife's concerns over being able to afford Effient and his need for dental work. Pt has toothache now and one tooth broken off with gum line.Pt and wife do voice understanding.   Beatrix Fetters

## 2011-12-13 NOTE — Progress Notes (Signed)
Pt c/o toothache; states that he cannot relax like MD told him too with tooth pain; MD made aware of pt's toothache.  Orders recieved

## 2011-12-13 NOTE — Care Management Note (Signed)
    Page 1 of 1   12/13/2011     10:12:57 AM   CARE MANAGEMENT NOTE 12/13/2011  Patient:  Jonathan Fischer, Jonathan Fischer   Account Number:  [id number]  Date Initiated:  12/13/2011  Documentation initiated by:  Junius Creamer  Subjective/Objective Assessment:   adm w mi     Action/Plan:   lives w wife   Anticipated DC Date:     Anticipated DC Plan:        DC Planning Services  CM consult      Choice offered to / List presented to:             Status of service:   Medicare Important Message given?   (If response is "NO", the following Medicare IM given date fields will be blank) Date Medicare IM given:   Date Additional Medicare IM given:    Discharge Disposition:  HOME/SELF CARE  Per UR Regulation:  Reviewed for med. necessity/level of care/duration of stay  If discussed at Long Length of Stay Meetings, dates discussed:    Comments:  12/2 10:12a debbie Makayli Bracken rn,bsn will give pt effient copay assit card and 30day free card.

## 2011-12-13 NOTE — Progress Notes (Signed)
Patient Name: Jonathan Fischer Date of Encounter: 12/13/2011     Principal Problem:  *STEMI (ST elevation myocardial infarction) Active Problems:  TOBACCO USER  AUTOMATIC IMPLANTABLE CARDIAC DEFIBRILLATOR SITU  Chronic systolic congestive heart failure  Obesity  Hyperlipidemia   SUBJECTIVE: Feels well this AM. No chest pain, SOB, lightheadedness or palpitations.   OBJECTIVE  Filed Vitals:   12/12/11 2050 12/13/11 0000 12/13/11 0400 12/13/11 0718  BP: 100/58 108/63 128/78 122/68  Pulse:      Temp:  97.4 F (36.3 C) 98.2 F (36.8 C) 97.8 F (36.6 C)  TempSrc:  Oral Oral Oral  Resp:  20 18 20   Height:      Weight:      SpO2:  96% 94% 95%    Intake/Output Summary (Last 24 hours) at 12/13/11 0754 Last data filed at 12/13/11 0000  Gross per 24 hour  Intake   1335 ml  Output    700 ml  Net    635 ml   Weight change:   PHYSICAL EXAM  General: Well developed, well nourished, in no acute distress. Head: Normocephalic, atraumatic, sclera non-icteric, no xanthomas, nares are without discharge.  Neck: Supple without bruits or JVD. Lungs:  Resp regular and unlabored, CTA. Heart: RRR no s3, s4, or murmurs. Abdomen: Soft, non-tender, non-distended, BS + x 4.  Msk:  Strength and tone appears normal for age. Extremities: No clubbing, cyanosis or edema. DP/PT/Radials 2+ and equal bilaterally. Neuro: Alert and oriented X 3. Moves all extremities spontaneously. Psych: Normal affect.  LABS:  Recent Labs  [facility] 12/13/11 0500 12/12/11 0955   WBC 10.4 11.4*   HGB 14.5 17.1*   HCT 42.3 47.8   MCV 92.8 91.9   PLT 215 218   Lab 12/13/11 0500 12/12/11 0955 12/12/11 0746 12/12/11 0736  NA 139 -- 140 137  K 4.0 -- 3.9 3.9  CL 99 -- 103 100  CO2 29 -- -- 27  BUN 9 -- 8 9  CREATININE 0.90 0.80 0.80 --  CALCIUM 9.1 -- -- 9.0  PROT 6.8 -- -- --  BILITOT 0.3 -- -- --  ALKPHOS 55 -- -- --  ALT 46 -- -- --  AST 104* -- -- --  AMYLASE -- -- -- --  LIPASE -- -- -- --    GLUCOSE 111* -- 181* 196*   Recent Labs  Basename 12/12/11 0955   HGBA1C 5.6   Recent Labs  Basename 12/12/11 2106 12/12/11 1538 12/12/11 0955   CKTOTAL -- -- --   CKMB -- -- --   CKMBINDEX -- -- --   TROPONINI >20.00* >20.00* 11.09*   Basename 12/12/11 0955  TSH 0.633  T4TOTAL --  T3FREE --  THYROIDAB --     TELE: sinus bradycardia overnight, high 40s-50s, intermittent NSVT (26 beat run overnight)  ECG: sinus bradycardia, 52 bpm resolving ST changes, progressive T wave inversions V2-V5, Q waves V1-V3   Radiology/Studies:  Portable Chest X-ray 1 View  12/12/2011  *RADIOLOGY REPORT*  Clinical Data: ST elevation MI.  PORTABLE CHEST - 1 VIEW  Comparison: Two-view chest x-ray 06/04/2010, 11/13/2009, 03/27/2009.  Findings: Cardiac silhouette enlarged with interval increase in heart size since 2012.  Indwelling left subclavian pacing defibrillator unchanged appears intact.  Elevation of the right hemidiaphragm with atelectasis and/or scarring at the right lung base.  Lungs otherwise clear.  Pulmonary vascularity normal.  IMPRESSION: Cardiomegaly.  Elevation of the right hemidiaphragm with right basilar atelectasis and/or scarring.  No acute cardiopulmonary disease otherwise.  Original Report Authenticated By: Hulan Saas, M.D.     Current Medications:     . aspirin EC  81 mg Oral Daily  . atorvastatin  80 mg Oral q1800  . [COMPLETED] bivalirudin      . buPROPion  150 mg Oral Daily  . enoxaparin  40 mg Subcutaneous Q24H  . fenofibrate  160 mg Oral QODAY  . [COMPLETED] heparin      . [COMPLETED] lidocaine      . metoprolol succinate  25 mg Oral Daily  . [COMPLETED] nitroGLYCERIN      . pantoprazole  40 mg Oral Daily  . [COMPLETED] prasugrel      . prasugrel  10 mg Oral Daily  . ramipril  10 mg Oral Daily  . sodium chloride  3 mL Intravenous Q12H  . spironolactone  25 mg Oral Daily  . [COMPLETED] verapamil        ASSESSMENT AND PLAN:  1. Anterior STEMI/CAD- s/p  DES-prox LAD stenosis. 30% mid LCx, mid RCA & mid LCx stents patent. LVEF 25-30%, mid-distal anterolateral wall AK, apex dilated, dyskinetic. Continue DAPT- ASA/Effient indefinitely. Continue ACEi, BB, high-dose statin, NTG SL PRN. From risk stratification standpoint, Hgb A1C 5.6%. Ambulated yesterday without incident. Continue ambulation with assistance today. Consider transfer to telemetry on fast-track pathway.   2. Ischemic cardiomyopathy- s/p [employer] ICD. History of CAD, largely involving anterior wall, with multiple prior PCIs. LVEF 25-30%, worse from 04/2011 echo- EF 35-40%, grade 1 dd.  Euvolemic on exam. NSVT noted on telemetry overnight assoc w/ tachy-palpitations. Not unexpected given ICM and recent reperfusion. Electrolytes including Mg WNL. Continue to monitor. Continue ACEi, BB, spironolactone.   3. GERD- continue PPI.   4. Hyperlipidemia- LDL 82 in 04/2011. Continue high-dose atorvastatin. Recheck lipid panel.   5. Tobacco abuse- Very young male with significant CAD history and multiple PCIs. Still continues to smoke cigarettes. Discussed importance of this moving forward. Could consider Wellbutrin + nicotine replacement as a means of cessation. He will need to follow-up with his PCP regularly for this to achieve and maintain successful cessation.  6. NSVT early post reperfusion therapy     Signed, R. Hurman Horn, PA-C 12/13/2011, 7:54 AM Patient seen and examined and history reviewed. Agree with above findings and plan. He had NSVT last pm with symptoms of fluttering. No recurrent chest pain or SOB. Cath site right radial is ok. Patient very upset that he is not going home today. He has poor insight into his condition and does not grasp the magnitude of his third anterior MI. Reports compliance with meds but I question this in light of increased lipids since April. Shows no interest in smoking cessation. Long term prognosis poor given lack of insight and compliance issues. We  will transfer to telemetry today. Repeat Echo.  Theron Arista [facility] 12/13/2011 2:08 PM

## 2011-12-14 ENCOUNTER — Encounter (HOSPITAL_COMMUNITY): Payer: Self-pay | Admitting: *Deleted

## 2011-12-14 DIAGNOSIS — I255 Ischemic cardiomyopathy: Secondary | ICD-10-CM

## 2011-12-14 DIAGNOSIS — K0889 Other specified disorders of teeth and supporting structures: Secondary | ICD-10-CM

## 2011-12-14 DIAGNOSIS — I472 Ventricular tachycardia: Secondary | ICD-10-CM

## 2011-12-14 DIAGNOSIS — I369 Nonrheumatic tricuspid valve disorder, unspecified: Secondary | ICD-10-CM

## 2011-12-14 DIAGNOSIS — I4729 Other ventricular tachycardia: Secondary | ICD-10-CM

## 2011-12-14 HISTORY — DX: Ischemic cardiomyopathy: I25.5

## 2011-12-14 HISTORY — DX: Ventricular tachycardia: I47.2

## 2011-12-14 HISTORY — DX: Other specified disorders of teeth and supporting structures: K08.89

## 2011-12-14 HISTORY — DX: Other ventricular tachycardia: I47.29

## 2011-12-14 MED ORDER — PERFLUTREN LIPID MICROSPHERE
INTRAVENOUS | Status: AC
  Filled 2011-12-14: qty 10

## 2011-12-14 MED ORDER — PERFLUTREN LIPID MICROSPHERE
1.0000 mL | INTRAVENOUS | Status: DC | PRN
  Administered 2011-12-14: 2 mL via INTRAVENOUS
  Administered 2011-12-14: 1 mL via INTRAVENOUS
  Administered 2011-12-14 (×2): 2 mL via INTRAVENOUS
  Filled 2011-12-14: qty 10

## 2011-12-14 MED ORDER — ASPIRIN 81 MG PO TBEC: 81.0000 mg | DELAYED_RELEASE_TABLET | Freq: Every day | ORAL | Status: AC

## 2011-12-14 MED ORDER — TRAMADOL HCL 50 MG PO TABS: 50.0000 mg | ORAL_TABLET | Freq: Four times a day (QID) | ORAL | Status: DC | PRN

## 2011-12-14 MED ORDER — SODIUM CHLORIDE 0.9 % IV SOLN
INTRAVENOUS | Status: DC
  Administered 2011-12-14: 250 mL via INTRAVENOUS

## 2011-12-14 NOTE — Progress Notes (Signed)
D/c orders received; iv removed with gauze on; pt remains in stable condition, pt meds and instructions reviewed and given to pt; spoke with pt and encouraged him to stop smoking and also talked about diet modification; pt d/c to home

## 2011-12-14 NOTE — Progress Notes (Signed)
CARDIAC REHAB PHASE I   PRE:  Rate/Rhythm: 78 SR  BP:  Supine:   Sitting: 134/82  Standing:    SaO2: 97 RA  MODE:  Ambulation: 1040 ft   POST:  Rate/Rhythem: 63  BP:  Supine:   Sitting: 108/72  Standing:    SaO2: 97 RA 1030-1100 Pt tolerated ambulation well without c/o of cp or SOB. VS stable. Pt states that he feels good today and that he does not have a toothache today. Reinforced risk factor education with pt and wife. Pt still does not take this seriously and voices no interest in making changes.  Jonathan Fischer

## 2011-12-14 NOTE — Discharge Summary (Signed)
Patient seen and examined and history reviewed. Agree with above findings and plan. See rounding note from earlier today.    Theron Arista [facility] 12/14/2011 2:10 PM

## 2011-12-14 NOTE — Progress Notes (Signed)
Patient Name: Jonathan Fischer Date of Encounter: 12/14/2011     Principal Problem:  *STEMI (ST elevation myocardial infarction) Active Problems:  TOBACCO USER  AUTOMATIC IMPLANTABLE CARDIAC DEFIBRILLATOR SITU  Chronic systolic congestive heart failure  Obesity  Hyperlipidemia   SUBJECTIVE: Feels well this AM. No chest pain, SOB, lightheadedness or palpitations. Ambulated in halls yesterday without difficulty.  OBJECTIVE  Filed Vitals:   12/13/11 1156 12/13/11 1627 12/13/11 2113 12/14/11 0524  BP: 135/83 114/78 121/67 120/65  Pulse:      Temp: 97.9 F (36.6 C) 97.4 F (36.3 C) 97.4 F (36.3 C) 97.7 F (36.5 C)  TempSrc: Oral Oral Oral Oral  Resp: 20  18 18   Height:  6\' 1"  (1.854 m)    Weight:    119.886 kg (264 lb 4.8 oz)  SpO2: 96%  97% 96%   No intake or output data in the 24 hours ending 12/14/11 0658 Weight change: 2.886 kg (6 lb 5.8 oz)  PHYSICAL EXAM  General: Well developed, well nourished, in no acute distress. Head: Normocephalic, atraumatic, sclera non-icteric, no xanthomas, nares are without discharge.  Neck: Supple without bruits or JVD. Lungs:  Resp regular and unlabored, CTA. Heart: RRR no s3, s4, or murmurs. Abdomen: Soft, non-tender, non-distended, BS + x 4.  Msk:  Strength and tone appears normal for age. Extremities: No clubbing, cyanosis or edema. DP/PT/Radials 2+ and equal bilaterally. Neuro: Alert and oriented X 3. Moves all extremities spontaneously. Psych: Normal affect.  LABS:  Recent Labs  [facility] 12/13/11 0500 12/12/11 0955   WBC 10.4 11.4*   HGB 14.5 17.1*   HCT 42.3 47.8   MCV 92.8 91.9   PLT 215 218    Lab 12/13/11 0500 12/12/11 0955 12/12/11 0821 12/12/11 0746 12/12/11 0736  NA 139 -- 119* 140 --  K 4.0 -- 3.5 3.9 --  CL 99 -- 85* 103 --  CO2 29 -- -- -- 27  BUN 9 -- 8 8 --  CREATININE 0.90 0.80 0.60 -- --  CALCIUM 9.1 -- -- -- 9.0  PROT 6.8 -- -- -- --  BILITOT 0.3 -- -- -- --  ALKPHOS 55 -- -- -- --  ALT 46 -- --  -- --  AST 104* -- -- -- --  AMYLASE -- -- -- -- --  LIPASE -- -- -- -- --  GLUCOSE 111* -- 153* 181* --   Recent Labs  Basename 12/12/11 0955   HGBA1C 5.6   Recent Labs  Basename 12/12/11 2106 12/12/11 1538 12/12/11 0955   CKTOTAL -- -- --   CKMB -- -- --   CKMBINDEX -- -- --   TROPONINI >20.00* >20.00* 11.09*    Basename 12/12/11 0955  TSH 0.633  T4TOTAL --  T3FREE --  THYROIDAB --     TELE: sinus rhythm with occ PVC couplet.  ECG: sinus bradycardia, 52 bpm resolving ST changes, progressive T wave inversions V2-V5, Q waves V1-V3, evolving anterior MI.   Radiology/Studies:  Portable Chest X-ray 1 View  12/12/2011  *RADIOLOGY REPORT*  Clinical Data: ST elevation MI.  PORTABLE CHEST - 1 VIEW  Comparison: Two-view chest x-ray 06/04/2010, 11/13/2009, 03/27/2009.  Findings: Cardiac silhouette enlarged with interval increase in heart size since 2012.  Indwelling left subclavian pacing defibrillator unchanged appears intact.  Elevation of the right hemidiaphragm with atelectasis and/or scarring at the right lung base.  Lungs otherwise clear.  Pulmonary vascularity normal.  IMPRESSION: Cardiomegaly.  Elevation of the right hemidiaphragm with right basilar atelectasis and/or  scarring.  No acute cardiopulmonary disease otherwise.   Original Report Authenticated By: Hulan Saas, M.D.     Current Medications:     . aspirin EC  81 mg Oral Daily  . atorvastatin  80 mg Oral q1800  . buPROPion  150 mg Oral Daily  . enoxaparin  40 mg Subcutaneous Q24H  . fenofibrate  160 mg Oral QODAY  . metoprolol succinate  25 mg Oral Daily  . pantoprazole  40 mg Oral Daily  . prasugrel  10 mg Oral Daily  . ramipril  10 mg Oral Daily  . sodium chloride  3 mL Intravenous Q12H  . spironolactone  25 mg Oral Daily    ASSESSMENT AND PLAN:  1. Anterior STEMI/CAD- s/p DES-prox LAD stenosis/ stent occlusion. 30% mid LCx, mid RCA & mid LCx stents patent. LVEF 25-30%, mid-distal anterolateral wall  AK, apex dilated, dyskinetic. Echo pending Continue DAPT- ASA/Effient indefinitely. May need drug assistance for Effient. Continue ACEi, BB, high-dose statin, NTG SL PRN. Continue ambulation with assistance today. If no LV thrombus by Echo can be discharged home later today.  2. Ischemic cardiomyopathy- s/p Medtronic ICD. History of CAD, largely involving anterior wall, with multiple prior PCIs. Echo pending.Euvolemic on exam.  Electrolytes including Mg WNL. Continue to monitor. Continue ACEi, BB, spironolactone.   3. GERD- continue PPI.   4. Hyperlipidemia- LDL 82 in 04/2011. Continue high-dose atorvastatin and fenofibrate.   5. Tobacco abuse- Very young male with significant CAD history and multiple PCIs. Still continues to smoke cigarettes. Discussed importance of this moving forward.Patient has no desire to Quit.  6. NSVT early post reperfusion therapy- now occ couplets. ICD in place.  He has poor insight into his condition and does not grasp the magnitude of his third anterior MI. Reports compliance with meds but I question this in light of increased lipids since April. Shows no interest in smoking cessation. Long term prognosis poor given lack of insight and compliance issues.   Theron Arista [facility] 12/14/2011 6:58 AM

## 2011-12-14 NOTE — Discharge Summary (Signed)
Discharge Summary   Patient ID: Jonathan Fischer,  MRN: [id number], DOB/AGE: 03-13-1970 41 y.o.  Admit date: 12/12/2011 Discharge date: 12/14/2011  Primary Physician: No primary provider on file. Primary Cardiologist: Swaziland, Peter, MD  Discharge Diagnoses Principal Problem:  *STEMI (ST elevation myocardial infarction)  - s/p cardiac cath 12/12/11: DES-prox LAD stenosis 30% mid LCx, mid RCA & mid LCx stents patent. LVEF 25-30%, mid-distal anterolateral wall AK, apex dilated, dyskinetic  Active Problems:  CAD  - Continue DAPT indefinitely- ASA/Effient, BB, ACEi, statin, NTG SL PRN  TOBACCO USER  - Lacks insight and refuses cessation, does not identify this as a CAD risk factor  AUTOMATIC IMPLANTABLE CARDIAC DEFIBRILLATOR SITU  Ischemic cardiomyopathy/chronic systolic congestive heart failure  - LVEF 25-30% on cath, reduced from 35-40% on 04/13 echo  - 12/14/11 echo: LVEF 25-30%, septal apical and anterior wall AK, mild LA dilatation, possible apical thrombus (see below)  - Medtronic ICD in place  Obesity  Hyperlipidemia  - contionue high-dose atorvastatin  Tooth pain  - advised to establish/follow-up with dentist  - given magic mouthwash and tramadol with relief while inpatient, will prescribe tramadol on discharge  NSVT  - increased ventricular ectopy post-reperfusion, NSVT with associated tachy-palpitations, no lightheadedness or syncope  - Electrolytes and Mg WNL, continued on BB   Allergies No Known Allergies  Diagnostic Studies/Procedures  PORTABLE CHEST X-RAY - 12/12/11  IMPRESSION:  Cardiomegaly. Elevation of the right hemidiaphragm with right  basilar atelectasis and/or scarring. No acute cardiopulmonary  disease otherwise.  CARDIAC CATHETERIZATION + PCI - 12/12/11  PROCEDURAL FINDINGS  Hemodynamics:  AO 158/96 with a mean of 123 mmHg  LV 156/28 mmHg  Coronary angiography:  Coronary dominance: right  Left mainstem: normal.  Left anterior descending (LAD):  the LAD is occluded in the proximal vessel at the site of the prior stent. There is TIMI grade 0 flow.  Left circumflex (LCx): the left circumflex coronary has 30% stenosis in the mid vessel prior to the stent. The stent is widely patent. There are 2 small marginal branches proximally and then the circumflex terminates in 2 moderate-sized marginal branches. These are all without significant disease.  Right coronary artery (RCA): the right coronary is a dominant vessel. A stent in the mid vessel is widely patent. There are minor irregularities in the RCA.  Left ventriculography: Left ventricular systolic function is abnormal. The mid to distal anterolateral wall is akinetic. The apex is dilated and dyskinetic. The distal inferior wall is also akinetic. Overall ejection fraction is estimated at 25-30%.   PCI Data:  Vessel - LAD/Segment - proximal  Percent Stenosis (pre) 100%  TIMI-flow 0  Stent 3.0 x 32 mm Promus stent  Percent Stenosis (post) 0%  TIMI-flow (post) 3  Final Conclusions:  1. Single vessel occlusive coronary disease involving the proximal LAD.  2. Continued excellent patency of the stents in the mid left circumflex and mid RCA.  3. Severe left ventricular dysfunction.  4. Successful reperfusion of the LAD and repeat stenting of the proximal LAD with a drug-eluting stent.   2D ECHOCARDIOGRAM -12/14/11  Study Conclusions  - Left ventricle: Septal apical and anterior wall akinesis The cavity size was moderately dilated. Systolic function was severely reduced. The estimated ejection fraction was in the range of 25% to 30%. - Aortic valve: Trivial regurgitation. - Left atrium: The atrium was mildly dilated. - Atrial septum: No defect or patent foramen ovale was Identified.  - Impressions: Apical images suggested possible apical thrombus. Definity given and  no obvious thrombus seen. Cannot R/O a small layer of mural thrombus but certainly no mobile thrombus.  History of  Present Illness  Jonathan Fischer is a 41yo male who was admitted to [facility] on 12/12/11 with the above problem list. He has a significant history of known CAD s/p prior LAD, LCx and RCA stenting remotely for an anterior MI, recurrent MI in 2010 with LAD stent occlusion s/p thrombectomy and PTCA. He continues to smoke 1/2 PPD. He presented on the day of admission with sudden onset SSCP rated at a 10/10 with associated SOB and nausea, unresponsive to NTG. EMS was called and he received a full-dose ASA and NTG x 3 w/o relief. In the ED, EKG revealed anterior ST elevations. CXR as above revealed no acute process. Code STEMI was called, he was heparinized and transferred emergently to the cath lab.   Hospital Course   This revealed 100% prox LAD stenosis s/p DES with 0% post-PCI stenosis, mid LCX and mid RCA stent patency, LVEF 25-30% with mid-distal anterolateral AK, dilated apex with DK. He tolerated the procedure well, without complications. He was transferred to CCU in good condition. He ambulated well with cardiac rehab. He did develop intermittent ventricular ectopy from PVCs to NSVT (26 beat run in the CCU assoc w/ tachy-palpitations, no lightheadedness or syncope). Tobacco cessation was strongly encouraged, however the patient lacked appropriate insight into his cardiac condition and risk reduction moving forward. He did complain of tooth pain for which magic mouthwash and tramadol was prescribed with improvement. He was transferred to telemetry, remained asymptomatic overnight and continued to ambulate without difficulty. A 2D echo was ordered to assess LVEF and r/o thrombus. As above, this revealed LVEF 25-30%, septal apical and anterior wall AK and possible small layer of mural apical thrombus without mobility. Not a candidate for anticoagulation d/t noncompliance. Also on DAPT. Does have an ICD in place.   Dr. Swaziland evaluated the patient and deemed him stable for discharge. He will be  discharged on the medications below. He will follow-up in </= 7 days given STEMI status. He will need to follow-up with a PCP regarding tobacco cessation and assistance moving forward- he declined assistance while inpatient. This information, including post-cath instructions, has been clearly outlined in the discharge AVS. From a risk stratification standpoint, Hgb A1C returned at 5.6%, LDL 115. There have been multiple observations by Dr. Swaziland and cardiac rehab this admission regarding the patient's poor insight (does not believe tobacco causes heart disease, unwilling to receive education). This will be a major hindrance to his management going forward. Case management has provided medication assistance with Effient card.   Discharge Vitals:  Blood pressure 134/82, pulse 65, temperature 97.7 F (36.5 C), temperature source Oral, resp. rate 18, height 6\' 1"  (1.854 m), weight 119.886 kg (264 lb 4.8 oz), SpO2 96.00%.   Labs: Recent Labs  Basename 12/13/11 0500 12/12/11 0955   WBC 10.4 11.4*   HGB 14.5 17.1*   HCT 42.3 47.8   MCV 92.8 91.9   PLT 215 218    Lab 12/13/11 0500 12/12/11 0955 12/12/11 0821 12/12/11 0746 12/12/11 0736  NA 139 -- 119* 140 --  K 4.0 -- 3.5 3.9 --  CL 99 -- 85* 103 --  CO2 29 -- -- -- 27  BUN 9 -- 8 8 --  CREATININE 0.90 0.80 0.60 -- --  CALCIUM 9.1 -- -- -- 9.0  PROT 6.8 -- -- -- --  BILITOT 0.3 -- -- -- --  ALKPHOS 55 -- -- -- --  ALT 46 -- -- -- --  AST 104* -- -- -- --  AMYLASE -- -- -- -- --  LIPASE -- -- -- -- --  GLUCOSE 111* -- 153* 181* --   Recent Labs  Basename 12/12/11 0955   HGBA1C 5.6   Recent Labs  Basename 12/12/11 2106 12/12/11 1538 12/12/11 0955   CKTOTAL -- -- --   CKMB -- -- --   CKMBINDEX -- -- --   TROPONINI >20.00* >20.00* 11.09*   Recent Labs  Basename 12/13/11 0500   CHOL 196   HDL 28*   LDLCALC 115*   TRIG 264*   CHOLHDL 7.0   LDLDIRECT --    Basename 12/12/11 0955  TSH 0.633  T4TOTAL --  T3FREE --  THYROIDAB  --    Disposition:  Discharge Orders    Future Appointments: Provider: Department: Dept Phone: Center:   12/17/2011 8:50 AM Beatrice Lecher, PA [facility] Main Office [place]) [phone] [id number]     Future Orders Please Complete By Expires   Diet - low sodium heart healthy      Increase activity slowly        Follow-up Information    Follow up with Tereso Newcomer, PA. On 12/17/2011. (At 8:50 AM for follow-up after this hospitalization. )    Contact information:   [address] [place] [address] [phone]        Discharge Medications:    Medication List     As of 12/14/2011  2:04 PM    START taking these medications         aspirin 81 MG EC tablet   Take 1 tablet (81 mg total) by mouth daily.   Replaces: aspirin 325 MG tablet      traMADol 50 MG tablet   Commonly known as: ULTRAM   Take 1 tablet (50 mg total) by mouth every 6 (six) hours as needed.      CONTINUE taking these medications         atorvastatin 80 MG tablet   Commonly known as: LIPITOR      buPROPion 150 MG 12 hr tablet   Commonly known as: WELLBUTRIN SR      furosemide 20 MG tablet   Commonly known as: LASIX      metoprolol succinate 25 MG 24 hr tablet   Commonly known as: TOPROL-XL      nitroGLYCERIN 0.4 MG SL tablet   Commonly known as: NITROSTAT      omeprazole 20 MG capsule   Commonly known as: PRILOSEC      prasugrel 10 MG Tabs   Commonly known as: EFFIENT      ramipril 10 MG capsule   Commonly known as: ALTACE      spironolactone 25 MG tablet   Commonly known as: ALDACTONE      TRILIPIX 135 MG capsule   Generic drug: Choline Fenofibrate      STOP taking these medications         aspirin 325 MG tablet   Replaced by: aspirin 81 MG EC tablet          Where to get your medications    These are the prescriptions that you need to pick up. We sent them to a specific pharmacy, so you will need to go there to get them.   [facility] -  [place], [place] - [address], [address]    [address], [place] [address]    Phone: [phone]        aspirin 81 MG EC tablet   traMADol 50 MG tablet           Outstanding Labs/Studies: None  Duration of Discharge Encounter: Greater than 30 minutes including physician time.  Signed, R. Hurman Horn, PA-C 12/14/2011, 2:04 PM

## 2011-12-14 NOTE — Progress Notes (Signed)
  Echocardiogram 2D Echocardiogram with Definity has been performed.  Jonathan Fischer 12/14/2011, 10:10 AM

## 2011-12-15 ENCOUNTER — Other Ambulatory Visit: Payer: Self-pay

## 2011-12-15 ENCOUNTER — Telehealth: Payer: Self-pay | Admitting: Cardiology

## 2011-12-15 ENCOUNTER — Other Ambulatory Visit: Payer: Self-pay | Admitting: *Deleted

## 2011-12-15 MED ORDER — NITROGLYCERIN 0.4 MG SL SUBL: 0.4000 mg | SUBLINGUAL_TABLET | SUBLINGUAL | Status: DC | PRN

## 2011-12-15 MED ORDER — CHOLINE FENOFIBRATE 135 MG PO CPDR: 135.0000 mg | DELAYED_RELEASE_CAPSULE | Freq: Every evening | ORAL | Status: DC

## 2011-12-15 MED ORDER — RAMIPRIL 10 MG PO CAPS: 10.0000 mg | ORAL_CAPSULE | Freq: Every evening | ORAL | Status: DC

## 2011-12-15 MED ORDER — ATORVASTATIN CALCIUM 80 MG PO TABS: 80.0000 mg | ORAL_TABLET | Freq: Every day | ORAL | Status: DC

## 2011-12-15 MED ORDER — METOPROLOL SUCCINATE ER 25 MG PO TB24: 25.0000 mg | ORAL_TABLET | Freq: Every day | ORAL | Status: DC

## 2011-12-15 MED ORDER — OMEPRAZOLE 20 MG PO CPDR: 20.0000 mg | DELAYED_RELEASE_CAPSULE | Freq: Every day | ORAL | Status: DC

## 2011-12-15 NOTE — Telephone Encounter (Signed)
New problem:   [facility] # [phone].

## 2011-12-16 NOTE — Telephone Encounter (Signed)
RX sent into pharmacy. LMOM for pt. 

## 2011-12-17 ENCOUNTER — Ambulatory Visit (INDEPENDENT_AMBULATORY_CARE_PROVIDER_SITE_OTHER): Payer: Medicare Other | Admitting: Nurse Practitioner

## 2011-12-17 ENCOUNTER — Encounter: Payer: Medicaid Other | Admitting: Physician Assistant

## 2011-12-17 ENCOUNTER — Encounter: Payer: Self-pay | Admitting: Nurse Practitioner

## 2011-12-17 VITALS — BP 110/68 | HR 60 | Ht 73.0 in | Wt 264.1 lb

## 2011-12-17 DIAGNOSIS — I213 ST elevation (STEMI) myocardial infarction of unspecified site: Secondary | ICD-10-CM

## 2011-12-17 DIAGNOSIS — I219 Acute myocardial infarction, unspecified: Secondary | ICD-10-CM

## 2011-12-17 MED ORDER — SPIRONOLACTONE 25 MG PO TABS: 25.0000 mg | ORAL_TABLET | Freq: Every evening | ORAL | Status: DC

## 2011-12-17 MED ORDER — ALPRAZOLAM 0.25 MG PO TABS: 0.2500 mg | ORAL_TABLET | Freq: Three times a day (TID) | ORAL | Status: DC | PRN

## 2011-12-17 NOTE — Progress Notes (Signed)
Jonathan Fischer Date of Birth: 12-15-1970 Medical Record #[id number]  History of Present Illness: Jonathan Fischer is seen back today for a post hospital visit. He is seen for Dr. Swaziland. He is only 41 years of age and has extensive CAD. Has an ischemic CM with EF of 25 to 30%. Has an ICD in place. Has had multiple MIs and multiple PCIs. Continues to smoke.   Most recently had STEMI with occluded proximal LAD treated with repeat DES of the proximal LAD (occluded at the site of the prior stent). Stent in the LCX was patent. Stent in the mid RCA was patent. EF 25 to 30%. Troponin over 20. Echo shoed EF of 25 to 30% as well, septal apical and anterior wall AK and possible small layer of mural apical thrombus without mobility. Not felt to be a candidate for further anticoagulation due to his noncompliance.   He comes in today. He is here with his wife. He is doing ok. Feels pretty weak. No more chest pain. Not short of breath. Continues to smoke but says he is ready to stop. Has not been taking Wellbutrin for several months. Did not tolerate Chantix in the past due to dizziness. Has had no luck with OTC aids. He knows that he is going to have to do this "cold [place]". He also notes that he messed up and has been out of his Aldactone. Needs to get reordered.   Current Outpatient Prescriptions on File Prior to Visit  Medication Sig Dispense Refill  . aspirin EC 81 MG EC tablet Take 1 tablet (81 mg total) by mouth daily.  30 tablet  3  . atorvastatin (LIPITOR) 80 MG tablet Take 1 tablet (80 mg total) by mouth daily.  30 tablet  6  . Choline Fenofibrate (TRILIPIX) 135 MG capsule Take 1 capsule (135 mg total) by mouth every evening.  30 capsule  5  . furosemide (LASIX) 20 MG tablet Take 20 mg by mouth daily as needed. For excess fluid      . metoprolol succinate (TOPROL-XL) 25 MG 24 hr tablet Take 1 tablet (25 mg total) by mouth daily.  30 tablet  5  . nitroGLYCERIN (NITROSTAT) 0.4 MG SL tablet Place 1 tablet (0.4  mg total) under the tongue every 5 (five) minutes as needed. For chest pain  25 tablet  3  . omeprazole (PRILOSEC) 20 MG capsule Take 1 capsule (20 mg total) by mouth daily.  30 capsule  5  . prasugrel (EFFIENT) 10 MG TABS Take 10 mg by mouth daily.      . ramipril (ALTACE) 10 MG capsule Take 1 capsule (10 mg total) by mouth every evening.  30 capsule  5  . traMADol (ULTRAM) 50 MG tablet Take 1 tablet (50 mg total) by mouth every 6 (six) hours as needed.  30 tablet  0    No Known Allergies  Past Medical History  Diagnosis Date  . MI (myocardial infarction) 10/2005, 03/2009, 2013    ANTERIOR  . MI, acute, non ST segment elevation 04/2008    WITH INTERVENTION TO THE RIGHT CORONARY  . Obesity   . Hyperlipidemia   . Coronary artery disease   . ICD (implantable cardiac defibrillator) in place   . Tobacco abuse   . Chronic systolic CHF (congestive heart failure)   . Ischemic cardiomyopathy   . Tobacco abuse     Past Surgical History  Procedure Date  . Cardiac catheterization 03/31/2009  . Cardiac catheterization 05/08/2008  CARDIAC SIZE AND SILHOUETTE NORMAL. THERE WAS ANTERIOR APICAL HYPOKINESIS WITH EF 35-40%  . Coronary angioplasty     BALLOON ANGIOPLASTY TO THE LAD WITH THROMBUS EXTRACTION OF THE PREVIOUSLY STENTED SEGMENT AND NEW STENT PLACEMENT TO THE LEFT CIRCUMFLEX. EF IS DOWN IN THE 30% RANGE  . Cardiac defibrillator placement 11/2009  . Coronary angioplasty with stent placement 12/12/2011    30% mid LCx, mid RCA & mid LCx stents patent. LVEF 25-30%, mid-distal anterolateral wall AK, apex dilated, dyskinetic s/p DES-prox LAD stenosis.    History  Smoking status  . Current Every Day Smoker -- 1.5 packs/day for 24 years  . Types: Cigarettes  Smokeless tobacco  . Never Used    History  Alcohol Use No    Family History  Problem Relation Age of Onset  . Cancer Mother   . Heart disease Father     Review of Systems: The review of systems is per the HPI.  All other  systems were reviewed and are negative.  Physical Exam: BP 110/68  Pulse 60  Ht 6\' 1"  (1.854 m)  Wt 264 lb 1.9 oz (119.804 kg)  BMI 34.85 kg/m2 Patient is in no acute distress. He is obese. Smells of tobacco. Skin is warm and dry. Color is normal.  HEENT is unremarkable. Normocephalic/atraumatic. PERRL. Sclera are nonicteric. Neck is supple. No masses. No JVD. Lungs are clear. Cardiac exam shows a regular rate and rhythm. Abdomen is soft. Extremities are without edema. Right arm looks good. Gait and ROM are intact. No gross neurologic deficits noted.   LABORATORY DATA:  Lab Results  Component Value Date   WBC 10.4 12/13/2011   HGB 14.5 12/13/2011   HCT 42.3 12/13/2011   PLT 215 12/13/2011   GLUCOSE 111* 12/13/2011   CHOL 196 12/13/2011   TRIG 264* 12/13/2011   HDL 28* 12/13/2011   LDLCALC 115* 12/13/2011   ALT 46 12/13/2011   AST 104* 12/13/2011   NA 139 12/13/2011   K 4.0 12/13/2011   CL 99 12/13/2011   CREATININE 0.90 12/13/2011   BUN 9 12/13/2011   CO2 29 12/13/2011   TSH 0.633 12/12/2011   INR 1.08 12/12/2011   HGBA1C 5.6 12/12/2011   Lab Results  Component Value Date   CKTOTAL 161 03/31/2009   CKMB 3.7 03/31/2009   TROPONINI >20.00* 12/12/2011    Echo Study Conclusions  - Left ventricle: Septal apical and anterior wall akinesis The cavity size was moderately dilated. Systolic function was severely reduced. The estimated ejection fraction was in the range of 25% to 30%. - Aortic valve: Trivial regurgitation. - Left atrium: The atrium was mildly dilated. - Atrial septum: No defect or patent foramen ovale was identified. - Impressions: Apical images suggested possible apical thrombus. Definity given and no obvious thrombus seen. Cannot R/O a small layer of mural thrombus but certainly no mobile thrombus.   Cardiac Cath Final Conclusions:   1. Single vessel occlusive coronary disease involving the proximal LAD.  2. Continued excellent patency of the stents in the mid left  circumflex and mid RCA.  3. Severe left ventricular dysfunction.  4. Successful reperfusion of the LAD and repeat stenting of the proximal LAD with a drug-eluting stent.   Recommendations:  Continue dual antiplatelet therapy indefinitely.   Peter [facility]  12/12/2011, 9:00 AM    Assessment / Plan:  1. CAD with recent STEMI/repeat DES to the LAD. No recurrent chest pain. He has associated fatigue.   2. Ischemic CM - EF is  25 to 30% - has ICD in place. No discharges reported.   3. HLD  4. Ongoing tobacco abuse - he says he is ready to quit and knows that he has to quit. He currently has no cigarettes. He would like just a small supply of Xanax to help. I have given him Xanax 0.25 mg to take TID prn with #45 only and NO refills.   I will see him back in a month. His prognosis is quite poor especially if he chooses to not stop smoking.   Patient is agreeable to this plan and will call if any problems develop in the interim.

## 2011-12-17 NOTE — Patient Instructions (Addendum)
I have refilled the Aldactone   I will let you have some Xanax 0.25mg  to take 3 times a day as needed  Today is your day to stop smoking  I will see you in about 1 month  Call the [facility] office at [phone] if you have any questions, problems or concerns.

## 2011-12-21 ENCOUNTER — Encounter: Payer: Self-pay | Admitting: *Deleted

## 2011-12-22 ENCOUNTER — Telehealth: Payer: Self-pay | Admitting: *Deleted

## 2011-12-22 NOTE — Telephone Encounter (Signed)
Pt wants Bupropion 150mg  send to [facility] pharmacy.

## 2011-12-23 NOTE — Telephone Encounter (Signed)
Spoke to patient he stated he did not want a prescription for Bupropion.

## 2012-01-03 ENCOUNTER — Telehealth: Payer: Self-pay | Admitting: Internal Medicine

## 2012-01-03 NOTE — Telephone Encounter (Signed)
01-03-12 PT TO SEND IN REMOTE THIS WEEK, MISSED APPT 12-13-11/MT

## 2012-01-17 ENCOUNTER — Ambulatory Visit: Payer: Medicare Other | Admitting: Nurse Practitioner

## 2012-01-26 ENCOUNTER — Encounter: Payer: Self-pay | Admitting: Nurse Practitioner

## 2012-02-04 ENCOUNTER — Encounter: Payer: Self-pay | Admitting: *Deleted

## 2012-03-15 ENCOUNTER — Encounter: Payer: Self-pay | Admitting: Internal Medicine

## 2012-03-15 ENCOUNTER — Ambulatory Visit (INDEPENDENT_AMBULATORY_CARE_PROVIDER_SITE_OTHER): Payer: Medicare Other | Admitting: *Deleted

## 2012-03-15 ENCOUNTER — Other Ambulatory Visit: Payer: Self-pay

## 2012-03-15 DIAGNOSIS — Z95810 Presence of automatic (implantable) cardiac defibrillator: Secondary | ICD-10-CM

## 2012-03-15 DIAGNOSIS — I255 Ischemic cardiomyopathy: Secondary | ICD-10-CM

## 2012-03-15 DIAGNOSIS — I2589 Other forms of chronic ischemic heart disease: Secondary | ICD-10-CM

## 2012-03-27 LAB — REMOTE ICD DEVICE
BATTERY VOLTAGE: 3.1611 V
BRDY-0002RV: 40 {beats}/min
CHARGE TIME: 9.389 s
DEV-0020ICD: NEGATIVE
FVT: 0
PACEART VT: 0
RV LEAD AMPLITUDE: 9.6 mv
RV LEAD IMPEDENCE ICD: 532 Ohm
TOT-0001: 1
TOT-0002: 0
TOT-0006: 20111102000000
TZAT-0001FASTVT: 1
TZAT-0001SLOWVT: 1
TZAT-0002FASTVT: NEGATIVE
TZAT-0002SLOWVT: NEGATIVE
TZAT-0012FASTVT: 200 ms
TZAT-0012SLOWVT: 200 ms
TZAT-0018FASTVT: NEGATIVE
TZAT-0018SLOWVT: NEGATIVE
TZAT-0019FASTVT: 8 V
TZAT-0019SLOWVT: 8 V
TZAT-0020FASTVT: 1.5 ms
TZAT-0020SLOWVT: 1.5 ms
TZON-0003SLOWVT: 360 ms
TZON-0003VSLOWVT: 360 ms
TZON-0004SLOWVT: 16
TZON-0004VSLOWVT: 32
TZON-0005SLOWVT: 12
TZST-0001FASTVT: 2
TZST-0001FASTVT: 3
TZST-0001FASTVT: 4
TZST-0001FASTVT: 5
TZST-0001FASTVT: 6
TZST-0001SLOWVT: 2
TZST-0001SLOWVT: 3
TZST-0001SLOWVT: 4
TZST-0001SLOWVT: 5
TZST-0001SLOWVT: 6
TZST-0002FASTVT: NEGATIVE
TZST-0002FASTVT: NEGATIVE
TZST-0002FASTVT: NEGATIVE
TZST-0002FASTVT: NEGATIVE
TZST-0002FASTVT: NEGATIVE
TZST-0002SLOWVT: NEGATIVE
TZST-0002SLOWVT: NEGATIVE
TZST-0002SLOWVT: NEGATIVE
TZST-0002SLOWVT: NEGATIVE
TZST-0002SLOWVT: NEGATIVE
VENTRICULAR PACING ICD: 0.02 pct
VF: 0

## 2012-04-13 ENCOUNTER — Encounter: Payer: Self-pay | Admitting: *Deleted

## 2012-04-27 ENCOUNTER — Encounter: Payer: Self-pay | Admitting: Nurse Practitioner

## 2012-05-16 ENCOUNTER — Encounter: Payer: Self-pay | Admitting: Internal Medicine

## 2012-07-06 ENCOUNTER — Telehealth: Payer: Self-pay | Admitting: Cardiology

## 2012-07-06 NOTE — Telephone Encounter (Signed)
Returned call to patient spoke to father effient 10 mg samples left at 3rd floor front desk.

## 2012-07-06 NOTE — Telephone Encounter (Signed)
New Problem  Pt is wanting some samples EFFIENT.

## 2012-07-31 ENCOUNTER — Emergency Department: Payer: Self-pay | Admitting: Emergency Medicine

## 2012-07-31 LAB — CBC WITH DIFFERENTIAL/PLATELET
Basophil %: 0.7 %
Eosinophil #: 0.1 10*3/uL (ref 0.0–0.7)
Eosinophil %: 0.8 %
HCT: 42.8 % (ref 40.0–52.0)
HGB: 14.9 g/dL (ref 13.0–18.0)
Lymphocyte %: 19.3 %
Monocyte #: 0.9 x10 3/mm (ref 0.2–1.0)
Monocyte %: 7.2 %
Neutrophil #: 8.6 10*3/uL — ABNORMAL HIGH (ref 1.4–6.5)
Neutrophil %: 72 %
RBC: 4.62 10*6/uL (ref 4.40–5.90)
WBC: 12 10*3/uL — ABNORMAL HIGH (ref 3.8–10.6)

## 2012-07-31 LAB — BASIC METABOLIC PANEL
Anion Gap: 4 — ABNORMAL LOW (ref 7–16)
BUN: 12 mg/dL (ref 7–18)
Calcium, Total: 8.9 mg/dL (ref 8.5–10.1)
Co2: 28 mmol/L (ref 21–32)
Creatinine: 1.02 mg/dL (ref 0.60–1.30)
EGFR (African American): >60
EGFR (Non-African Amer.): >60
Osmolality: 272 (ref 275–301)
Sodium: 136 mmol/L (ref 136–145)

## 2012-08-02 ENCOUNTER — Emergency Department: Payer: Self-pay | Admitting: Emergency Medicine

## 2012-08-05 ENCOUNTER — Emergency Department: Payer: Self-pay | Admitting: Emergency Medicine

## 2012-08-08 ENCOUNTER — Encounter: Payer: Self-pay | Admitting: *Deleted

## 2012-10-04 ENCOUNTER — Encounter (HOSPITAL_COMMUNITY): Payer: Self-pay

## 2012-10-04 ENCOUNTER — Emergency Department (HOSPITAL_COMMUNITY)
Admission: EM | Admit: 2012-10-04 | Discharge: 2012-10-05 | Disposition: A | Discharge status: Home / Self Care | Payer: Medicare Other | Attending: Emergency Medicine | Admitting: Emergency Medicine

## 2012-10-04 DIAGNOSIS — E785 Hyperlipidemia, unspecified: Secondary | ICD-10-CM | POA: Insufficient documentation

## 2012-10-04 DIAGNOSIS — Z7982 Long term (current) use of aspirin: Secondary | ICD-10-CM | POA: Insufficient documentation

## 2012-10-04 DIAGNOSIS — I252 Old myocardial infarction: Secondary | ICD-10-CM | POA: Insufficient documentation

## 2012-10-04 DIAGNOSIS — E669 Obesity, unspecified: Secondary | ICD-10-CM | POA: Insufficient documentation

## 2012-10-04 DIAGNOSIS — Z79899 Other long term (current) drug therapy: Secondary | ICD-10-CM | POA: Insufficient documentation

## 2012-10-04 DIAGNOSIS — Z9861 Coronary angioplasty status: Secondary | ICD-10-CM | POA: Insufficient documentation

## 2012-10-04 DIAGNOSIS — K029 Dental caries, unspecified: Secondary | ICD-10-CM | POA: Insufficient documentation

## 2012-10-04 DIAGNOSIS — I2510 Atherosclerotic heart disease of native coronary artery without angina pectoris: Secondary | ICD-10-CM | POA: Insufficient documentation

## 2012-10-04 DIAGNOSIS — Z95810 Presence of automatic (implantable) cardiac defibrillator: Secondary | ICD-10-CM | POA: Insufficient documentation

## 2012-10-04 DIAGNOSIS — F17200 Nicotine dependence, unspecified, uncomplicated: Secondary | ICD-10-CM | POA: Insufficient documentation

## 2012-10-04 DIAGNOSIS — I5022 Chronic systolic (congestive) heart failure: Secondary | ICD-10-CM | POA: Insufficient documentation

## 2012-10-04 NOTE — ED Provider Notes (Signed)
CSN: [id number]     Arrival date & time 10/04/12  2255 History   First MD Initiated Contact with Patient 10/04/12 2324     Chief Complaint  Patient presents with  . Dental Pain   (Consider location/radiation/quality/duration/timing/severity/associated sxs/prior Treatment) The history is provided by the patient and medical records. No language interpreter was used.    Jonathan Fischer is a 42 y.o. male  with a hx of MI, HLD, ICD, Obesity, CAD presents to the Emergency Department complaining of gradual, persistent, progressively worsening pain to the right lower posterior molar onset 8:30PM. Pt had a crown in that tooth that came out some time ago and he has had intermittent pain since that time.  Pt reports intermittent pain in that tooth for several months but it is not going away tonight.  Pt did not attempt to take any OTC meds for the pain.  Nothing makes it better and eating and drinking makes it worse.  Pt denies fever, chills, headache, neck pain, chest pain, SOB, abd pain, N/V/D.      Past Medical History  Diagnosis Date  . MI (myocardial infarction) 10/2005, 03/2009, 2013    ANTERIOR  . MI, acute, non ST segment elevation 04/2008    WITH INTERVENTION TO THE RIGHT CORONARY  . Obesity   . Hyperlipidemia   . Coronary artery disease   . ICD (implantable cardiac defibrillator) in place   . Tobacco abuse   . Chronic systolic CHF (congestive heart failure)   . Ischemic cardiomyopathy   . Tobacco abuse    Past Surgical History  Procedure Laterality Date  . Cardiac catheterization  03/31/2009  . Cardiac catheterization  05/08/2008    CARDIAC SIZE AND SILHOUETTE NORMAL. THERE WAS ANTERIOR APICAL HYPOKINESIS WITH EF 35-40%  . Coronary angioplasty      BALLOON ANGIOPLASTY TO THE LAD WITH THROMBUS EXTRACTION OF THE PREVIOUSLY STENTED SEGMENT AND NEW STENT PLACEMENT TO THE LEFT CIRCUMFLEX. EF IS DOWN IN THE 30% RANGE  . Cardiac defibrillator placement  11/2009  . Coronary angioplasty with  stent placement  12/12/2011    30% mid LCx, mid RCA & mid LCx stents patent. LVEF 25-30%, mid-distal anterolateral wall AK, apex dilated, dyskinetic s/p DES-prox LAD stenosis.   Family History  Problem Relation Age of Onset  . Cancer Mother   . Heart disease Father    History  Substance Use Topics  . Smoking status: Current Every Day Smoker -- 1.50 packs/day for 24 years    Types: Cigarettes  . Smokeless tobacco: Never Used  . Alcohol Use: No    Review of Systems  Constitutional: Negative for fever, chills and appetite change.  HENT: Positive for dental problem. Negative for ear pain, nosebleeds, facial swelling, rhinorrhea, drooling, trouble swallowing, neck pain, neck stiffness and postnasal drip.   Eyes: Negative for pain and redness.  Respiratory: Negative for cough and wheezing.   Cardiovascular: Negative for chest pain.  Gastrointestinal: Negative for nausea, vomiting and abdominal pain.  Skin: Negative for color change and rash.  Neurological: Negative for weakness, light-headedness and headaches.  All other systems reviewed and are negative.    Allergies  Review of patient's allergies indicates no known allergies.  Home Medications   Current Outpatient Rx  Name  Route  Sig  Dispense  Refill  . aspirin EC 81 MG EC tablet   Oral   Take 1 tablet (81 mg total) by mouth daily.   30 tablet   3   . atorvastatin (LIPITOR)  80 MG tablet   Oral   Take 1 tablet (80 mg total) by mouth daily.   30 tablet   6   . Choline Fenofibrate (TRILIPIX) 135 MG capsule   Oral   Take 1 capsule (135 mg total) by mouth every evening.   30 capsule   5   . metoprolol succinate (TOPROL-XL) 25 MG 24 hr tablet   Oral   Take 1 tablet (25 mg total) by mouth daily.   30 tablet   5   . omeprazole (PRILOSEC) 20 MG capsule   Oral   Take 1 capsule (20 mg total) by mouth daily.   30 capsule   5   . oxymetazoline (AFRIN) 0.05 % nasal spray   Nasal   Place 2 sprays into the nose 2  (two) times daily as needed for congestion.         . prasugrel (EFFIENT) 10 MG TABS   Oral   Take 10 mg by mouth daily.         . ramipril (ALTACE) 10 MG capsule   Oral   Take 1 capsule (10 mg total) by mouth every evening.   30 capsule   5   . spironolactone (ALDACTONE) 25 MG tablet   Oral   Take 1 tablet (25 mg total) by mouth every evening.   30 tablet   6   . nitroGLYCERIN (NITROSTAT) 0.4 MG SL tablet   Sublingual   Place 1 tablet (0.4 mg total) under the tongue every 5 (five) minutes as needed. For chest pain   25 tablet   3   . oxyCODONE-acetaminophen (PERCOCET/ROXICET) 5-325 MG per tablet   Oral   Take 1-2 tablets by mouth every 4 (four) hours as needed for pain.   15 tablet   0   . penicillin v potassium (VEETID) 500 MG tablet   Oral   Take 1 tablet (500 mg total) by mouth 4 (four) times daily.   40 tablet   0    BP 133/76  Pulse 58  Temp(Src) 98 F (36.7 C) (Oral)  Resp 16  SpO2 97% Physical Exam  Nursing note and vitals reviewed. Constitutional: He is oriented to person, place, and time. He appears well-developed and well-nourished.  HENT:  Head: Normocephalic and atraumatic.  Right Ear: Tympanic membrane, external ear and ear canal normal.  Left Ear: Tympanic membrane, external ear and ear canal normal.  Nose: Nose normal. No mucosal edema or rhinorrhea. Right sinus exhibits no maxillary sinus tenderness and no frontal sinus tenderness. Left sinus exhibits no maxillary sinus tenderness and no frontal sinus tenderness.  Mouth/Throat: Uvula is midline, oropharynx is clear and moist and mucous membranes are normal. No oral lesions. Abnormal dentition. Dental caries present. No dental abscesses, edematous or lacerations. No oropharyngeal exudate, posterior oropharyngeal edema, posterior oropharyngeal erythema or tonsillar abscesses.    Dental caries throughout No facial swelling or erythema Tooth #32 is broken No erythema or swelling of the gums,  no gross abscess palpable  Eyes: Conjunctivae are normal. Pupils are equal, round, and reactive to light. Right eye exhibits no discharge. Left eye exhibits no discharge.  Neck: Normal range of motion. Neck supple.  Cardiovascular: Normal rate, regular rhythm, normal heart sounds and intact distal pulses.   No murmur heard. Pulmonary/Chest: Effort normal and breath sounds normal. No respiratory distress. He has no wheezes.  Abdominal: Soft. Bowel sounds are normal. He exhibits no distension. There is no tenderness.  Musculoskeletal: Normal range of motion.  He exhibits no tenderness.  Lymphadenopathy:    He has no cervical adenopathy.  Neurological: He is alert and oriented to person, place, and time. He exhibits normal muscle tone. Coordination normal.  Skin: Skin is warm and dry. No rash noted. No erythema.  Psychiatric: He has a normal mood and affect.    ED Course  Dental Date/Time: 10/05/2012 12:18 AM Performed by: Dierdre Forth Authorized by: Dierdre Forth Consent: Verbal consent obtained. Risks and benefits: risks, benefits and alternatives were discussed Patient understanding: patient states understanding of the procedure being performed Patient consent: the patient's understanding of the procedure matches consent given Procedure consent: procedure consent matches procedure scheduled Relevant documents: relevant documents present and verified Site marked: the operative site was marked Required items: required blood products, implants, devices, and special equipment available Patient identity confirmed: verbally with patient and arm band Time out: Immediately prior to procedure a "time out" was called to verify the correct patient, procedure, equipment, support staff and site/side marked as required. Preparation: Patient was prepped and draped in the usual sterile fashion. Local anesthesia used: yes Local anesthetic: bupivacaine 0.5% with epinephrine Anesthetic  total: 1 ml Patient sedated: no Patient tolerance: Patient tolerated the procedure well with no immediate complications. Comments: Anesthesia of tooth # 32 without complication   (including critical care time) Labs Review Labs Reviewed - No data to display Imaging Review No results found.  MDM   1. Pain due to dental caries      Jonathan Fischer presents with dental pain after eating dinner.  Patient with toothache secondary to dental caries.  No gross abscess.  Exam unconcerning for Ludwig's angina or spread of infection.  Will treat with penicillin and pain medicine.  Pt given dental block here in the department with adequate relief of pain and without complication. Urged patient to follow-up with dentist.    It has been determined that no acute conditions requiring further emergency intervention are present at this time. The patient/guardian have been advised of the diagnosis and plan. We have discussed signs and symptoms that warrant return to the ED, such as changes or worsening in symptoms.   Vital signs are stable at discharge.   BP 133/76  Pulse 58  Temp(Src) 98 F (36.7 C) (Oral)  Resp 16  SpO2 97%  Patient/guardian has voiced understanding and agreed to follow-up with the PCP or specialist.         Dierdre Forth, PA-C 10/05/12 0018  Dierdre Forth, PA-C 10/05/12 [id number]

## 2012-10-04 NOTE — ED Notes (Signed)
Pt c/o Right lower tooth pain, pt reports he has several teeth that have exposed nerves and has several dental cavities.

## 2012-10-05 MED ORDER — PENICILLIN V POTASSIUM 500 MG PO TABS: 500.0000 mg | ORAL_TABLET | Freq: Four times a day (QID) | ORAL | Status: DC

## 2012-10-05 MED ORDER — HYDROCODONE-ACETAMINOPHEN 5-325 MG PO TABS
2.0000 | ORAL_TABLET | Freq: Once | ORAL | Status: AC
  Administered 2012-10-05: 2 via ORAL
  Filled 2012-10-05: qty 2

## 2012-10-05 MED ORDER — OXYCODONE-ACETAMINOPHEN 5-325 MG PO TABS: 1.0000 | ORAL_TABLET | ORAL | Status: DC | PRN

## 2012-10-05 NOTE — ED Provider Notes (Signed)
Medical screening examination/treatment/procedure(s) were performed by non-physician practitioner and as supervising physician I was immediately available for consultation/collaboration.   Celene Kras, MD 10/05/12 [phone]

## 2012-10-18 ENCOUNTER — Other Ambulatory Visit: Payer: Self-pay

## 2012-10-18 ENCOUNTER — Emergency Department (HOSPITAL_COMMUNITY): Payer: Medicare Other

## 2012-10-18 ENCOUNTER — Observation Stay (HOSPITAL_COMMUNITY)
Admission: EM | Admit: 2012-10-18 | Discharge: 2012-10-18 | Disposition: A | Discharge status: Home / Self Care | Payer: Medicare Other | Attending: Cardiology | Admitting: Cardiology

## 2012-10-18 ENCOUNTER — Encounter (HOSPITAL_COMMUNITY): Payer: Self-pay | Admitting: Emergency Medicine

## 2012-10-18 ENCOUNTER — Other Ambulatory Visit: Payer: Self-pay | Admitting: Physician Assistant

## 2012-10-18 DIAGNOSIS — I472 Ventricular tachycardia, unspecified: Principal | ICD-10-CM | POA: Diagnosis present

## 2012-10-18 DIAGNOSIS — F17200 Nicotine dependence, unspecified, uncomplicated: Secondary | ICD-10-CM | POA: Diagnosis present

## 2012-10-18 DIAGNOSIS — E785 Hyperlipidemia, unspecified: Secondary | ICD-10-CM | POA: Diagnosis present

## 2012-10-18 DIAGNOSIS — I10 Essential (primary) hypertension: Secondary | ICD-10-CM | POA: Insufficient documentation

## 2012-10-18 DIAGNOSIS — R079 Chest pain, unspecified: Secondary | ICD-10-CM

## 2012-10-18 DIAGNOSIS — I2589 Other forms of chronic ischemic heart disease: Secondary | ICD-10-CM

## 2012-10-18 DIAGNOSIS — Z720 Tobacco use: Secondary | ICD-10-CM

## 2012-10-18 DIAGNOSIS — Z9119 Patient's noncompliance with other medical treatment and regimen: Secondary | ICD-10-CM | POA: Insufficient documentation

## 2012-10-18 DIAGNOSIS — R002 Palpitations: Secondary | ICD-10-CM

## 2012-10-18 DIAGNOSIS — I4729 Other ventricular tachycardia: Principal | ICD-10-CM | POA: Insufficient documentation

## 2012-10-18 DIAGNOSIS — I255 Ischemic cardiomyopathy: Secondary | ICD-10-CM

## 2012-10-18 DIAGNOSIS — Z79899 Other long term (current) drug therapy: Secondary | ICD-10-CM | POA: Insufficient documentation

## 2012-10-18 DIAGNOSIS — E669 Obesity, unspecified: Secondary | ICD-10-CM | POA: Insufficient documentation

## 2012-10-18 DIAGNOSIS — R0789 Other chest pain: Secondary | ICD-10-CM | POA: Insufficient documentation

## 2012-10-18 DIAGNOSIS — Z6832 Body mass index (BMI) 32.0-32.9, adult: Secondary | ICD-10-CM | POA: Insufficient documentation

## 2012-10-18 DIAGNOSIS — I252 Old myocardial infarction: Secondary | ICD-10-CM | POA: Insufficient documentation

## 2012-10-18 DIAGNOSIS — I2510 Atherosclerotic heart disease of native coronary artery without angina pectoris: Secondary | ICD-10-CM

## 2012-10-18 DIAGNOSIS — I25110 Atherosclerotic heart disease of native coronary artery with unstable angina pectoris: Secondary | ICD-10-CM | POA: Diagnosis present

## 2012-10-18 DIAGNOSIS — Z7902 Long term (current) use of antithrombotics/antiplatelets: Secondary | ICD-10-CM | POA: Insufficient documentation

## 2012-10-18 DIAGNOSIS — Z91199 Patient's noncompliance with other medical treatment and regimen due to unspecified reason: Secondary | ICD-10-CM | POA: Insufficient documentation

## 2012-10-18 DIAGNOSIS — I509 Heart failure, unspecified: Secondary | ICD-10-CM | POA: Insufficient documentation

## 2012-10-18 DIAGNOSIS — I5022 Chronic systolic (congestive) heart failure: Secondary | ICD-10-CM | POA: Diagnosis present

## 2012-10-18 DIAGNOSIS — Z9861 Coronary angioplasty status: Secondary | ICD-10-CM | POA: Insufficient documentation

## 2012-10-18 DIAGNOSIS — Z4502 Encounter for adjustment and management of automatic implantable cardiac defibrillator: Secondary | ICD-10-CM

## 2012-10-18 DIAGNOSIS — Z95810 Presence of automatic (implantable) cardiac defibrillator: Secondary | ICD-10-CM | POA: Diagnosis present

## 2012-10-18 HISTORY — DX: Ventricular tachycardia, unspecified: I47.20

## 2012-10-18 HISTORY — DX: Abnormal findings on diagnostic imaging of heart and coronary circulation: R93.1

## 2012-10-18 HISTORY — DX: Atherosclerotic heart disease of native coronary artery without angina pectoris: I25.10

## 2012-10-18 HISTORY — DX: Ventricular tachycardia: I47.2

## 2012-10-18 HISTORY — DX: Essential (primary) hypertension: I10

## 2012-10-18 LAB — CBC WITH DIFFERENTIAL/PLATELET
Eosinophils Absolute: 0.2 10*3/uL (ref 0.0–0.7)
Hemoglobin: 15.3 g/dL (ref 13.0–17.0)
Lymphocytes Relative: 41 % (ref 12–46)
Lymphs Abs: 3.4 10*3/uL (ref 0.7–4.0)
Monocytes Relative: 6 % (ref 3–12)
Neutro Abs: 4.2 10*3/uL (ref 1.7–7.7)
Neutrophils Relative %: 51 % (ref 43–77)
Platelets: 214 10*3/uL (ref 150–400)
RBC: 4.61 MIL/uL (ref 4.22–5.81)
WBC: 8.3 10*3/uL (ref 4.0–10.5)

## 2012-10-18 LAB — COMPREHENSIVE METABOLIC PANEL
ALT: 41 U/L (ref 0–53)
Alkaline Phosphatase: 62 U/L (ref 39–117)
CO2: 27 mEq/L (ref 19–32)
Chloride: 101 mEq/L (ref 96–112)
GFR calc Af Amer: >90 mL/min (ref >90)
Glucose, Bld: 115 mg/dL — ABNORMAL HIGH (ref 70–99)
Potassium: 3.5 mEq/L (ref 3.5–5.1)
Sodium: 139 mEq/L (ref 135–145)
Total Bilirubin: 0.2 mg/dL — ABNORMAL LOW (ref 0.3–1.2)
Total Protein: 7.1 g/dL (ref 6.0–8.3)

## 2012-10-18 MED ORDER — PANTOPRAZOLE SODIUM 40 MG PO TBEC
40.0000 mg | DELAYED_RELEASE_TABLET | Freq: Every day | ORAL | Status: DC
  Administered 2012-10-18: 40 mg via ORAL
  Filled 2012-10-18: qty 1

## 2012-10-18 MED ORDER — MORPHINE SULFATE 4 MG/ML IJ SOLN
4.0000 mg | Freq: Once | INTRAMUSCULAR | Status: AC
  Administered 2012-10-18: 4 mg via INTRAVENOUS
  Filled 2012-10-18: qty 1

## 2012-10-18 MED ORDER — METOPROLOL SUCCINATE ER 25 MG PO TB24
25.0000 mg | ORAL_TABLET | Freq: Every day | ORAL | Status: DC
  Administered 2012-10-18: 25 mg via ORAL
  Filled 2012-10-18: qty 1

## 2012-10-18 MED ORDER — ONDANSETRON HCL 4 MG/2ML IJ SOLN: 4.0000 mg | Freq: Four times a day (QID) | INTRAMUSCULAR | Status: DC | PRN

## 2012-10-18 MED ORDER — SODIUM CHLORIDE 0.9 % IJ SOLN: 3.0000 mL | Freq: Two times a day (BID) | INTRAMUSCULAR | Status: DC

## 2012-10-18 MED ORDER — NITROGLYCERIN 0.4 MG SL SUBL: 0.4000 mg | SUBLINGUAL_TABLET | SUBLINGUAL | Status: DC | PRN

## 2012-10-18 MED ORDER — SPIRONOLACTONE 25 MG PO TABS
25.0000 mg | ORAL_TABLET | Freq: Every evening | ORAL | Status: DC
  Filled 2012-10-18: qty 1

## 2012-10-18 MED ORDER — INFLUENZA VAC SPLIT QUAD 0.5 ML IM SUSP: 0.5000 mL | INTRAMUSCULAR | Status: DC

## 2012-10-18 MED ORDER — FENOFIBRATE 54 MG PO TABS
54.0000 mg | ORAL_TABLET | Freq: Every day | ORAL | Status: DC
  Administered 2012-10-18: 54 mg via ORAL
  Filled 2012-10-18: qty 1

## 2012-10-18 MED ORDER — ACETAMINOPHEN 325 MG PO TABS: 650.0000 mg | ORAL_TABLET | ORAL | Status: DC | PRN

## 2012-10-18 MED ORDER — SODIUM CHLORIDE 0.9 % IJ SOLN: 3.0000 mL | INTRAMUSCULAR | Status: DC | PRN

## 2012-10-18 MED ORDER — RAMIPRIL 10 MG PO CAPS
10.0000 mg | ORAL_CAPSULE | Freq: Every evening | ORAL | Status: DC
  Filled 2012-10-18: qty 1

## 2012-10-18 MED ORDER — ASPIRIN EC 81 MG PO TBEC
81.0000 mg | DELAYED_RELEASE_TABLET | Freq: Every day | ORAL | Status: DC
  Administered 2012-10-18: 81 mg via ORAL
  Filled 2012-10-18: qty 1

## 2012-10-18 MED ORDER — HEPARIN SODIUM (PORCINE) 5000 UNIT/ML IJ SOLN
5000.0000 [IU] | Freq: Three times a day (TID) | INTRAMUSCULAR | Status: DC
  Administered 2012-10-18: 5000 [IU] via SUBCUTANEOUS
  Filled 2012-10-18 (×4): qty 1

## 2012-10-18 MED ORDER — PRASUGREL HCL 10 MG PO TABS
10.0000 mg | ORAL_TABLET | Freq: Every day | ORAL | Status: DC
  Administered 2012-10-18: 10 mg via ORAL
  Filled 2012-10-18: qty 1

## 2012-10-18 MED ORDER — POTASSIUM CHLORIDE CRYS ER 20 MEQ PO TBCR: 20.0000 meq | EXTENDED_RELEASE_TABLET | Freq: Every day | ORAL | Status: DC

## 2012-10-18 MED ORDER — SODIUM CHLORIDE 0.9 % IV SOLN: 250.0000 mL | INTRAVENOUS | Status: DC | PRN

## 2012-10-18 MED ORDER — ASPIRIN 81 MG PO CHEW
324.0000 mg | CHEWABLE_TABLET | Freq: Once | ORAL | Status: AC
  Administered 2012-10-18: 324 mg via ORAL
  Filled 2012-10-18: qty 4

## 2012-10-18 MED ORDER — ATORVASTATIN CALCIUM 80 MG PO TABS
80.0000 mg | ORAL_TABLET | Freq: Every day | ORAL | Status: DC
  Administered 2012-10-18: 80 mg via ORAL
  Filled 2012-10-18: qty 1

## 2012-10-18 NOTE — Progress Notes (Signed)
States that Dr. Swaziland told his wife that he could go home today and he has been waiting since 1200pm and hasn't beed discharged yet. I discussed with him the serial enzymes haven't been finished as of yet. Has removed hi sheart monitor and is fully dressed sitting on the side of bed ready to leave. I explained to him if he chose to go it would be against medical advice. I currently have a call in to Dr. Swaziland awaiting a response.

## 2012-10-18 NOTE — Discharge Summary (Signed)
Patient seen and examined and history reviewed. Agree with above findings and plan. See my earlier rounding note.   Jonathan Fischer 10/18/2012 4:55 PM

## 2012-10-18 NOTE — H&P (Addendum)
History and Physical   Patient ID: Jonathan Fischer MRN: [id number], DOB/AGE: 09/11/1970   Admit date: 10/18/2012 Date of Consult: 10/18/2012   Primary Physician: No primary provider on file. Primary Cardiologist:  Dr. Peter Swaziland  HPI: Jonathan Fischer is a 42 y.o. male with PMHx of HTN, obesity, smoking and ICMO/HFrEF=30% s/p single-chamber Medtronic ICD with h/o prior anterior STEMI in 2007 requiring PCI+stenting LAD, recurrent LAD occlusion with further PCI+stenting in 2008 at [facility].  He has had his last remote ICD check-in on 03/15/12 which did show 1 monitored VT episode lasting 14 seconds but otherwise patient has never been shocked in the past.  Today he presents to the [facility] ED from home after initially being seen earlier in the night at [facility] for ICD discharge.  He states that he was playing softball earlier in the evening on 10/7 and suddenly had an ICD shock (~19:30 on 10/17/12) with no warning/symptoms which led him to fall to the ground but he insists that he did not have LOC.  He presented to the [facility] ED and they called the on-call Cardiologist here at [facility] who advised that since patient was otherwise asymptomatic with no major lab abnormalities he could go home from the [facility] ED and follow-up with Dr. Elvis Coil office in the AM.  However, upon returning home the patient had episodic lightheadedness and brief periods of palpitations lasting several minutes at a time, on and off while at rest.  He then developed right sided shooting leg pains and right-sided chest pain described as pleuritic and reproducible to palpation and unlike his typical anginal pattern..  Thus he presented to the [facility] ED for further evaluation.  The patient's telemetry in the ED did not show any arrhythmias.  He is otherwise asymptomatic upon arrival to the [facility] ED.  Labwork showed K+=3.5 and Mg2+=2.2.  His Troponin by iSTAT was 0.04.  Given these ongoing symptoms the patient was admitted  to the Cardiology service of Dr. Swaziland.   Problem List: Past Medical History  Diagnosis Date  . MI (myocardial infarction) 10/2005, 03/2009, 2013    ANTERIOR  . MI, acute, non ST segment elevation 04/2008    WITH INTERVENTION TO THE RIGHT CORONARY  . Obesity   . Hyperlipidemia   . Coronary artery disease   . ICD (implantable cardiac defibrillator) in place   . Tobacco abuse   . Chronic systolic CHF (congestive heart failure)   . Ischemic cardiomyopathy   . Tobacco abuse     Past Surgical History  Procedure Laterality Date  . Cardiac catheterization  03/31/2009  . Cardiac catheterization  05/08/2008    CARDIAC SIZE AND SILHOUETTE NORMAL. THERE WAS ANTERIOR APICAL HYPOKINESIS WITH EF 35-40%  . Coronary angioplasty      BALLOON ANGIOPLASTY TO THE LAD WITH THROMBUS EXTRACTION OF THE PREVIOUSLY STENTED SEGMENT AND NEW STENT PLACEMENT TO THE LEFT CIRCUMFLEX. EF IS DOWN IN THE 30% RANGE  . Cardiac defibrillator placement  11/2009  . Coronary angioplasty with stent placement  12/12/2011    30% mid LCx, mid RCA & mid LCx stents patent. LVEF 25-30%, mid-distal anterolateral wall AK, apex dilated, dyskinetic s/p DES-prox LAD stenosis.     Allergies: No Known Allergies  Home Medications: Prior to Admission medications   Medication Sig Start Date End Date Taking? Authorizing Provider  aspirin EC 81 MG EC tablet Take 1 tablet (81 mg total) by mouth daily. 12/14/11  Yes Gery Pray, PA-C  atorvastatin (LIPITOR) 80 MG tablet Take 1 tablet (80 mg total) by mouth daily. 12/15/11  Yes Duke Salvia, MD  Choline Fenofibrate (TRILIPIX) 135 MG capsule Take 1 capsule (135 mg total) by mouth every evening. 12/15/11  Yes Duke Salvia, MD  metoprolol succinate (TOPROL-XL) 25 MG 24 hr tablet Take 1 tablet (25 mg total) by mouth daily. 12/15/11  Yes Duke Salvia, MD  nitroGLYCERIN (NITROSTAT) 0.4 MG SL tablet Place 1 tablet (0.4 mg total) under the tongue every 5 (five) minutes as needed. For chest  pain 12/15/11  Yes Duke Salvia, MD  omeprazole (PRILOSEC) 20 MG capsule Take 1 capsule (20 mg total) by mouth daily. 12/15/11  Yes Duke Salvia, MD  oxyCODONE-acetaminophen (PERCOCET/ROXICET) 5-325 MG per tablet Take 1-2 tablets by mouth every 4 (four) hours as needed for pain. 10/05/12  Yes Hannah Muthersbaugh, PA-C  oxymetazoline (AFRIN) 0.05 % nasal spray Place 2 sprays into the nose 2 (two) times daily as needed for congestion.   Yes Historical Provider, MD  penicillin v potassium (VEETID) 500 MG tablet Take 1 tablet (500 mg total) by mouth 4 (four) times daily. 10/05/12  Yes Hannah Muthersbaugh, PA-C  prasugrel (EFFIENT) 10 MG TABS Take 10 mg by mouth daily.   Yes Historical Provider, MD  ramipril (ALTACE) 10 MG capsule Take 1 capsule (10 mg total) by mouth every evening. 12/15/11  Yes Duke Salvia, MD  spironolactone (ALDACTONE) 25 MG tablet Take 1 tablet (25 mg total) by mouth every evening. 12/17/11  Yes Rosalio Macadamia, NP    Inpatient Medications:  . aspirin  324 mg Oral Once  .  morphine injection  4 mg Intravenous Once    (Not in a hospital admission)  Family History  Problem Relation Age of Onset  . Cancer Mother   . Heart disease Father      History   Social History  . Marital Status: Married    Spouse Name: N/A    Number of Children: N/A  . Years of Education: N/A   Occupational History  . Not on file.   Social History Main Topics  . Smoking status: Current Every Day Smoker -- 1.50 packs/day for 24 years    Types: Cigarettes  . Smokeless tobacco: Never Used  . Alcohol Use: No  . Drug Use: No  . Sexual Activity: Yes   Other Topics Concern  . Not on file   Social History Narrative  . No narrative on file     Review of Systems: All other systems reviewed and are otherwise negative except as noted above.  Physical Exam: Blood pressure 116/65, pulse 53, temperature 98 F (36.7 C), temperature source Oral, resp. rate 21, SpO2 97.00%.  General: Well  developed, well nourished, in no acute distress. Head: Normocephalic, atraumatic, sclera non-icteric, no xanthomas, nares are without discharge.  Neck: Negative for carotid bruits. JVD not elevated. Lungs: Clear bilaterally to auscultation without wheezes, rales, or rhonchi. Breathing is unlabored.  ICD site without tenderness.  Right chest wall mildly tender to palpation. Heart: RRR with S1 S2. No murmurs, rubs, or gallops appreciated. Abdomen: Soft, non-tender, non-distended with normoactive bowel sounds. No hepatomegaly. No rebound/guarding. No obvious abdominal masses. Msk:  Strength and tone appears normal for age. Extremities:  No clubbing, cyanosis or edema.  Distal pedal pulses are 2+ and equal bilaterally. Neuro: Alert and oriented X 3. Moves all extremities spontaneously. Psych:  Responds to questions appropriately with a normal affect.  Labs: Recent  Labs     10/18/12  0230  WBC  8.3  HGB  15.3  HCT  41.8  MCV  90.7  PLT  214   No results found for this basename: VITAMINB12, FOLATE, FERRITIN, TIBC, IRON, RETICCTPCT,  in the last 72 hours No results found for this basename: DDIMER,  in the last 72 hours  Recent Labs Lab 10/18/12 0230  NA 139  K 3.5  CL 101  CO2 27  BUN 11  CREATININE 0.80  CALCIUM 9.3  PROT 7.1  BILITOT 0.2*  ALKPHOS 62  ALT 41  AST 27  GLUCOSE 115*   No results found for this basename: HGBA1C,  in the last 72 hours No results found for this basename: CKTOTAL, CKMB, CKMBINDEX, TROPONINI,  in the last 72 hours No components found with this basename: POCBNP,  No results found for this basename: CHOL, HDL, LDLCALC, TRIG, CHOLHDL, LDLDIRECT,  in the last 72 hours No results found for this basename: TSH, T4TOTAL, FREET3, T3FREE, THYROIDAB,  in the last 72 hours  Radiology/Studies: Dg Chest 2 View  10/18/2012   *RADIOLOGY REPORT*  Clinical Data: Chest pain, coronary artery stent placement.  CHEST - 2 VIEW  Comparison: Chest radiograph October 17, 2012  Findings: Cardiac silhouette remains mildly enlarged, mediastinal silhouette is not suspicious. No pleural effusions or focal consolidations.  Single lead left cardiac defibrillator insitu.  No pneumothorax.  Included soft tissue planes and osseous structures are not suspicious.  IMPRESSION: Mild cardiomegaly, no acute pulmonary process.   Original Report Authenticated By: Awilda Metro    10/18/12 12-lead ECG:  NSR, LA enlargement, anterolateral infarct pattern, QTc wnl.  No change from prior ECG.  ASSESSMENT:  Mr. Tangen is a 42 yo M with HTN, obesity, smoking and ICMO/HFrEF=30% s/p single-chamber Medtronic ICD with h/o prior anterior STEMI in 2007 requiring PCI+stenting LAD, recurrent LAD occlusion with further PCI+stenting in 2008 at [facility]  He has had his last remote ICD check-in on 03/15/12 which did show 1 monitored VT episode lasting 14 seconds but otherwise patient has never been shocked in the past.  Today he presents to the [facility] ED with episodic palpitations+lightheadedness.  He has not had any further ICD shocks.  PLAN:  1-Observation status, telemetry bed, service of Dr. Swaziland. 2-Medtronic ICD, device to be interrogated in AM or sooner if arrhythmia on telemetry or repeat discharge.  If interrogation shows that the ICD shock was appropriate and that he is having multiple monitored scar-related VT events then may consider AAD therapy with either sotalol or dofetilide.  Defer to team in AM/EP colleagues. 3-ICMO/HFrEF: compensated, no evidence of decompensated HF.  Continue DAPT with aspirin+Prasugrel as well as home medication regimen.  HR's are 50-70 bpm thus cannot increase BB dose any further at this point. 4-Right sided chest discomfort which comes and goes, reproducible to palpation and somewhat pleuritic.  Atypical description for angina and not like patient's usual anginal pattern.  Will however cycle troponin's to be sure.  Keep NPO incase of plans to evaluate for ischemia  although this is likely scar-related VT. 5-Repeat Echo in AM (may need echo contrast) to ensure no significant change in LV function or pericardial effusion. 6-Monitor routine labs and electrolytes.  Replete potassium. 7-DVT PPx.  Code Status:  FULL CODE.  Signed, Christie Nottingham, MD Cardiology Fellow, [facility] 10/18/2012, 4:14 AM

## 2012-10-18 NOTE — ED Notes (Signed)
Pt reports his defibrillator fired earlier today and he went to [facility] ER, was discharged. Pt states he got home and began to feel palpitations and decided to come here to be seen. On the way here he began to have right sided CP with pain going down to right toes. Denies N/V/D, no diaphoresis noted. Hx of CAD and stents. Previous MI's.

## 2012-10-18 NOTE — Progress Notes (Signed)
UR Completed Graclynn Vanantwerp Graves-Bigelow, RN,BSN [phone]  

## 2012-10-18 NOTE — Progress Notes (Signed)
Spoke with Dr. Swaziland, patient will d/c today asap. appts being set up for 2d echo and nuclear stress test.  Continues to be agitated and ready to go home. Very irritable at this point. Doesn't understand "why it takes four hours to do a little paperwork" reassured and phone number to this nurse's phone given.

## 2012-10-18 NOTE — Progress Notes (Signed)
     TELEMETRY: Reviewed telemetry pt in sinus brady: Filed Vitals:   10/18/12 0118 10/18/12 0300 10/18/12 0430 10/18/12 0440  BP: 132/73 116/65 117/71 126/78  Pulse: 62 53 52 50  Temp: 98 F (36.7 C)   97.5 F (36.4 C)  TempSrc: Oral   Oral  Resp: 16 21 21    Height:    6\' 1"  (1.854 m)  Weight:    248 lb 14.4 oz (112.9 kg)  SpO2: 100% 97% 96% 95%   No intake or output data in the 24 hours ending 10/18/12 1201  SUBJECTIVE No chest pain or SOB. No palpitations since admission.  LABS: Basic Metabolic Panel:  Recent Labs  96/04/54 0230  NA 139  K 3.5  CL 101  CO2 27  GLUCOSE 115*  BUN 11  CREATININE 0.80  CALCIUM 9.3  MG 2.2   Liver Function Tests:  Recent Labs  10/18/12 0230  AST 27  ALT 41  ALKPHOS 62  BILITOT 0.2*  PROT 7.1  ALBUMIN 3.7   No results found for this basename: LIPASE, AMYLASE,  in the last 72 hours CBC:  Recent Labs  10/18/12 0230  WBC 8.3  NEUTROABS 4.2  HGB 15.3  HCT 41.8  MCV 90.7  PLT 214   Cardiac Enzymes:  Recent Labs  10/18/12 0920  TROPONINI <0.30   Thyroid Function Tests: No results found for this basename: TSH, T4TOTAL, FREET3, T3FREE, THYROIDAB,  in the last 72 hours  Radiology/Studies:  Dg Chest 2 View  10/18/2012   *RADIOLOGY REPORT*  Clinical Data: Chest pain, coronary artery stent placement.  CHEST - 2 VIEW  Comparison: Chest radiograph October 17, 2012  Findings: Cardiac silhouette remains mildly enlarged, mediastinal silhouette is not suspicious. No pleural effusions or focal consolidations.  Single lead left cardiac defibrillator insitu.  No pneumothorax.  Included soft tissue planes and osseous structures are not suspicious.  IMPRESSION: Mild cardiomegaly, no acute pulmonary process.   Original Report Authenticated By: Awilda Metro    PHYSICAL EXAM General: Well developed, well nourished, in no acute distress. Head: Normocephalic, atraumatic, sclera non-icteric, no xanthomas, nares are without  discharge. Neck: Negative for carotid bruits. JVD not elevated. Lungs: Clear bilaterally to auscultation without wheezes, rales, or rhonchi. Breathing is unlabored. Heart: RRR S1 S2 without murmurs, rubs, or gallops.  Abdomen: Soft, non-tender, non-distended with normoactive bowel sounds. No hepatomegaly. No rebound/guarding. No obvious abdominal masses. Msk:  Strength and tone appears normal for age. Extremities: No clubbing, cyanosis or edema.  Distal pedal pulses are 2+ and equal bilaterally. Neuro: Alert and oriented X 3. Moves all extremities spontaneously. Psych:  Responds to questions appropriately with a normal affect.  ASSESSMENT AND PLAN: 1. Ventricular tachycardia s/p appropriate ICD discharge. No evidence of ischemia. Lytes are OK. No arrhythmia since ICD shock. Patient is on appropriate beta blocker therapy. I would commit to antiarrhythmic therapy unless he has recurrent VT. I recommend completing work up with an Echo and nuclear stress test. These can be done as an outpatient. Patient needs to quit smoking.   Principal Problem:   VT (ventricular tachycardia) Active Problems:   TOBACCO USER   AUTOMATIC IMPLANTABLE CARDIAC DEFIBRILLATOR SITU   CAD (coronary artery disease)   Chronic systolic congestive heart failure   Hyperlipidemia    Signed, Peter Swaziland [facility] 10/18/2012 12:01 PM

## 2012-10-18 NOTE — Discharge Summary (Signed)
Discharge Summary   Patient ID: Jonathan Fischer MRN: [id number], DOB/AGE: 41-May-1972 41 y.o. Admit date: 10/18/2012 D/C date:     10/18/2012  Primary Cardiologist: [place], [facility]  Primary Discharge Diagnoses:  1. Ventricular tachycardia s/p ICD discharge 2. Ischemic cardiomyopathy EF 25-30% s/p single-chamber Medtronic ICD   Secondary Discharge Diagnoses:  1. CAD  - history of known CAD s/p prior LAD, LCx and RCA stenting remotely for an anterior MI, recurrent MI in 2010 with LAD stent occlusion s/p thrombectomy and PTCA, STEMI 12/2011 s/p DES-prox LAD 2. HTN 3. Obesity Body mass index is 32.85 kg/(m^2). 4. Tobacco abuse 5. 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 Hyperlipidemia  Hospital Course: Jonathan Fischer is a 42 y/o M with history of HTN, obesity, tobacco abuse, CAD (h/o prior MI/stenting), ICMO/HFrEF=30% s/p single-chamber Medtronic ICD who presented to [facility] the very early morning hours of 10/18/2012 with ICD discharge. He had his last remote ICD check-in on 03/15/12 which did show 1 monitored VT episode lasting 14 seconds but otherwise patient has never been shocked in the past. On the evening of 10/17/12, he was playing softball and suddenly had an ICD shock with no warning or symptoms which led him to fall to the ground but he insists that he did not have LOC. He presented to the [facility] and called the on-call cardiologist at [facility] who advised that since patient was otherwise asymptomatic with no major lab abnormalities he could go home from the [facility] ED and follow-up with Dr. Elvis Coil office in the AM. However, upon returning home, the patient had episodic lightheadedness and brief periods of palpitations lasting several minutes at a time, on and off while at rest. He then developed right sided shooting leg pains and right-sided chest pain described as  pleuritic and reproducible to palpation and unlike his typical anginal pattern thus presented to the ER for evaluation. The patient's telemetry in the ED did not show any arrhythmias.Labwork showed K+=3.5 and Mg2+=2.2. His Troponin by iSTAT was 0.04. He was admitted to the hospital for further evaluation. Potassium supplementation was started. He appeared compensated with no acute CHF exacerbation. Chest pain was felt atypical and he ruled out for MI. He was not tachycardic, tachypnic or hypoxic. ICD interrogation showed true fast VT up to 350bpm terminating with 25J shock, 1 nonsustained episode and bigeminy just prior to the event. The patient's rhythm remained stable during his admission without further arrhythmia. Dr. Swaziland recommended to continue beta blocker and obtain outpatient 2D echocardiogram and nuclear stress test - see below for the available appointments I was able to obtain through our office. The patient will follow up with Dr. Graciela Husbands on 10/25/12. Dr. Swaziland stated he would not commit the patient to antiarrhythmic therapy unless he has recurrent VT. Tobacco cessation was recommended. Dr. Swaziland has seen and examined the patient today and feels he is stable for discharge.  With regard to potassium, in general most recent values have run 3.5-3.9. The prior value of 4.0 in December was noted as possibly hemolyzed. As a result we will prescribe low dose potassium at discharge. Will repeat BMET in 1 week to ensure potassium level is normal (ideal is =/> 4.0).   Discharge Vitals: Blood pressure 116/67, pulse 63, temperature 97.8 F (36.6 C), temperature source Oral, resp. rate 18, height 6\' 1"  (1.854 m), weight 248 lb 14.4 oz (112.9 kg), SpO2  96.00%.  Labs: Lab Results  Component Value Date   WBC 8.3 10/18/2012   HGB 15.3 10/18/2012   HCT 41.8 10/18/2012   MCV 90.7 10/18/2012   PLT 214 10/18/2012     Recent Labs Lab 10/18/12 0230  NA 139  K 3.5  CL 101  CO2 27  BUN 11  CREATININE 0.80    CALCIUM 9.3  PROT 7.1  BILITOT 0.2*  ALKPHOS 62  ALT 41  AST 27  GLUCOSE 115*    Recent Labs  10/18/12 0920  TROPONINI <0.30   Lab Results  Component Value Date   CHOL 196 12/13/2011   HDL 28* 12/13/2011   LDLCALC 115* 12/13/2011   TRIG 264* 12/13/2011    Diagnostic Studies/Procedures   Dg Chest 2 View  10/18/2012   *RADIOLOGY REPORT*  Clinical Data: Chest pain, coronary artery stent placement.  CHEST - 2 VIEW  Comparison: Chest radiograph October 17, 2012  Findings: Cardiac silhouette remains mildly enlarged, mediastinal silhouette is not suspicious. No pleural effusions or focal consolidations.  Single lead left cardiac defibrillator insitu.  No pneumothorax.  Included soft tissue planes and osseous structures are not suspicious.  IMPRESSION: Mild cardiomegaly, no acute pulmonary process.   Original Report Authenticated By: Awilda Metro    Discharge Medications     Medication List    STOP taking these medications       oxymetazoline 0.05 % nasal spray  Commonly known as:  AFRIN      TAKE these medications       aspirin 81 MG EC tablet  Take 1 tablet (81 mg total) by mouth daily.     atorvastatin 80 MG tablet  Commonly known as:  LIPITOR  Take 1 tablet (80 mg total) by mouth daily.     Choline Fenofibrate 135 MG capsule  Commonly known as:  TRILIPIX  Take 1 capsule (135 mg total) by mouth every evening.     metoprolol succinate 25 MG 24 hr tablet  Commonly known as:  TOPROL-XL  Take 1 tablet (25 mg total) by mouth daily.     nitroGLYCERIN 0.4 MG SL tablet  Commonly known as:  NITROSTAT  Place 1 tablet (0.4 mg total) under the tongue every 5 (five) minutes as needed. For chest pain     omeprazole 20 MG capsule  Commonly known as:  PRILOSEC  Take 1 capsule (20 mg total) by mouth daily.     oxyCODONE-acetaminophen 5-325 MG per tablet  Commonly known as:  PERCOCET/ROXICET  Take 1-2 tablets by mouth every 4 (four) hours as needed for pain.      penicillin v potassium 500 MG tablet  Commonly known as:  VEETID  Take 1 tablet (500 mg total) by mouth 4 (four) times daily.     potassium chloride SA 20 MEQ tablet  Commonly known as:  K-DUR,KLOR-CON  Take 1 tablet (20 mEq total) by mouth daily.     prasugrel 10 MG Tabs tablet  Commonly known as:  EFFIENT  Take 10 mg by mouth daily.     ramipril 10 MG capsule  Commonly known as:  ALTACE  Take 1 capsule (10 mg total) by mouth every evening.     spironolactone 25 MG tablet  Commonly known as:  ALDACTONE  Take 1 tablet (25 mg total) by mouth every evening.        Disposition   The patient will be discharged in stable condition to home. Discharge Orders   Future Appointments Provider Department  Dept Phone   10/25/2012 9:20 AM [facility] Office [phone]   10/25/2012 10:45 AM Duke Salvia, MD [facility] Office [phone]   11/06/2012 10:30 AM [facility] 3 Echo Echo 2 [facility] SITE 3 ECHO LAB [phone]   11/07/2012 7:45 AM [facility] Nuclear 2 (Nuc Treadm) [facility] SITE 3 NUCLEAR MED [phone]   Future Orders Complete By Expires   Diet - low sodium heart healthy  As directed    Increase activity slowly  As directed    Comments:     No driving for 6 months. You may discuss this further with Dr. Graciela Husbands.     Follow-up Information   Follow up with Sherryl Manges, MD. (10/25/12 at 10:45am. You will also have labwork that day - you can come in 15 minutes early or have the labwork checked after your visit.)    Specialty:  Cardiology   Contact information:   [address] [place] [address] [phone]       Follow up with [facility]. (11/06/12 at 10:30am - heart ultrasound)    Contact information:   [address] [place] [address]       Follow up with [facility]. (11/07/12 at 7:45am - stress test. See attached stress test instructions.)    Contact information:   [address] [place] [address]         Duration of Discharge Encounter: Greater than 30 minutes including physician and PA time.  Signed, Ronie Spies PA-C 10/18/2012, 4:35 PM

## 2012-10-18 NOTE — Progress Notes (Signed)
Patient arrived on unit from ED.  Wife at bedside.  Telemetry placed per MD order.  Vitals: BP 126/78, HR 50, T 97.5, O2 95% on ra.

## 2012-10-18 NOTE — Progress Notes (Signed)
Nutrition Brief Note  Patient identified on the Malnutrition Screening Tool (MST) Report for recent weight lost without trying and eating poorly because of a decreased appetite.  Per readings below, patient has had a 6% weight loss since December 2013; not significant for time frame.  Wt Readings from Last 15 Encounters:  10/18/12 248 lb 14.4 oz (112.9 kg)  12/17/11 264 lb 1.9 oz (119.804 kg)  12/14/11 264 lb 4.8 oz (119.886 kg)  12/14/11 264 lb 4.8 oz (119.886 kg)  12/14/11 264 lb 4.8 oz (119.886 kg)  06/01/11 283 lb (128.368 kg)  05/06/11 279 lb (126.554 kg)  04/20/11 286 lb (129.729 kg)  06/03/10 254 lb 2 oz (115.27 kg)  03/03/10 263 lb (119.296 kg)    Body mass index is 32.85 kg/(m^2). Patient meets criteria for Obesity Class I based on current BMI.   Current diet order is Heart Healthy, patient is consuming approximately 100% of meals at this time. Labs and medications reviewed.   No nutrition interventions warranted at this time. If nutrition issues arise, please consult RD.   Maureen Chatters, RD, LDN Pager #: [phone] After-Hours Pager #: [phone]

## 2012-10-18 NOTE — ED Provider Notes (Addendum)
CSN: [id number]     Arrival date & time 10/18/12  0110 History   First MD Initiated Contact with Patient 10/18/12 0301     Chief Complaint  Patient presents with  . Chest Pain   (Consider location/radiation/quality/duration/timing/severity/associated sxs/prior Treatment) HPI  Jonathan Fischer is a 42 yo man with ischemic cardiomyopathy followed by Dr. [place] of [facility]. He has an ICD which fired for the first time around 1930 last night.  The patient sought care at [facility]. His wife reports that he had normal labs, EKG and chest x-ray and was discharge with instructions to followup with his cardiologist.  The patient got home, he developed intermittent episodes of racing heartbeat associated with lightheadedness. These episodes occurred while at rest. Patient says the episode lasted several minutes. He within the asymptomatic for several minutes then symptoms would begin again for several minutes. He denies associated shortness of breath. He does note some mild, aching chest discomfort just to the right of midline which he says is not consistent with previous episodes of angina. He rated his pain at 5 on a 0-to-10 scale.  The patient reports compliance with all medications. He denies any recent medication changes. He continues to smoke one pack per day of cigarettes. He denies illicit drug use. Prior to last night, his ICD has not fired before.   The patient has a Medtronic ICD which was placed at this hospital.   Past Medical History  Diagnosis Date  . MI (myocardial infarction) 10/2005, 03/2009, 2013    ANTERIOR  . MI, acute, non ST segment elevation 04/2008    WITH INTERVENTION TO THE RIGHT CORONARY  . Obesity   . Hyperlipidemia   . Coronary artery disease   . ICD (implantable cardiac defibrillator) in place   . Tobacco abuse   . Chronic systolic CHF (congestive heart failure)   . Ischemic cardiomyopathy   . Tobacco abuse    Past Surgical History   Procedure Laterality Date  . Cardiac catheterization  03/31/2009  . Cardiac catheterization  05/08/2008    CARDIAC SIZE AND SILHOUETTE NORMAL. THERE WAS ANTERIOR APICAL HYPOKINESIS WITH EF 35-40%  . Coronary angioplasty      BALLOON ANGIOPLASTY TO THE LAD WITH THROMBUS EXTRACTION OF THE PREVIOUSLY STENTED SEGMENT AND NEW STENT PLACEMENT TO THE LEFT CIRCUMFLEX. EF IS DOWN IN THE 30% RANGE  . Cardiac defibrillator placement  11/2009  . Coronary angioplasty with stent placement  12/12/2011    30% mid LCx, mid RCA & mid LCx stents patent. LVEF 25-30%, mid-distal anterolateral wall AK, apex dilated, dyskinetic s/p DES-prox LAD stenosis.   Family History  Problem Relation Age of Onset  . Cancer Mother   . Heart disease Father    History  Substance Use Topics  . Smoking status: Current Every Day Smoker -- 1.50 packs/day for 24 years    Types: Cigarettes  . Smokeless tobacco: Never Used  . Alcohol Use: No    Review of Systems 10 point review of systems performed and is negative with the exception of symptoms noted above  Allergies  Review of patient's allergies indicates no known allergies.  Home Medications   Current Outpatient Rx  Name  Route  Sig  Dispense  Refill  . aspirin EC 81 MG EC tablet   Oral   Take 1 tablet (81 mg total) by mouth daily.   30 tablet   3   . atorvastatin (LIPITOR) 80 MG tablet   Oral  Take 1 tablet (80 mg total) by mouth daily.   30 tablet   6   . Choline Fenofibrate (TRILIPIX) 135 MG capsule   Oral   Take 1 capsule (135 mg total) by mouth every evening.   30 capsule   5   . metoprolol succinate (TOPROL-XL) 25 MG 24 hr tablet   Oral   Take 1 tablet (25 mg total) by mouth daily.   30 tablet   5   . nitroGLYCERIN (NITROSTAT) 0.4 MG SL tablet   Sublingual   Place 1 tablet (0.4 mg total) under the tongue every 5 (five) minutes as needed. For chest pain   25 tablet   3   . omeprazole (PRILOSEC) 20 MG capsule   Oral   Take 1 capsule (20 mg  total) by mouth daily.   30 capsule   5   . oxyCODONE-acetaminophen (PERCOCET/ROXICET) 5-325 MG per tablet   Oral   Take 1-2 tablets by mouth every 4 (four) hours as needed for pain.   15 tablet   0   . oxymetazoline (AFRIN) 0.05 % nasal spray   Nasal   Place 2 sprays into the nose 2 (two) times daily as needed for congestion.         . penicillin v potassium (VEETID) 500 MG tablet   Oral   Take 1 tablet (500 mg total) by mouth 4 (four) times daily.   40 tablet   0   . prasugrel (EFFIENT) 10 MG TABS   Oral   Take 10 mg by mouth daily.         . ramipril (ALTACE) 10 MG capsule   Oral   Take 1 capsule (10 mg total) by mouth every evening.   30 capsule   5   . spironolactone (ALDACTONE) 25 MG tablet   Oral   Take 1 tablet (25 mg total) by mouth every evening.   30 tablet   6    BP 116/65  Pulse 53  Temp(Src) 98 F (36.7 C) (Oral)  Resp 21  SpO2 97% Physical Exam Gen: well developed and well nourished appearing Head: NCAT Eyes: PERL, EOMI Nose: no epistaixis or rhinorrhea Mouth/throat: mucosa is moist and pink Neck: supple, no stridor Lungs: CTA B, no wheezing, rhonchi or rales Chest wal: mild ttp just to right of sternum on palpation CV: RRR, approx 60 bpm, no murmur, extremities well perfused.  Abd: soft, obese, notender, nondistended Back: no ttp, no cva ttp Skin: no rashese, wnl Neuro: CN ii-xii grossly intact, no focal deficits Psyche; anxious affect,  calm and cooperative.  ED Course  Procedures (including critical care time) Labs Review  EKG: NSR, q waves in anterolateral leads, normal intervals, normal ST-T segmenets, normal axis.   CXR: mild cardiomegaly, no acute pulmonary process.   Results for orders placed during the hospital encounter of 10/18/12 (from the past 24 hour(s))  CBC WITH DIFFERENTIAL     Status: Abnormal   Collection Time    10/18/12  2:30 AM      Result Value Range   WBC 8.3  4.0 - 10.5 K/uL   RBC 4.61  4.22 - 5.81  MIL/uL   Hemoglobin 15.3  13.0 - 17.0 g/dL   HCT 16.1  09.6 - 04.5 %   MCV 90.7  78.0 - 100.0 fL   MCH 33.2  26.0 - 34.0 pg   MCHC 36.6 (*) 30.0 - 36.0 g/dL   RDW 40.9  81.1 - 91.4 %  Platelets 214  150 - 400 K/uL   Neutrophils Relative % 51  43 - 77 %   Neutro Abs 4.2  1.7 - 7.7 K/uL   Lymphocytes Relative 41  12 - 46 %   Lymphs Abs 3.4  0.7 - 4.0 K/uL   Monocytes Relative 6  3 - 12 %   Monocytes Absolute 0.5  0.1 - 1.0 K/uL   Eosinophils Relative 2  0 - 5 %   Eosinophils Absolute 0.2  0.0 - 0.7 K/uL   Basophils Relative 1  0 - 1 %   Basophils Absolute 0.0  0.0 - 0.1 K/uL  COMPREHENSIVE METABOLIC PANEL     Status: Abnormal   Collection Time    10/18/12  2:30 AM      Result Value Range   Sodium 139  135 - 145 mEq/L   Potassium 3.5  3.5 - 5.1 mEq/L   Chloride 101  96 - 112 mEq/L   CO2 27  19 - 32 mEq/L   Glucose, Bld 115 (*) 70 - 99 mg/dL   BUN 11  6 - 23 mg/dL   Creatinine, Ser 1.61  0.50 - 1.35 mg/dL   Calcium 9.3  8.4 - 09.6 mg/dL   Total Protein 7.1  6.0 - 8.3 g/dL   Albumin 3.7  3.5 - 5.2 g/dL   AST 27  0 - 37 U/L   ALT 41  0 - 53 U/L   Alkaline Phosphatase 62  39 - 117 U/L   Total Bilirubin 0.2 (*) 0.3 - 1.2 mg/dL   GFR calc non Af Amer >90  >90 mL/min   GFR calc Af Amer >90  >90 mL/min    MDM   Patient with symptomatic tachycardia and history of ischemic cardiomyopathy EF 25% to 35% s/p shock x 1 by ICD. ED work up is non-diagnostic Magnesium level is pending. Troponin is wnl and patient had normal Troponin at outside hospital around 2200 last night. Normal K.  Concern is for recurrent ventricular dysrythmia vs. Oversensing of atrial tachycardia.  I have paged [facility] to request admission for further evaluation and ICD interrogation.     Brandt Loosen, MD 10/18/12 0454  0413: Case discussed with Dr. Clelia Croft who will see and admit.   Brandt Loosen, MD 10/18/12 [phone]

## 2012-10-25 ENCOUNTER — Encounter: Payer: Medicare Other | Admitting: Internal Medicine

## 2012-10-25 ENCOUNTER — Other Ambulatory Visit: Payer: Medicare Other

## 2012-11-03 ENCOUNTER — Other Ambulatory Visit: Payer: Medicare Other

## 2012-11-03 ENCOUNTER — Encounter: Payer: Self-pay | Admitting: Cardiology

## 2012-11-03 ENCOUNTER — Ambulatory Visit (INDEPENDENT_AMBULATORY_CARE_PROVIDER_SITE_OTHER): Payer: Medicare Other | Admitting: Cardiology

## 2012-11-03 VITALS — BP 116/76 | HR 56 | Ht 73.0 in | Wt 250.1 lb

## 2012-11-03 DIAGNOSIS — I5022 Chronic systolic (congestive) heart failure: Secondary | ICD-10-CM

## 2012-11-03 DIAGNOSIS — I472 Ventricular tachycardia: Secondary | ICD-10-CM

## 2012-11-03 DIAGNOSIS — I255 Ischemic cardiomyopathy: Secondary | ICD-10-CM

## 2012-11-03 DIAGNOSIS — I2589 Other forms of chronic ischemic heart disease: Secondary | ICD-10-CM

## 2012-11-03 DIAGNOSIS — Z95810 Presence of automatic (implantable) cardiac defibrillator: Secondary | ICD-10-CM

## 2012-11-03 LAB — BASIC METABOLIC PANEL
BUN: 14 mg/dL (ref 6–23)
Calcium: 9.6 mg/dL (ref 8.4–10.5)
Chloride: 102 mEq/L (ref 96–112)
Glucose, Bld: 88 mg/dL (ref 70–99)
Potassium: 3.7 mEq/L (ref 3.5–5.1)

## 2012-11-03 NOTE — Progress Notes (Signed)
ELECTROPHYSIOLOGY OFFICE NOTE  Patient ID: Jonathan Fischer MRN: [id number], DOB/AGE: [birth year]   Date of Visit: 11/03/2012  Primary Physician: No primary provider on file. Primary Cardiologist / Primary EP: [place], MD / Jonathan Husbands, MD Reason for Visit: Hospital follow-up  History of Present Illness  Jonathan Fischer is a 42 y.o. male HTN, obesity, tobacco abuse, CAD (h/o prior MI/stenting), ICM EF 30% s/p single-chamber Medtronic ICD who presented to [facility] on 10/18/2012 with ICD shock. ICD interrogation showed true fast VT up to 350 bpm terminated with 25J shock and 1 nonsustained episode and bigeminy just prior to the event. K 3.5. Mg 2.2. He was admitted for observation. He appeared compensated with no acute HF exacerbation. Chest pain was felt atypical and he ruled out for MI. His rhythm remained stable during his admission without further arrhythmia. Dr. [place] recommended he continue beta blocker and obtain outpatient 2D echocardiogram and nuclear stress test. He presents today for hospital follow-up.  Since discharge, he reports he is doing well and has no complaints. He denies chest pain or shortness of breath. He denies palpitations, dizziness, near syncope or syncope. He denies LE swelling, orthopnea, PND or recent weight gain. He denies ICD shocks. He is compliant and tolerating medications without difficulty.  Past Medical History Past Medical History  Diagnosis Date  . CAD (coronary artery disease)     s/p prior LAD, LCx and RCA stenting remotely for an anterior MI, recurrent MI in 2010 with LAD stent occlusion s/p thrombectomy and PTCA, STEMI 12/2011 s/p DES-prox LAD  . Obesity   . Hyperlipidemia   . ICD (implantable cardiac defibrillator) in place   . Tobacco abuse   . Chronic systolic CHF (congestive heart failure)   . Ischemic cardiomyopathy     EF 25-30% s/p single-chamber Medtronic ICD   . HTN (hypertension)   . Abnormal echocardiogram     a. Possible small  layer of mural apical thrombus without mobility by echo 12/2011, not felt to be candidate for anticoagulation due to noncompliance.    Past Surgical History Past Surgical History  Procedure Laterality Date  . Cardiac catheterization  03/31/2009  . Cardiac catheterization  05/08/2008    CARDIAC SIZE AND SILHOUETTE NORMAL. THERE WAS ANTERIOR APICAL HYPOKINESIS WITH EF 35-40%  . Coronary angioplasty      BALLOON ANGIOPLASTY TO THE LAD WITH THROMBUS EXTRACTION OF THE PREVIOUSLY STENTED SEGMENT AND NEW STENT PLACEMENT TO THE LEFT CIRCUMFLEX. EF IS DOWN IN THE 30% RANGE  . Cardiac defibrillator placement  11/2009  . Coronary angioplasty with stent placement  12/12/2011    30% mid LCx, mid RCA & mid LCx stents patent. LVEF 25-30%, mid-distal anterolateral wall AK, apex dilated, dyskinetic s/p DES-prox LAD stenosis.    Allergies/Intolerances No Known Allergies  Current Home Medications Current Outpatient Prescriptions  Medication Sig Dispense Refill  . aspirin EC 81 MG EC tablet Take 1 tablet (81 mg total) by mouth daily.  30 tablet  3  . atorvastatin (LIPITOR) 80 MG tablet Take 1 tablet (80 mg total) by mouth daily.  30 tablet  6  . Choline Fenofibrate (TRILIPIX) 135 MG capsule Take 1 capsule (135 mg total) by mouth every evening.  30 capsule  5  . metoprolol succinate (TOPROL-XL) 25 MG 24 hr tablet Take 1 tablet (25 mg total) by mouth daily.  30 tablet  5  . nitroGLYCERIN (NITROSTAT) 0.4 MG SL tablet Place 1 tablet (0.4 mg total) under the tongue every 5 (five) minutes as  needed. For chest pain  25 tablet  3  . omeprazole (PRILOSEC) 20 MG capsule Take 1 capsule (20 mg total) by mouth daily.  30 capsule  5  . penicillin v potassium (VEETID) 500 MG tablet Take 1 tablet (500 mg total) by mouth 4 (four) times daily.  40 tablet  0  . potassium chloride SA (K-DUR,KLOR-CON) 20 MEQ tablet Take 1 tablet (20 mEq total) by mouth daily.  30 tablet  2  . prasugrel (EFFIENT) 10 MG TABS Take 10 mg by mouth  daily.      . ramipril (ALTACE) 10 MG capsule Take 1 capsule (10 mg total) by mouth every evening.  30 capsule  5  . spironolactone (ALDACTONE) 25 MG tablet Take 1 tablet (25 mg total) by mouth every evening.  30 tablet  6   No current facility-administered medications for this visit.    Social History Social History  . Marital Status: Married   Social History Main Topics  . Smoking status: Current Every Day Smoker -- 1.50 packs/day for 24 years    Types: Cigarettes  . Smokeless tobacco: Never Used  . Alcohol Use: No  . Drug Use: No   Review of Systems General: No chills, fever, night sweats or weight changes Cardiovascular: No chest pain, dyspnea on exertion, edema, orthopnea, palpitations, paroxysmal nocturnal dyspnea Dermatological: No rash, lesions or masses Respiratory: No cough, dyspnea Urologic: No hematuria, dysuria Abdominal: No nausea, vomiting, diarrhea, bright red blood per rectum, melena, or hematemesis Neurologic: No visual changes, weakness, changes in mental status All other systems reviewed and are otherwise negative except as noted above.  Physical Exam Vitals: Blood pressure 116/76, pulse 56, height 6\' 1"  (1.854 m), weight 250 lb 1.9 oz (113.454 kg), SpO2 97.00%.  General: Well developed, well appearing 42 y.o. male in no acute distress. HEENT: Normocephalic, atraumatic. EOMs intact. Sclera nonicteric. Oropharynx clear.  Neck: Supple. No JVD. Lungs: Respirations regular and unlabored, CTA bilaterally. No wheezes, rales or rhonchi. Heart: RRR. S1, S2 present. No murmurs, rub, S3 or S4. Abdomen: Soft, non-distended.   Extremities: No clubbing, cyanosis or edema. PT/Radials 2+ and equal bilaterally. Psych: Normal affect. Neuro: Alert and oriented X 3. Moves all extremities spontaneously.   Diagnostics Device interrogation - full interrogation from hospital done 10/19/2012 which revealed normal ICD function and fast VT successfully terminated 25J shock x 1  (please see separate report from hospital); quick look today for arrhythmias shows no arrhythmias / episodes since hospital discharge  Assessment and Plan 1. Paroxysmal VT No recurrence since hospital discharge Continue BB During hospitalization, Dr. [place] did not want to start AAD therapy at this time 2D echo and stress test scheduled next week No driving for 6 months s/p ICD shock Counseled at length regarding shock plan and prevention of SCD due to VT/VF  2. Ischemic CM s/p ICD implant Normal device function by recent interrogation No programming changes made  Start remote ICD checks every 3 months (ordered new transmitter) Return to see Dr. Graciela Fischer in Jan 2015 as scheduled unless needed sooner  3. Chronic systolic HF Stable without HF symptoms Continue medical therapy  Signed, Rick Duff, PA-C 11/03/2012, 3:54 PM

## 2012-11-03 NOTE — Patient Instructions (Signed)
Your physician recommends that you schedule a follow-up appointment in: DR. [place]iland IN 3-4 WEEKS  Your physician recommends that you continue on your current medications as directed. Please refer to the Current Medication list given to you today.  Your physician recommends that you return for lab work in: BMET TODAY

## 2012-11-06 ENCOUNTER — Ambulatory Visit (HOSPITAL_COMMUNITY): Payer: Medicare Other | Attending: Cardiovascular Disease | Admitting: Radiology

## 2012-11-06 DIAGNOSIS — I509 Heart failure, unspecified: Secondary | ICD-10-CM | POA: Insufficient documentation

## 2012-11-06 DIAGNOSIS — F17200 Nicotine dependence, unspecified, uncomplicated: Secondary | ICD-10-CM | POA: Insufficient documentation

## 2012-11-06 DIAGNOSIS — E785 Hyperlipidemia, unspecified: Secondary | ICD-10-CM | POA: Insufficient documentation

## 2012-11-06 DIAGNOSIS — I255 Ischemic cardiomyopathy: Secondary | ICD-10-CM

## 2012-11-06 DIAGNOSIS — E669 Obesity, unspecified: Secondary | ICD-10-CM | POA: Insufficient documentation

## 2012-11-06 DIAGNOSIS — I472 Ventricular tachycardia: Secondary | ICD-10-CM

## 2012-11-06 DIAGNOSIS — I2510 Atherosclerotic heart disease of native coronary artery without angina pectoris: Secondary | ICD-10-CM | POA: Insufficient documentation

## 2012-11-06 DIAGNOSIS — I5022 Chronic systolic (congestive) heart failure: Secondary | ICD-10-CM

## 2012-11-06 DIAGNOSIS — I2589 Other forms of chronic ischemic heart disease: Secondary | ICD-10-CM | POA: Insufficient documentation

## 2012-11-06 DIAGNOSIS — I252 Old myocardial infarction: Secondary | ICD-10-CM | POA: Insufficient documentation

## 2012-11-06 DIAGNOSIS — Z95810 Presence of automatic (implantable) cardiac defibrillator: Secondary | ICD-10-CM | POA: Insufficient documentation

## 2012-11-06 NOTE — Progress Notes (Signed)
Echocardiogram performed.  

## 2012-11-07 ENCOUNTER — Encounter: Payer: Medicare Other | Admitting: Nurse Practitioner

## 2012-11-07 ENCOUNTER — Encounter: Payer: Self-pay | Admitting: Cardiology

## 2012-11-07 ENCOUNTER — Ambulatory Visit (HOSPITAL_COMMUNITY): Payer: Medicare Other | Attending: Cardiology | Admitting: Radiology

## 2012-11-07 VITALS — BP 115/69 | HR 51 | Ht 73.0 in | Wt 251.0 lb

## 2012-11-07 DIAGNOSIS — R42 Dizziness and giddiness: Secondary | ICD-10-CM | POA: Insufficient documentation

## 2012-11-07 DIAGNOSIS — R002 Palpitations: Secondary | ICD-10-CM | POA: Insufficient documentation

## 2012-11-07 DIAGNOSIS — I472 Ventricular tachycardia: Secondary | ICD-10-CM

## 2012-11-07 DIAGNOSIS — R079 Chest pain, unspecified: Secondary | ICD-10-CM | POA: Insufficient documentation

## 2012-11-07 DIAGNOSIS — R9439 Abnormal result of other cardiovascular function study: Secondary | ICD-10-CM | POA: Insufficient documentation

## 2012-11-07 DIAGNOSIS — I252 Old myocardial infarction: Secondary | ICD-10-CM | POA: Insufficient documentation

## 2012-11-07 DIAGNOSIS — I4510 Unspecified right bundle-branch block: Secondary | ICD-10-CM | POA: Insufficient documentation

## 2012-11-07 DIAGNOSIS — I10 Essential (primary) hypertension: Secondary | ICD-10-CM | POA: Insufficient documentation

## 2012-11-07 DIAGNOSIS — I2510 Atherosclerotic heart disease of native coronary artery without angina pectoris: Secondary | ICD-10-CM | POA: Insufficient documentation

## 2012-11-07 DIAGNOSIS — Z8249 Family history of ischemic heart disease and other diseases of the circulatory system: Secondary | ICD-10-CM | POA: Insufficient documentation

## 2012-11-07 DIAGNOSIS — R51 Headache: Secondary | ICD-10-CM | POA: Insufficient documentation

## 2012-11-07 DIAGNOSIS — Z95810 Presence of automatic (implantable) cardiac defibrillator: Secondary | ICD-10-CM | POA: Insufficient documentation

## 2012-11-07 MED ORDER — TECHNETIUM TC 99M SESTAMIBI GENERIC - CARDIOLITE
33.0000 | Freq: Once | INTRAVENOUS | Status: AC | PRN
  Administered 2012-11-07: 33 via INTRAVENOUS

## 2012-11-07 MED ORDER — REGADENOSON 0.4 MG/5ML IV SOLN
0.4000 mg | Freq: Once | INTRAVENOUS | Status: AC
  Administered 2012-11-07: 0.4 mg via INTRAVENOUS

## 2012-11-07 MED ORDER — TECHNETIUM TC 99M SESTAMIBI GENERIC - CARDIOLITE
11.0000 | Freq: Once | INTRAVENOUS | Status: AC | PRN
  Administered 2012-11-07: 11 via INTRAVENOUS

## 2012-11-07 NOTE — Progress Notes (Signed)
[facility] SITE 3 NUCLEAR MED [address] [place], [place] [address] [phone]    Cardiology Nuclear Med Study  Jonathan Fischer is a 42 y.o. male     MRN : [id number]     DOB: 18-Jul-1970  Procedure Date: 11/07/2012  Nuclear Med Background Indication for Stress Test:  Evaluation for Ischemia, Stent Patency and Post Hospital:10/18/12 V Tach/ICD Discharge,Chest Pain,(-) enzymes History:  CAD;MIs';Stents; AICD; '13 Echo 25-30%, [facility] Cardiac Risk Factors: Family History - CAD, History of Smoking, Hypertension and Lipids  Symptoms:  Chest Pain, Dizziness and Palpitations   Nuclear Pre-Procedure Caffeine/Decaff Intake:  7:00pm NPO After: 7:00pm   Lungs:  clear O2 Sat: 94% on room air. IV 0.9% NS with Angio Cath:  20g  IV Site: R Hand  IV Started by:  Cathlyn Parsons, RN  Chest Size (in):  50 Cup Size: n/a  Height: 6\' 1"  (1.854 m)  Weight:  251 lb (113.853 kg)  BMI:  Body mass index is 33.12 kg/(m^2). Tech Comments:  No Toprol x 10 hrs    Nuclear Med Study 1 or 2 day study: 1 day  Stress Test Type:  Lexiscan  Reading MD: Willa Rough, MD  Order Authorizing Provider:  Vonna Drafts  Resting Radionuclide: Technetium 1m Sestamibi  Resting Radionuclide Dose: 11.0 mCi   Stress Radionuclide:  Technetium 68m Sestamibi  Stress Radionuclide Dose: 33.0 mCi           Stress Protocol Rest HR: 51 Stress HR: 88  Rest BP: 115/69 Stress BP: 147/76  Exercise Time (min): n/a METS: n/a   Predicted Max HR: 179 bpm % Max HR: 49.16 bpm Rate Pressure Product: 59563   Dose of Adenosine (mg):  n/a Dose of Lexiscan: 0.4 mg  Dose of Atropine (mg): n/a Dose of Dobutamine: n/a mcg/kg/min (at max HR)  Stress Test Technologist: Nelson Chimes, BS-ES  Nuclear Technologist:  Dario Guardian, CNMT     Rest Procedure:  Myocardial perfusion imaging was performed at rest 45 minutes following the intravenous administration of Technetium 36m Sestamibi. Rest ECG: Normal sinus rhythm.  Incomplete right bundle branch block. Old anterolateral MI  Stress Procedure:  The patient received IV Lexiscan 0.4 mg over 15-seconds.  Technetium 81m Sestamibi injected at 30-seconds.  Quantitative spect images were obtained after a 45 minute delay. Patient complained of head pressure with the infusion of Lexiscan.  Symptoms began to resolve in recovery.  Stress ECG: No significant change from baseline ECG  QPS Raw Data Images:  Normal; no motion artifact; normal heart/lung ratio. Stress Images:  There is a large area of severe decreased uptake affecting the following segments: Apical cap, mid inferoseptal, mid anteroseptal, apical septal, mid anterior, apical anterior, mid anterolateral, apical lateral, apical inferior. This is a fixed defect. Rest Images:  The imaging at rest is the same as the image with stress. Subtraction (SDS):  No evidence of ischemia. Transient Ischemic Dilatation (Normal <1.22):  0.99 Lung/Heart Ratio (Normal <0.45):  0.33  Quantitative Gated Spect Images QGS EDV:  231 ml QGS ESV:  148 ml  Impression Exercise Capacity:  Lexiscan with no exercise. BP Response:  Normal blood pressure response. Clinical Symptoms:  The patient felt head pressure ECG Impression:  No significant ST segment change suggestive of ischemia. Comparison with Prior Nuclear Study:    There is no prior study for comparison  Overall Impression:  This study is abnormal. There is a large scar affecting the entire apex, mid anterior wall, mid septum, and mid anterolateral  wall. There is no significant ischemia. This is a high-risk scan.  LV Ejection Fraction: 36%.  LV Wall Motion:  There is severe hypokinesis of the entire apex, mid anterior wall, mid septum, mid anterolateral wall.  Willa Rough, MD

## 2012-11-08 NOTE — Addendum Note (Signed)
Addended by: Domenic Polite on: 11/08/2012 08:56 AM   Modules accepted: Orders

## 2012-11-10 ENCOUNTER — Encounter: Payer: Self-pay | Admitting: Internal Medicine

## 2012-11-17 ENCOUNTER — Telehealth: Payer: Self-pay

## 2012-11-17 NOTE — Telephone Encounter (Signed)
samples

## 2012-12-14 ENCOUNTER — Encounter (INDEPENDENT_AMBULATORY_CARE_PROVIDER_SITE_OTHER): Payer: Self-pay

## 2012-12-14 ENCOUNTER — Ambulatory Visit (INDEPENDENT_AMBULATORY_CARE_PROVIDER_SITE_OTHER): Payer: Medicare Other | Admitting: Cardiology

## 2012-12-14 ENCOUNTER — Encounter: Payer: Self-pay | Admitting: Cardiology

## 2012-12-14 VITALS — BP 137/84 | HR 60 | Ht 73.0 in | Wt 255.0 lb

## 2012-12-14 DIAGNOSIS — F17200 Nicotine dependence, unspecified, uncomplicated: Secondary | ICD-10-CM

## 2012-12-14 DIAGNOSIS — I509 Heart failure, unspecified: Secondary | ICD-10-CM

## 2012-12-14 DIAGNOSIS — I472 Ventricular tachycardia: Secondary | ICD-10-CM

## 2012-12-14 DIAGNOSIS — I2510 Atherosclerotic heart disease of native coronary artery without angina pectoris: Secondary | ICD-10-CM

## 2012-12-14 DIAGNOSIS — I5022 Chronic systolic (congestive) heart failure: Secondary | ICD-10-CM

## 2012-12-14 NOTE — Patient Instructions (Signed)
Continue your efforts at smoking cessation.  Continue your current medication.  I will see you in 3 months with lab work

## 2012-12-14 NOTE — Progress Notes (Signed)
Jonathan Fischer Date of Birth: 19-Aug-1970 Medical Record #[id number]  History of Present Illness: Jonathan Fischer is seen back today for a followup visit. He  has extensive CAD. Has an ischemic CM with EF of 25 to 30%. Has an ICD in place. Has had multiple MIs and multiple PCIs. Continues to smoke.   Most recently had STEMI in December 2013 with occluded proximal LAD treated with repeat DES of the proximal LAD (occluded at the site of the prior stent). Stent in the LCX was patent. Stent in the mid RCA was patent. EF 25 to 30%. Troponin over 20. Echo shoed EF of 25 to 30% as well, septal apical and anterior wall AK and possible small layer of mural apical thrombus without mobility. Not felt to be a candidate for further anticoagulation due to his noncompliance.   He was readmitted in October 2014 with an ICD discharge. Interrogation confirmed this was ventricular tachycardia. He had normal cardiac enzymes. Potassium was mildly decreased. He had no evidence of increased congestive heart failure. We recommended continued medical therapy with beta blocker and potassium repletion. Outpatient echocardiogram and stress Myoview were scheduled. The results are noted below. ICD interrogation and followup showed no recurrent episodes of ventricular tachycardia. On followup today he states he is feeling well. He's had no recurrent tachycardia, chest pain, or shortness of breath. He is trying to quit smoking. He has reduced his cigarette intake to 4 cigarettes per day but is using e- cigarettes frequently.  Current Outpatient Prescriptions on File Prior to Visit  Medication Sig Dispense Refill  . aspirin EC 81 MG EC tablet Take 1 tablet (81 mg total) by mouth daily.  30 tablet  3  . atorvastatin (LIPITOR) 80 MG tablet Take 1 tablet (80 mg total) by mouth daily.  30 tablet  6  . Choline Fenofibrate (TRILIPIX) 135 MG capsule Take 1 capsule (135 mg total) by mouth every evening.  30 capsule  5  . metoprolol succinate  (TOPROL-XL) 25 MG 24 hr tablet Take 1 tablet (25 mg total) by mouth daily.  30 tablet  5  . nitroGLYCERIN (NITROSTAT) 0.4 MG SL tablet Place 1 tablet (0.4 mg total) under the tongue every 5 (five) minutes as needed. For chest pain  25 tablet  3  . omeprazole (PRILOSEC) 20 MG capsule Take 1 capsule (20 mg total) by mouth daily.  30 capsule  5  . potassium chloride SA (K-DUR,KLOR-CON) 20 MEQ tablet Take 1 tablet (20 mEq total) by mouth daily.  30 tablet  2  . prasugrel (EFFIENT) 10 MG TABS Take 10 mg by mouth daily.      . ramipril (ALTACE) 10 MG capsule Take 1 capsule (10 mg total) by mouth every evening.  30 capsule  5  . spironolactone (ALDACTONE) 25 MG tablet Take 1 tablet (25 mg total) by mouth every evening.  30 tablet  6   No current facility-administered medications on file prior to visit.    No Known Allergies  Past Medical History  Diagnosis Date  . CAD (coronary artery disease)     s/p prior LAD, LCx and RCA stenting remotely for an anterior MI, recurrent MI in 2010 with LAD stent occlusion s/p thrombectomy and PTCA, STEMI 12/2011 s/p DES-prox LAD  . Obesity   . Hyperlipidemia   . ICD (implantable cardiac defibrillator) in place   . Tobacco abuse   . Chronic systolic CHF (congestive heart failure)   . Ischemic cardiomyopathy     EF 25-30% s/p  single-chamber Medtronic ICD   . HTN (hypertension)   . Abnormal echocardiogram     a. 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    Past Surgical History  Procedure Laterality Date  . Cardiac catheterization  03/31/2009  . Cardiac catheterization  05/08/2008    CARDIAC SIZE AND SILHOUETTE NORMAL. THERE WAS ANTERIOR APICAL HYPOKINESIS WITH EF 35-40%  . Coronary angioplasty      BALLOON ANGIOPLASTY TO THE LAD WITH THROMBUS EXTRACTION OF THE PREVIOUSLY STENTED SEGMENT AND NEW STENT PLACEMENT TO THE LEFT CIRCUMFLEX. EF IS DOWN IN THE 30%  RANGE  . Cardiac defibrillator placement  11/2009  . Coronary angioplasty with stent placement  12/12/2011    30% mid LCx, mid RCA & mid LCx stents patent. LVEF 25-30%, mid-distal anterolateral wall AK, apex dilated, dyskinetic s/p DES-prox LAD stenosis.    History  Smoking status  . Current Every Day Smoker -- 1.50 packs/day for 24 years  . Types: Cigarettes  Smokeless tobacco  . Never Used    History  Alcohol Use No    Family History  Problem Relation Age of Onset  . Cancer Mother   . Heart disease Father     Review of Systems: The review of systems is per the HPI.  All other systems were reviewed and are negative.  Physical Exam: BP 137/84  Pulse 60  Ht 6\' 1"  (1.854 m)  Wt 255 lb (115.667 kg)  BMI 33.65 kg/m2 Patient is in no acute distress. He is obese. Smells of tobacco. Skin is warm and dry. Color is normal.  HEENT is unremarkable. Normocephalic/atraumatic. PERRL. Sclera are nonicteric. Neck is supple. No masses. No JVD. Lungs are clear. Cardiac exam shows a regular rate and rhythm. Abdomen is soft. Extremities are without edema.  Gait and ROM are intact. No gross neurologic deficits noted.   LABORATORY DATA:  Lab Results  Component Value Date   WBC 8.3 10/18/2012   HGB 15.3 10/18/2012   HCT 41.8 10/18/2012   PLT 214 10/18/2012   GLUCOSE 88 11/03/2012   CHOL 196 12/13/2011   TRIG 264* 12/13/2011   HDL 28* 12/13/2011   LDLCALC 115* 12/13/2011   ALT 41 10/18/2012   AST 27 10/18/2012   NA 139 11/03/2012   K 3.7 11/03/2012   CL 102 11/03/2012   CREATININE 1.0 11/03/2012   BUN 14 11/03/2012   CO2 28 11/03/2012   TSH 0.633 12/12/2011   INR 1.08 12/12/2011   HGBA1C 5.6 12/12/2011   Lab Results  Component Value Date   CKTOTAL 161 03/31/2009   CKMB 3.7 03/31/2009   TROPONINI <0.30 10/18/2012    Echo Study Conclusions 11/06/2012   Study Conclusions  - Left ventricle: The cavity size was moderately dilated. Systolic function was moderately to severely reduced.  The estimated ejection fraction was in the range of 30% to 35%. Akinesis of the mid-distalanteroseptal myocardium. - Left atrium: The atrium was mildly to moderately dilated. - Right ventricle: Systolic function was moderately reduced.   LexiScan Myoview study conclusion 11/08/2012  Impression  Exercise Capacity: Lexiscan with no exercise.  BP Response: Normal blood pressure response.  Clinical Symptoms: The patient felt head pressure  ECG Impression: No significant ST segment change suggestive of ischemia.  Comparison with Prior Nuclear Study: There is no prior study for comparison  Overall Impression: This study is abnormal. There is a large scar affecting the entire apex, mid  anterior wall, mid septum, and mid anterolateral wall. There is no significant ischemia. This is a high-risk scan.  LV Ejection Fraction: 36%. LV Wall Motion: There is severe hypokinesis of the entire apex, mid anterior wall, mid septum, mid anterolateral wall.  Willa Rough, MD     Assessment / Plan:  1. CAD with recent STEMI/repeat DES to the LAD. No recurrent chest pain. No ischemia noted on Myoview study. Extensive anterior apical scar. Continue medical therapy.  2. Ischemic CM - EF is 30-35 %. Appears to be well compensated. We'll continue with his current medical therapy and diuretic therapy.   3. HLD  4. Ongoing tobacco abuse - he says he is ready to quit and knows that he has to quit. Attempting to quit with the use of e- cigarettes. Has failed Chantix and Wellbutrin in the past.  5. Ventricular tachycardia. No clear triggers. Potassium was low. We'll continue potassium supplementation and beta blocker therapy. We will monitor for now. If he has recurrent ventricular tachycardia may need to consider antiarrhythmic drug therapy. We'll reassess his ICD in January.  I will see him back in 3 months and we will check fasting lab work at that time.

## 2013-01-26 ENCOUNTER — Encounter: Payer: Self-pay | Admitting: Cardiology

## 2013-01-29 ENCOUNTER — Encounter: Payer: Medicare Other | Admitting: *Deleted

## 2013-02-06 ENCOUNTER — Encounter: Payer: Self-pay | Admitting: *Deleted

## 2013-02-08 ENCOUNTER — Other Ambulatory Visit: Payer: Self-pay | Admitting: *Deleted

## 2013-02-08 MED ORDER — RAMIPRIL 10 MG PO CAPS: 10.0000 mg | ORAL_CAPSULE | Freq: Every evening | ORAL | Status: DC

## 2013-02-08 MED ORDER — SPIRONOLACTONE 25 MG PO TABS: 25.0000 mg | ORAL_TABLET | Freq: Every evening | ORAL | Status: DC

## 2013-02-08 MED ORDER — METOPROLOL SUCCINATE ER 25 MG PO TB24: 25.0000 mg | ORAL_TABLET | Freq: Every day | ORAL | Status: DC

## 2013-02-08 MED ORDER — POTASSIUM CHLORIDE CRYS ER 20 MEQ PO TBCR: 20.0000 meq | EXTENDED_RELEASE_TABLET | Freq: Every day | ORAL | Status: DC

## 2013-02-08 MED ORDER — ATORVASTATIN CALCIUM 80 MG PO TABS: 80.0000 mg | ORAL_TABLET | Freq: Every day | ORAL | Status: DC

## 2013-02-22 ENCOUNTER — Other Ambulatory Visit: Payer: Self-pay | Admitting: *Deleted

## 2013-02-22 MED ORDER — OMEPRAZOLE 20 MG PO CPDR: 20.0000 mg | DELAYED_RELEASE_CAPSULE | Freq: Every day | ORAL | Status: DC

## 2013-03-14 ENCOUNTER — Other Ambulatory Visit: Payer: Medicare Other

## 2013-03-14 ENCOUNTER — Ambulatory Visit: Payer: Medicare Other | Admitting: Nurse Practitioner

## 2013-03-14 ENCOUNTER — Ambulatory Visit: Payer: Medicare Other | Admitting: Cardiology

## 2013-03-19 ENCOUNTER — Telehealth: Payer: Self-pay

## 2013-03-19 NOTE — Telephone Encounter (Signed)
Received clarification form from [facility] wanting to make sure ok to continue spironolactone and ramipril.Dr.Jordan advised to continue both,form faxed back to fax # [phone].

## 2013-04-02 ENCOUNTER — Other Ambulatory Visit: Payer: Medicare Other

## 2013-04-02 ENCOUNTER — Ambulatory Visit: Payer: Medicare Other | Admitting: Nurse Practitioner

## 2013-04-17 ENCOUNTER — Encounter: Payer: Self-pay | Admitting: *Deleted

## 2013-04-24 ENCOUNTER — Telehealth: Payer: Self-pay | Admitting: Internal Medicine

## 2013-04-24 ENCOUNTER — Encounter: Payer: Self-pay | Admitting: Internal Medicine

## 2013-04-24 NOTE — Telephone Encounter (Signed)
04-24-13 NUMBER INCORRECT, SENT PAST DUE LETTER, PT WAS DUE TO SEE KLEIN IN JAN 2015 FOR DEFIB CK/MT

## 2013-04-26 ENCOUNTER — Telehealth: Payer: Self-pay | Admitting: *Deleted

## 2013-04-26 NOTE — Telephone Encounter (Signed)
Patient requests effient samples. I will place at the front desk for pick up. 

## 2013-04-29 ENCOUNTER — Other Ambulatory Visit: Payer: Self-pay | Admitting: Cardiology

## 2013-05-10 ENCOUNTER — Other Ambulatory Visit: Payer: Self-pay | Admitting: Cardiology

## 2013-05-18 ENCOUNTER — Ambulatory Visit: Payer: Medicare Other | Admitting: Nurse Practitioner

## 2013-05-22 ENCOUNTER — Encounter: Payer: Self-pay | Admitting: Internal Medicine

## 2013-05-22 ENCOUNTER — Ambulatory Visit (INDEPENDENT_AMBULATORY_CARE_PROVIDER_SITE_OTHER): Payer: Commercial Managed Care - HMO | Admitting: Internal Medicine

## 2013-05-22 ENCOUNTER — Ambulatory Visit: Payer: Commercial Managed Care - HMO | Admitting: Nurse Practitioner

## 2013-05-22 VITALS — BP 130/82 | HR 67 | Ht 73.0 in | Wt 262.4 lb

## 2013-05-22 DIAGNOSIS — I5022 Chronic systolic (congestive) heart failure: Secondary | ICD-10-CM

## 2013-05-22 DIAGNOSIS — Z95810 Presence of automatic (implantable) cardiac defibrillator: Secondary | ICD-10-CM

## 2013-05-22 DIAGNOSIS — I509 Heart failure, unspecified: Secondary | ICD-10-CM

## 2013-05-22 DIAGNOSIS — I255 Ischemic cardiomyopathy: Secondary | ICD-10-CM

## 2013-05-22 DIAGNOSIS — I2589 Other forms of chronic ischemic heart disease: Secondary | ICD-10-CM

## 2013-05-22 LAB — MDC_IDC_ENUM_SESS_TYPE_INCLINIC
Battery Voltage: 3.09 V
Brady Statistic RV Percent Paced: 0.05 %
HIGH POWER IMPEDANCE MEASURED VALUE: 209 Ohm
HIGH POWER IMPEDANCE MEASURED VALUE: 399 Ohm
HighPow Impedance: 53 Ohm
HighPow Impedance: 74 Ohm
Lead Channel Impedance Value: 494 Ohm
Lead Channel Pacing Threshold Pulse Width: 0.4 ms
Lead Channel Sensing Intrinsic Amplitude: 12.125 mV
Lead Channel Setting Pacing Amplitude: 2.5 V
Lead Channel Setting Pacing Pulse Width: 0.4 ms
Lead Channel Setting Sensing Sensitivity: 0.3 mV
MDC IDC MSMT LEADCHNL RV PACING THRESHOLD AMPLITUDE: 0.75 V
MDC IDC MSMT LEADCHNL RV SENSING INTR AMPL: 10.25 mV
MDC IDC SESS DTM: 20150512151913
MDC IDC SET ZONE DETECTION INTERVAL: 360 ms
Zone Setting Detection Interval: 300 ms
Zone Setting Detection Interval: 360 ms

## 2013-05-22 LAB — BASIC METABOLIC PANEL
BUN: 15 mg/dL (ref 6–23)
CO2: 28 meq/L (ref 19–32)
Calcium: 9.3 mg/dL (ref 8.4–10.5)
Chloride: 103 mEq/L (ref 96–112)
Creatinine, Ser: 1 mg/dL (ref 0.4–1.5)
GFR: 89.34 mL/min (ref >60.00)
Glucose, Bld: 83 mg/dL (ref 70–99)
Potassium: 3.9 mEq/L (ref 3.5–5.1)
SODIUM: 138 meq/L (ref 135–145)

## 2013-05-22 NOTE — Progress Notes (Signed)
Patient has no care team.   HPI  Jonathan ScarletLarry Fischer is a 43 y.o. male Seen in followup for an ICD implanted for complex coronary disease. This was done in November 2011. He has a history of appropriate therapy for ventricular tachycardia with ICD discharge.  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 Stent in the LCX was patent. Stent in the mid RCA was patent. EF 25 to 30%. Troponin over 20. Echo shoed EF of 25 to 30% as well, septal apical and anterior wall AK and possible small layer of mural apical thrombus without mobility. Not felt to be a candidate for further anticoagulation due to his noncompliance.    Myoview 10/14 demonstrated ejection fraction 36% with a large anterior scar    He is currently taking Aldactone and Lasix when necessary.    The patient denies chest pain, shortness of breath, nocturnal dyspnea, orthopnea or peripheral edema.  There have been no palpitations, lightheadedness or syncope.     Past Medical History  Diagnosis Date  . CAD (coronary artery disease)     s/p prior LAD, LCx and RCA stenting remotely for an anterior MI, recurrent MI in 2010 with LAD stent occlusion s/p thrombectomy and PTCA, STEMI 12/2011 s/p DES-prox LAD  . Obesity   . Hyperlipidemia   . ICD (implantable cardiac defibrillator) in place   . Tobacco abuse   . Chronic systolic CHF (congestive heart failure)   . Ischemic cardiomyopathy     EF 25-30% s/p single-chamber Medtronic ICD   . HTN (hypertension)   . Abnormal echocardiogram     a. 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    Past Surgical History  Procedure Laterality Date  .  Cardiac catheterization  03/31/2009  . Cardiac catheterization  05/08/2008    CARDIAC SIZE AND SILHOUETTE NORMAL. THERE WAS ANTERIOR APICAL HYPOKINESIS WITH EF 35-40%  . Coronary angioplasty      BALLOON ANGIOPLASTY TO THE LAD WITH THROMBUS EXTRACTION OF THE PREVIOUSLY STENTED SEGMENT AND NEW STENT PLACEMENT TO THE LEFT CIRCUMFLEX. EF IS DOWN IN THE 30% RANGE  . Cardiac defibrillator placement  11/2009  . Coronary angioplasty with stent placement  12/12/2011    30% mid LCx, mid RCA & mid LCx stents patent. LVEF 25-30%, mid-distal anterolateral wall AK, apex dilated, dyskinetic s/p DES-prox LAD stenosis.    Current Outpatient Prescriptions  Medication Sig Dispense Refill  . aspirin EC 81 MG EC tablet Take 1 tablet (81 mg total) by mouth daily.  30 tablet  3  . atorvastatin (LIPITOR) 80 MG tablet TAKE 1 TABLET EVERY DAY  30 tablet  0  . Choline Fenofibrate (TRILIPIX) 135 MG capsule Take 1 capsule (135 mg total) by mouth every evening.  30 capsule  5  . metoprolol succinate (TOPROL-XL) 25 MG 24 hr tablet TAKE 1 TABLET EVERY DAY  30 tablet  0  . nitroGLYCERIN (NITROSTAT) 0.4 MG SL tablet Place 1 tablet (0.4 mg total) under the tongue every 5 (five) minutes as needed. For chest pain  25 tablet  3  . omeprazole (PRILOSEC) 20 MG capsule TAKE 1 CAPSULE EVERY DAY  90  capsule  0  . potassium chloride SA (K-DUR,KLOR-CON) 20 MEQ tablet TAKE 1 TABLET EVERY DAY  30 tablet  0  . prasugrel (EFFIENT) 10 MG TABS Take 10 mg by mouth daily.      . ramipril (ALTACE) 10 MG capsule TAKE 1 CAPSULE EVERY EVENING  90 capsule  0  . spironolactone (ALDACTONE) 25 MG tablet TAKE 1 TABLET EVERY EVENING  90 tablet  0   No current facility-administered medications for this visit.    No Known Allergies  Review of Systems negative except from HPI and PMH  Physical Exam BP 130/82  Pulse 67  Ht 6\' 1"  (1.854 m)  Wt 262 lb 6.4 oz (119.024 kg)  BMI 34.63 kg/m2 Well developed and well nourished in no acute distress HENT  normal E scleral and icterus clear Neck Supple JVP flat; carotids brisk and full Clear to ausculation  Regular rate and rhythm, no murmurs gallops or rub Soft with active bowel sounds No clubbing cyanosis none Edema Alert and oriented, grossly normal motor and sensory function Skin Warm and Dry    Assessment and  Plan  Ventricular tachycardia  No intercurrent Ventricular tachycardia  Ischemic cardiomyopathy stable on current meds Will check BMET on aldactone  Congestive heart failure chronic systolic continue current meds  TYobacco abuse__ still smoking  Not rady to pull the stopping trigger  Implantable defibrillator-Medtronic  The patient's device was interrogated.  The information was reviewed. No changes were made in the programming.

## 2013-05-22 NOTE — Patient Instructions (Signed)
Labs today: BMET  Your physician recommends that you continue on your current medications as directed. Please refer to the Current Medication list given to you today.  Your physician recommends that you schedule a follow-up appointment in: 3 months with device clinic.  Your physician wants you to follow-up in: 6 months with Dr. SwazilandJordan.  You will receive a reminder letter in the mail two months in advance. If you don't receive a letter, please call our office to schedule the follow-up appointment.  Your physician wants you to follow-up in: 1 year with Dr. Graciela HusbandsKlein.  You will receive a reminder letter in the mail two months in advance. If you don't receive a letter, please call our office to schedule the follow-up appointment.

## 2013-06-15 ENCOUNTER — Other Ambulatory Visit: Payer: Self-pay | Admitting: *Deleted

## 2013-06-15 MED ORDER — POTASSIUM CHLORIDE CRYS ER 20 MEQ PO TBCR: EXTENDED_RELEASE_TABLET | ORAL | Status: DC

## 2013-06-15 MED ORDER — METOPROLOL SUCCINATE ER 25 MG PO TB24: ORAL_TABLET | ORAL | Status: DC

## 2013-06-15 MED ORDER — ATORVASTATIN CALCIUM 80 MG PO TABS: ORAL_TABLET | ORAL | Status: DC

## 2013-09-15 ENCOUNTER — Emergency Department (HOSPITAL_COMMUNITY): Payer: Medicare HMO

## 2013-09-15 ENCOUNTER — Emergency Department (HOSPITAL_COMMUNITY)
Admission: EM | Admit: 2013-09-15 | Discharge: 2013-09-15 | Disposition: A | Discharge status: Home / Self Care | Payer: Medicare HMO | Attending: Emergency Medicine | Admitting: Emergency Medicine

## 2013-09-15 ENCOUNTER — Encounter (HOSPITAL_COMMUNITY): Payer: Self-pay | Admitting: Emergency Medicine

## 2013-09-15 DIAGNOSIS — F17200 Nicotine dependence, unspecified, uncomplicated: Secondary | ICD-10-CM | POA: Insufficient documentation

## 2013-09-15 DIAGNOSIS — Z9889 Other specified postprocedural states: Secondary | ICD-10-CM | POA: Insufficient documentation

## 2013-09-15 DIAGNOSIS — Z95810 Presence of automatic (implantable) cardiac defibrillator: Secondary | ICD-10-CM | POA: Insufficient documentation

## 2013-09-15 DIAGNOSIS — Z8673 Personal history of transient ischemic attack (TIA), and cerebral infarction without residual deficits: Secondary | ICD-10-CM | POA: Diagnosis not present

## 2013-09-15 DIAGNOSIS — Z7982 Long term (current) use of aspirin: Secondary | ICD-10-CM | POA: Insufficient documentation

## 2013-09-15 DIAGNOSIS — R4182 Altered mental status, unspecified: Secondary | ICD-10-CM | POA: Insufficient documentation

## 2013-09-15 DIAGNOSIS — E785 Hyperlipidemia, unspecified: Secondary | ICD-10-CM | POA: Diagnosis not present

## 2013-09-15 DIAGNOSIS — R0789 Other chest pain: Secondary | ICD-10-CM

## 2013-09-15 DIAGNOSIS — I2510 Atherosclerotic heart disease of native coronary artery without angina pectoris: Secondary | ICD-10-CM | POA: Diagnosis not present

## 2013-09-15 DIAGNOSIS — Z79899 Other long term (current) drug therapy: Secondary | ICD-10-CM | POA: Diagnosis not present

## 2013-09-15 DIAGNOSIS — I5022 Chronic systolic (congestive) heart failure: Secondary | ICD-10-CM | POA: Diagnosis not present

## 2013-09-15 DIAGNOSIS — E669 Obesity, unspecified: Secondary | ICD-10-CM | POA: Insufficient documentation

## 2013-09-15 DIAGNOSIS — R079 Chest pain, unspecified: Secondary | ICD-10-CM | POA: Insufficient documentation

## 2013-09-15 DIAGNOSIS — F1010 Alcohol abuse, uncomplicated: Secondary | ICD-10-CM | POA: Insufficient documentation

## 2013-09-15 DIAGNOSIS — Z951 Presence of aortocoronary bypass graft: Secondary | ICD-10-CM | POA: Insufficient documentation

## 2013-09-15 DIAGNOSIS — F10920 Alcohol use, unspecified with intoxication, uncomplicated: Secondary | ICD-10-CM

## 2013-09-15 DIAGNOSIS — I10 Essential (primary) hypertension: Secondary | ICD-10-CM | POA: Insufficient documentation

## 2013-09-15 LAB — CBC WITH DIFFERENTIAL/PLATELET
Basophils Absolute: 0 10*3/uL (ref 0.0–0.1)
Basophils Relative: 0 % (ref 0–1)
Eosinophils Absolute: 0 10*3/uL (ref 0.0–0.7)
Eosinophils Relative: 0 % (ref 0–5)
HCT: 46.1 % (ref 39.0–52.0)
Hemoglobin: 16.3 g/dL (ref 13.0–17.0)
LYMPHS ABS: 1.6 10*3/uL (ref 0.7–4.0)
Lymphocytes Relative: 15 % (ref 12–46)
MCH: 32.9 pg (ref 26.0–34.0)
MCHC: 35.4 g/dL (ref 30.0–36.0)
MCV: 93.1 fL (ref 78.0–100.0)
Monocytes Absolute: 0.4 10*3/uL (ref 0.1–1.0)
Monocytes Relative: 4 % (ref 3–12)
NEUTROS PCT: 81 % — AB (ref 43–77)
Neutro Abs: 8.9 10*3/uL — ABNORMAL HIGH (ref 1.7–7.7)
Platelets: 226 10*3/uL (ref 150–400)
RBC: 4.95 MIL/uL (ref 4.22–5.81)
RDW: 12.8 % (ref 11.5–15.5)
WBC: 11 10*3/uL — ABNORMAL HIGH (ref 4.0–10.5)

## 2013-09-15 LAB — RAPID URINE DRUG SCREEN, HOSP PERFORMED
Amphetamines: NOT DETECTED
BARBITURATES: NOT DETECTED
Benzodiazepines: NOT DETECTED
Cocaine: NOT DETECTED
OPIATES: NOT DETECTED
Tetrahydrocannabinol: NOT DETECTED

## 2013-09-15 LAB — I-STAT TROPONIN, ED: Troponin i, poc: 0 ng/mL (ref 0.00–0.08)

## 2013-09-15 LAB — COMPREHENSIVE METABOLIC PANEL
ALK PHOS: 80 U/L (ref 39–117)
ALT: 60 U/L — ABNORMAL HIGH (ref 0–53)
AST: 33 U/L (ref 0–37)
Albumin: 3.9 g/dL (ref 3.5–5.2)
Anion gap: 14 (ref 5–15)
BILIRUBIN TOTAL: 0.2 mg/dL — AB (ref 0.3–1.2)
BUN: 12 mg/dL (ref 6–23)
CO2: 25 mEq/L (ref 19–32)
Calcium: 8.7 mg/dL (ref 8.4–10.5)
Chloride: 100 mEq/L (ref 96–112)
Creatinine, Ser: 0.96 mg/dL (ref 0.50–1.35)
GFR calc non Af Amer: >90 mL/min (ref >90)
Glucose, Bld: 148 mg/dL — ABNORMAL HIGH (ref 70–99)
POTASSIUM: 4.2 meq/L (ref 3.7–5.3)
Sodium: 139 mEq/L (ref 137–147)
TOTAL PROTEIN: 7.9 g/dL (ref 6.0–8.3)

## 2013-09-15 LAB — LIPASE, BLOOD: LIPASE: 49 U/L (ref 11–59)

## 2013-09-15 LAB — ETHANOL: Alcohol, Ethyl (B): 152 mg/dL — ABNORMAL HIGH (ref 0–11)

## 2013-09-15 LAB — TROPONIN I: Troponin I: <0.3 ng/mL (ref <0.30)

## 2013-09-15 MED ORDER — ASPIRIN 81 MG PO CHEW
324.0000 mg | CHEWABLE_TABLET | Freq: Once | ORAL | Status: AC
  Administered 2013-09-15: 324 mg via ORAL
  Filled 2013-09-15: qty 4

## 2013-09-15 MED ORDER — ONDANSETRON HCL 4 MG/2ML IJ SOLN: 4.0000 mg | Freq: Once | INTRAMUSCULAR | Status: DC

## 2013-09-15 MED ORDER — NITROGLYCERIN 2 % TD OINT
1.0000 [in_us] | TOPICAL_OINTMENT | Freq: Once | TRANSDERMAL | Status: DC
  Filled 2013-09-15 (×2): qty 1

## 2013-09-15 MED ORDER — PANTOPRAZOLE SODIUM 40 MG IV SOLR
40.0000 mg | Freq: Once | INTRAVENOUS | Status: AC
  Administered 2013-09-15: 40 mg via INTRAVENOUS
  Filled 2013-09-15: qty 40

## 2013-09-15 MED ORDER — ONDANSETRON HCL 4 MG/2ML IJ SOLN
4.0000 mg | Freq: Once | INTRAMUSCULAR | Status: AC
  Administered 2013-09-15: 4 mg via INTRAVENOUS
  Filled 2013-09-15: qty 2

## 2013-09-15 NOTE — ED Provider Notes (Signed)
I am reevaluating the patient for chief complaint of chest pain and acute alcohol intoxication from earlier in the evening. The patient had been drinking a significant amount which is atypical for him by report. He apparently became ill at that time and vomited fairly forcefully a couple of times. It was at that point in time that he developed a central chest pain that has been constant in nature since onset. It did not have associated symptoms. The patient has had a significant cardiac history in the past they were concerned and came to the emergency department. The patient has continued to have chest pain at this point in time I am reassessing him for determining further workup. 2 sets of cardiac enzymes have been negative.  Cardiology was consult and for the patient with Dr. Jerral Bonito. At this point in time with negative enzymes and a chest pain that has resolved with treatment for her GI symptoms we degree the patient was safe for discharge and continued outpatient management.  At this point in time upon reassessment the patient has no chest pain has gotten good relief with a combination of Protonix and Zofran. He is he has eaten and had fluids without difficulty.  Arby Barrette, MD 09/15/13 1256

## 2013-09-15 NOTE — ED Notes (Signed)
Pt aware still need urine pt states unable to urinate at this time pt drinking fluids

## 2013-09-15 NOTE — ED Notes (Signed)
Pt from home with wife. Per EMS, pt was drinking tonight, and had a sudden onset of chest pain, unsure of what time. Pt's wife states that she is unsure of how much the pt had to drink, but he was drinking whiskey. Pt"s wife states that she had sat him down and he started vomiting, at which point the chest pain started. Pt with 6 previous MIs, and has an AICD in place. Pt received 1 NTG en route, but EMS unsure if the medication helped, as the pt fell asleep, and is not easily roused.

## 2013-09-15 NOTE — Discharge Instructions (Signed)

## 2013-09-15 NOTE — ED Provider Notes (Signed)
CSN: [id number]     Arrival date & time 09/15/13  0316 History   First MD Initiated Contact with Patient 09/15/13 0319     Chief Complaint  Patient presents with  . Chest Pain  . Alcohol Intoxication     (Consider location/radiation/quality/duration/timing/severity/associated sxs/prior Treatment) HPI Patient presents with chest pain after drinking heavily this evening. Wife states the patient drinks roughly 1-1/2 fifths of whiskey. He began having central chest pain. States it did not radiate. He took nitroglycerin with improvement of the pain. He is currently very drowsy but arouses with stimulation. Briefly answer simple questions. Denies shortness of breath. He was given an aspirin by his wife but states he spit it out. Patient has a history of coronary artery disease and stenting. Also has a history of congestive heart failure and ischemic cardiomyopathy. He has a defibrillator in place. Past Medical History  Diagnosis Date  . CAD (coronary artery disease)     s/p prior LAD, LCx and RCA stenting remotely for an anterior MI, recurrent MI in 2010 with LAD stent occlusion s/p thrombectomy and PTCA, STEMI 12/2011 s/p DES-prox LAD  . Obesity   . Hyperlipidemia   . ICD (implantable cardiac defibrillator) in place   . Tobacco abuse   . Chronic systolic CHF (congestive heart failure)   . Ischemic cardiomyopathy     EF 25-30% s/p single-chamber Medtronic ICD   . HTN (hypertension)   . Abnormal echocardiogram     a. 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   Past Surgical History  Procedure Laterality Date  . Cardiac catheterization  03/31/2009  . Cardiac catheterization  05/08/2008    CARDIAC SIZE AND SILHOUETTE NORMAL. THERE WAS ANTERIOR APICAL HYPOKINESIS WITH EF 35-40%  . Coronary angioplasty      BALLOON ANGIOPLASTY TO THE LAD WITH THROMBUS EXTRACTION OF THE PREVIOUSLY STENTED  SEGMENT AND NEW STENT PLACEMENT TO THE LEFT CIRCUMFLEX. EF IS DOWN IN THE 30% RANGE  . Cardiac defibrillator placement  11/2009  . Coronary angioplasty with stent placement  12/12/2011    30% mid LCx, mid RCA & mid LCx stents patent. LVEF 25-30%, mid-distal anterolateral wall AK, apex dilated, dyskinetic s/p DES-prox LAD stenosis.   Family History  Problem Relation Age of Onset  . Cancer Mother   . Heart disease Father    History  Substance Use Topics  . Smoking status: Current Every Day Smoker -- 1.50 packs/day for 24 years    Types: Cigarettes  . Smokeless tobacco: Never Used  . Alcohol Use: Yes     Comment: Occassionally.    Review of Systems  Unable to perform ROS: Mental status change  Respiratory: Negative for shortness of breath.   Cardiovascular: Positive for chest pain.  Gastrointestinal: Negative for nausea, vomiting and abdominal pain.      Allergies  Review of patient's allergies indicates no known allergies.  Home Medications   Prior to Admission medications   Medication Sig Start Date End Date Taking? Authorizing Provider  aspirin EC 81 MG EC tablet Take 1 tablet (81 mg total) by mouth daily. 12/14/11   Roger A Arguello, PA-C  atorvastatin (LIPITOR) 80 MG tablet TAKE 1 TABLET EVERY DAY 06/15/13   Peter M [place]azil[place], MD  Choline Fenofibrate (TRILIPIX) 135 MG capsule Take 1 capsule (135 mg total) by mouth every evening. 12/15/11   Duke Salvia, MD  metoprolol succinate (TOPROL-XL) 25 MG 24  hr tablet TAKE 1 TABLET EVERY DAY 06/15/13   Peter M [place], MD  nitroGLYCERIN (NITROSTAT) 0.4 MG SL tablet Place 1 tablet (0.4 mg total) under the tongue every 5 (five) minutes as needed. For chest pain 12/15/11   Duke Salvia, MD  omeprazole (PRILOSEC) 20 MG capsule TAKE 1 CAPSULE EVERY DAY 05/10/13   Peter M [place], MD  potassium chloride SA (K-DUR,KLOR-CON) 20 MEQ tablet TAKE 1 TABLET EVERY DAY 06/15/13   Peter M [place], MD  prasugrel (EFFIENT) 10 MG TABS Take 10 mg by mouth daily.     Historical Provider, MD  ramipril (ALTACE) 10 MG capsule TAKE 1 CAPSULE EVERY EVENING 05/10/13   Peter M [place], MD  spironolactone (ALDACTONE) 25 MG tablet TAKE 1 TABLET EVERY EVENING 05/10/13   Peter M [place], MD   BP 111/59  Temp(Src) 97.6 F (36.4 C) (Oral)  Resp 21  SpO2 92% Physical Exam  Nursing note and vitals reviewed. Constitutional: He appears well-developed and well-nourished. No distress.  Sleeping  HENT:  Head: Normocephalic and atraumatic.  Mouth/Throat: Oropharynx is clear and moist.  Eyes: EOM are normal. Pupils are equal, round, and reactive to light.  Neck: Normal range of motion. Neck supple.  Cardiovascular: Normal rate and regular rhythm.  Exam reveals no gallop and no friction rub.   No murmur heard. Pulmonary/Chest: Effort normal and breath sounds normal. No respiratory distress. He has no wheezes. He has no rales. He exhibits no tenderness.  Abdominal: Soft. Bowel sounds are normal. He exhibits no distension and no mass. There is no tenderness. There is no rebound and no guarding.  Musculoskeletal: Normal range of motion. He exhibits no edema and no tenderness.  Neurological:  Awakes with stimulation. Appears to move all extremities. Limited neuro exam due to altered mental status.  Skin: Skin is warm and dry. No rash noted. No erythema.    ED Course  Procedures (including critical care time) Labs Review Labs Reviewed  CBC WITH DIFFERENTIAL  COMPREHENSIVE METABOLIC PANEL  TROPONIN I  ETHANOL  URINE RAPID DRUG SCREEN (HOSP PERFORMED)    Imaging Review No results found.   EKG Interpretation None      Date: 09/15/2013  Rate: 67  Rhythm: normal sinus rhythm  QRS Axis: normal  Intervals: normal  ST/T Wave abnormalities: normal  Conduction Disutrbances:none  Narrative Interpretation:   Old EKG Reviewed: unchanged   MDM   Final diagnoses:  None   Patient is a very drowsy. Initial troponin is normal. Nitroglycerin paste applied to chest.  Will repeat a troponin at 7:30 and reevaluate when more sober. Signed out to oncoming emergency physician     Loren Racer, MD 09/17/13 [phone]

## 2013-09-15 NOTE — ED Notes (Signed)
Pt's wife states that the pt would not chew ASA at home, and the pt told Dr. Ranae Palms that he would not chew ASA at this time.

## 2013-10-22 ENCOUNTER — Other Ambulatory Visit: Payer: Self-pay

## 2013-10-22 MED ORDER — POTASSIUM CHLORIDE CRYS ER 20 MEQ PO TBCR: 20.0000 meq | EXTENDED_RELEASE_TABLET | Freq: Every day | ORAL | Status: DC

## 2013-10-22 MED ORDER — ATORVASTATIN CALCIUM 80 MG PO TABS: 80.0000 mg | ORAL_TABLET | Freq: Every day | ORAL | Status: DC

## 2013-10-22 MED ORDER — METOPROLOL SUCCINATE ER 25 MG PO TB24: 25.0000 mg | ORAL_TABLET | Freq: Every day | ORAL | Status: DC

## 2013-12-19 ENCOUNTER — Ambulatory Visit (INDEPENDENT_AMBULATORY_CARE_PROVIDER_SITE_OTHER): Payer: Medicare HMO | Admitting: *Deleted

## 2013-12-19 DIAGNOSIS — I255 Ischemic cardiomyopathy: Secondary | ICD-10-CM

## 2013-12-19 NOTE — Progress Notes (Signed)
Remote ICD transmission.   

## 2013-12-20 ENCOUNTER — Encounter (HOSPITAL_COMMUNITY): Payer: Self-pay | Admitting: Cardiology

## 2013-12-22 LAB — MDC_IDC_ENUM_SESS_TYPE_REMOTE
Battery Voltage: 3.09 V
Brady Statistic RV Percent Paced: 0.01 %
Date Time Interrogation Session: 20151209170250
HIGH POWER IMPEDANCE MEASURED VALUE: 380 Ohm
HIGH POWER IMPEDANCE MEASURED VALUE: 47 Ohm
HighPow Impedance: 63 Ohm
Lead Channel Sensing Intrinsic Amplitude: 13.875 mV
Lead Channel Setting Sensing Sensitivity: 0.3 mV
MDC IDC MSMT LEADCHNL RV IMPEDANCE VALUE: 456 Ohm
MDC IDC SET LEADCHNL RV PACING AMPLITUDE: 2.5 V
MDC IDC SET LEADCHNL RV PACING PULSEWIDTH: 0.4 ms
MDC IDC SET ZONE DETECTION INTERVAL: 300 ms
Zone Setting Detection Interval: 360 ms
Zone Setting Detection Interval: 360 ms

## 2013-12-31 ENCOUNTER — Telehealth: Payer: Self-pay | Admitting: *Deleted

## 2013-12-31 NOTE — Telephone Encounter (Signed)
Effient samples placed at the front desk for patient. 

## 2014-01-17 ENCOUNTER — Encounter: Payer: Self-pay | Admitting: Cardiology

## 2014-01-23 ENCOUNTER — Encounter: Payer: Self-pay | Admitting: Internal Medicine

## 2014-03-08 ENCOUNTER — Other Ambulatory Visit: Payer: Self-pay | Admitting: *Deleted

## 2014-03-08 MED ORDER — NITROGLYCERIN 0.4 MG SL SUBL: 0.4000 mg | SUBLINGUAL_TABLET | SUBLINGUAL | Status: DC | PRN

## 2014-03-21 ENCOUNTER — Encounter: Payer: Medicare HMO | Admitting: *Deleted

## 2014-03-21 ENCOUNTER — Telehealth: Payer: Self-pay | Admitting: Cardiology

## 2014-03-21 NOTE — Telephone Encounter (Signed)
LMOVM reminding pt to send remote transmission.   

## 2014-03-22 ENCOUNTER — Encounter: Payer: Self-pay | Admitting: Cardiology

## 2014-06-14 ENCOUNTER — Encounter: Payer: Self-pay | Admitting: *Deleted

## 2014-07-12 ENCOUNTER — Encounter: Payer: Self-pay | Admitting: *Deleted

## 2014-08-06 ENCOUNTER — Encounter: Payer: Self-pay | Admitting: *Deleted

## 2014-09-02 ENCOUNTER — Ambulatory Visit (INDEPENDENT_AMBULATORY_CARE_PROVIDER_SITE_OTHER): Payer: Commercial Managed Care - HMO | Admitting: *Deleted

## 2014-09-02 DIAGNOSIS — I255 Ischemic cardiomyopathy: Secondary | ICD-10-CM | POA: Diagnosis not present

## 2014-09-03 NOTE — Progress Notes (Signed)
Remote ICD transmission.   

## 2014-09-06 ENCOUNTER — Encounter: Payer: Self-pay | Admitting: *Deleted

## 2014-09-06 LAB — CUP PACEART REMOTE DEVICE CHECK
Date Time Interrogation Session: 20160823031314
HighPow Impedance: 380 Ohm
HighPow Impedance: 46 Ohm
HighPow Impedance: 63 Ohm
Lead Channel Sensing Intrinsic Amplitude: 15.875 mV
Lead Channel Sensing Intrinsic Amplitude: 15.875 mV
Lead Channel Setting Pacing Pulse Width: 0.4 ms
Lead Channel Setting Sensing Sensitivity: 0.3 mV
MDC IDC MSMT BATTERY VOLTAGE: 3.07 V
MDC IDC MSMT LEADCHNL RV IMPEDANCE VALUE: 456 Ohm
MDC IDC SET LEADCHNL RV PACING AMPLITUDE: 2.5 V
MDC IDC SET ZONE DETECTION INTERVAL: 360 ms
MDC IDC STAT BRADY RV PERCENT PACED: 0.01 %
Zone Setting Detection Interval: 300 ms
Zone Setting Detection Interval: 360 ms

## 2014-09-26 ENCOUNTER — Encounter: Payer: Self-pay | Admitting: Internal Medicine

## 2014-12-03 ENCOUNTER — Telehealth: Payer: Self-pay | Admitting: Internal Medicine

## 2014-12-03 NOTE — Telephone Encounter (Signed)
Patient calling the office for samples of medication:   1.  What medication and dosage are you requesting samples for? Effient  2.  Are you currently out of this medication? Yes, trying to pick some up from our office tomorrow

## 2014-12-04 NOTE — Telephone Encounter (Signed)
I called pt and left message informing pt that we do not have any samples of the Effient 10 mg tablets and if he wanted to give our office a call next week, to see if we have gotten any samples in and if he has any other problems, questions or concerns to call the office.

## 2014-12-09 ENCOUNTER — Telehealth: Payer: Self-pay | Admitting: Internal Medicine

## 2014-12-09 ENCOUNTER — Other Ambulatory Visit: Payer: Self-pay | Admitting: Cardiology

## 2014-12-09 NOTE — Telephone Encounter (Signed)
New Message   *STAT* If patient is at the pharmacy, call can be transferred to refill team.   1. Which medications need to be refilled? (please list name of each medication and dose if known):SAMPLES for prasugrel (EFFIENT) 10 MG TABS

## 2014-12-10 ENCOUNTER — Other Ambulatory Visit: Payer: Self-pay | Admitting: *Deleted

## 2014-12-10 MED ORDER — PRASUGREL HCL 10 MG PO TABS: 10.0000 mg | ORAL_TABLET | Freq: Every day | ORAL | Status: DC

## 2014-12-10 NOTE — Telephone Encounter (Signed)
called pt to inform him we have no refills, LVM to that effect.

## 2016-02-21 ENCOUNTER — Emergency Department (HOSPITAL_BASED_OUTPATIENT_CLINIC_OR_DEPARTMENT_OTHER)
Admission: EM | Admit: 2016-02-21 | Discharge: 2016-02-21 | Disposition: A | Discharge status: Home / Self Care | Payer: Medicare HMO | Attending: Emergency Medicine | Admitting: Emergency Medicine

## 2016-02-21 ENCOUNTER — Encounter (HOSPITAL_BASED_OUTPATIENT_CLINIC_OR_DEPARTMENT_OTHER): Payer: Self-pay

## 2016-02-21 DIAGNOSIS — Z95810 Presence of automatic (implantable) cardiac defibrillator: Secondary | ICD-10-CM | POA: Insufficient documentation

## 2016-02-21 DIAGNOSIS — Z7982 Long term (current) use of aspirin: Secondary | ICD-10-CM | POA: Insufficient documentation

## 2016-02-21 DIAGNOSIS — I110 Hypertensive heart disease with heart failure: Secondary | ICD-10-CM | POA: Diagnosis not present

## 2016-02-21 DIAGNOSIS — L02211 Cutaneous abscess of abdominal wall: Secondary | ICD-10-CM | POA: Diagnosis not present

## 2016-02-21 DIAGNOSIS — I2510 Atherosclerotic heart disease of native coronary artery without angina pectoris: Secondary | ICD-10-CM | POA: Insufficient documentation

## 2016-02-21 DIAGNOSIS — I509 Heart failure, unspecified: Secondary | ICD-10-CM | POA: Diagnosis not present

## 2016-02-21 DIAGNOSIS — F17210 Nicotine dependence, cigarettes, uncomplicated: Secondary | ICD-10-CM | POA: Diagnosis not present

## 2016-02-21 DIAGNOSIS — Z79899 Other long term (current) drug therapy: Secondary | ICD-10-CM | POA: Insufficient documentation

## 2016-02-21 MED ORDER — HYDROCODONE-ACETAMINOPHEN 5-325 MG PO TABS
1.0000 | ORAL_TABLET | Freq: Once | ORAL | Status: AC
  Administered 2016-02-21: 1 via ORAL
  Filled 2016-02-21: qty 1

## 2016-02-21 MED ORDER — LIDOCAINE HCL 2 % IJ SOLN: 5.0000 mL | Freq: Once | INTRAMUSCULAR | Status: DC

## 2016-02-21 MED ORDER — LIDOCAINE HCL (PF) 2 % IJ SOLN
INTRAMUSCULAR | Status: AC
  Administered 2016-02-21: 5 mL via INTRADERMAL
  Filled 2016-02-21: qty 2

## 2016-02-21 NOTE — ED Triage Notes (Signed)
Pt c/o abscess to rt lower abdomin x1wk, no drainage noted, states hx of same

## 2016-02-21 NOTE — ED Provider Notes (Signed)
[facility] Provider Note: Lowella DellJ. Lane Neela Zecca, MD, FACEP  CSN: [id number] MRN: [id number] ARRIVAL: 02/21/16 at 0248 ROOM: [facility]   CHIEF COMPLAINT  Abscess   HISTORY OF PRESENT ILLNESS  Jonathan Fischer is a 46 y.o. male with a history of a cardiomyopathy. He is here with a one-week history of a tender swollen area of his right lower abdomen at the belt line. Symptoms are consistent with previous abscesses. There is severe pain and tenderness at the location was some surrounding erythema. Pain is worse with movement or wearing a belt. There is been no spontaneous drainage.   Past Medical History:  Diagnosis Date  . Abnormal echocardiogram    a. 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  [name]. CAD (coronary artery disease)    s/p prior LAD, LCx and RCA stenting remotely for an anterior MI, recurrent MI in 2010 with LAD stent occlusion s/p thrombectomy and PTCA, STEMI 12/2011 s/p DES-prox LAD  . Chronic systolic CHF (congestive heart failure) (HCC)   . HTN (hypertension)   . Hyperlipidemia   . ICD (implantable cardiac defibrillator) in place   . Ischemic cardiomyopathy    EF 25-30% s/p single-chamber Medtronic ICD   . Obesity   . Tobacco abuse     Past Surgical History:  Procedure Laterality Date  . CARDIAC CATHETERIZATION  03/31/2009  . CARDIAC CATHETERIZATION  05/08/2008   CARDIAC SIZE AND SILHOUETTE NORMAL. THERE WAS ANTERIOR APICAL HYPOKINESIS WITH EF 35-40%  . CARDIAC DEFIBRILLATOR PLACEMENT  11/2009  . CORONARY ANGIOPLASTY     BALLOON ANGIOPLASTY TO THE LAD WITH THROMBUS EXTRACTION OF THE PREVIOUSLY STENTED SEGMENT AND NEW STENT PLACEMENT TO THE LEFT CIRCUMFLEX. EF IS DOWN IN THE 30% RANGE  . CORONARY ANGIOPLASTY WITH STENT PLACEMENT  12/12/2011   30% mid LCx, mid RCA & mid LCx stents patent. LVEF 25-30%, mid-distal anterolateral wall AK, apex dilated,  dyskinetic s/p DES-prox LAD stenosis.  [name]. defib    . LEFT HEART CATH Bilateral 12/12/2011   Procedure: LEFT HEART CATH;  Surgeon: Peter M SwazilandJordan, MD;  Location: [facility] CATH LAB;  Service: Cardiovascular;  Laterality: Bilateral;    Family History  Problem Relation Age of Onset  . Heart disease Father   . Cancer Mother     Social History  Substance Use Topics  . Smoking status: Current Every Day Smoker    Packs/day: 1.50    Years: 24.00    Types: E-cigarettes  . Smokeless tobacco: Current User  . Alcohol use Yes     Comment: Occassionally.    Prior to Admission medications   Medication Sig Start Date End Date Taking? Authorizing Provider  aspirin EC 81 MG EC tablet Take 1 tablet (81 mg total) by mouth daily. 12/14/11   Roger A Arguello, PA-C  atorvastatin (LIPITOR) 80 MG tablet TAKE 1 TABLET EVERY DAY APPOINTMENT IS NEEDED  FOR  FURTHER  REFILLS 12/09/14   Jake BatheMark C Skains, MD  metoprolol succinate (TOPROL-XL) 25 MG 24 hr tablet TAKE 1 TABLET EVERY DAY. NEED TO SCHEDULE APPOINTMENT WITH DR. SwazilandJORDAN FOR FUTURE REFILLS 12/09/14   Peter M SwazilandJordan, MD  nitroGLYCERIN (NITROSTAT) 0.4 MG SL tablet Place 1 tablet (0.4 mg total) under the tongue every 5 (five) minutes as needed. For chest pain 03/08/14   Duke SalviaSteven C Klein, MD  omeprazole (PRILOSEC) 20 MG capsule Take 20 mg by mouth daily.    Historical  Provider, MD  potassium chloride SA (K-DUR,KLOR-CON) 20 MEQ tablet Take 1 tablet (20 mEq total) by mouth daily. 10/22/13   Peter M [place], MD  prasugrel (EFFIENT) 10 MG TABS tablet Take 1 tablet (10 mg total) by mouth daily. 12/10/14   Peter M [place], MD  ramipril (ALTACE) 10 MG capsule Take 10 mg by mouth daily.    Historical Provider, MD  spironolactone (ALDACTONE) 25 MG tablet Take 25 mg by mouth daily.    Historical Provider, MD    Allergies Patient has no known allergies.   REVIEW OF SYSTEMS  Negative except as noted here or in the History of Present Illness.   PHYSICAL EXAMINATION  Initial  Vital Signs Blood pressure 132/75, pulse 80, temperature 98.7 F (37.1 C), temperature source Oral, resp. rate 18, height 6\' 1"  (1.854 m), weight 260 lb (117.9 kg), SpO2 96 %.  Examination General: Well-developed, well-nourished male in no acute distress; appearance consistent with age of record HENT: normocephalic; atraumatic Eyes: pupils equal, round and reactive to light; extraocular muscles intact Neck: supple Heart: regular rate and rhythm Lungs: clear to auscultation bilaterally Abdomen: soft; nondistended; bowel sounds present; tender fluctuant area at right beltline consistent with abscess Extremities: No deformity; full range of motion; pulses normal Neurologic: Awake, alert and oriented; motor function intact in all extremities and symmetric; no facial droop Skin: Warm and dry Psychiatric: Normal mood and affect   RESULTS  Summary of this visit's results, reviewed by myself:   EKG Interpretation  Date/Time:    Ventricular Rate:    PR Interval:    QRS Duration:   QT Interval:    QTC Calculation:   R Axis:     Text Interpretation:        Laboratory Studies: No results found for this or any previous visit (from the past 24 hour(s)). Imaging Studies: No results found.  ED COURSE  Nursing notes and initial vitals signs, including pulse oximetry, reviewed.  Vitals:   02/21/16 0301 02/21/16 0303  BP: 132/75   Pulse: 80   Resp: 18   Temp: 98.7 F (37.1 C)   TempSrc: Oral   SpO2: 96%   Weight:  260 lb (117.9 kg)  Height:  6\' 1"  (1.854 m)    PROCEDURES   INCISION AND DRAINAGE Performed by: Paula Libra L Consent: Verbal consent obtained. Risks and benefits: risks, benefits and alternatives were discussed Type: abscess  Body area: Right lower abdomen at beltline  Anesthesia: local infiltration  Incision was made with a scalpel.  Local anesthetic: lidocaine 2 % without epinephrine  Anesthetic total: 3 ml  Complexity: complex Blunt dissection to  break up loculations  Drainage: purulent, foul-smelling   Drainage amount: Copious   Packing material: 1/4 in iodoform gauze  Patient tolerance: Patient tolerated the procedure well with no immediate complications.  Patient was advised to return to ED if symptoms worsen over the next 3 days. If symptoms improve he was advised to remove the packing himself in 3 days.   ED DIAGNOSES     ICD-9-CM ICD-10-CM   1. Abscess of abdominal wall 682.2 L02.211        Paula Libra, MD 02/21/16 [phone]

## 2016-06-01 NOTE — Progress Notes (Deleted)
Cardiology Office Note    Date:  06/01/2016   ID:  Jonathan Fischer, DOB 10-29-1970, MRN [id number]  PCP:  Patient, No Pcp Per  Cardiologist: Dr. Swaziland Electrophysiologist: Dr. Graciela Husbands   No chief complaint on file.   History of Present Illness:    Jonathan Fischer is a 46 y.o. male with past medical history of CAD (s/p prior stenenting of LAD, LCx, and RCA, thrombectomy of LAD in 2010, and DES to prox-LAD in 2013), ischemic cardiomyopathy (s/p ICD placement in 2011), chronic systolic CHF, tobacco use, HTN, and HLD who presents to the office today for ***  He was last examined by Dr. Swaziland in 12/2012 and reported doing well from a cardiac perspective. His last device interrogation was in 08/2014 and showed one ventricular arrhythmia recorded x 3 sec @ 184bpm with appropriate sensing. He has not followed up with Cardiology since.     Past Medical History:  Diagnosis Date  . Abnormal echocardiogram    a. 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  [name] CAD (coronary artery disease)    s/p prior LAD, LCx and RCA stenting remotely for an anterior MI, recurrent MI in 2010 with LAD stent occlusion s/p thrombectomy and PTCA, STEMI 12/2011 s/p DES-prox LAD  . Chronic systolic CHF (congestive heart failure) (HCC)   . HTN (hypertension)   . Hyperlipidemia   . ICD (implantable cardiac defibrillator) in place   . Ischemic cardiomyopathy    EF 25-30% s/p single-chamber Medtronic ICD   . Obesity   . Tobacco abuse     Past Surgical History:  Procedure Laterality Date  . CARDIAC CATHETERIZATION  03/31/2009  . CARDIAC CATHETERIZATION  05/08/2008   CARDIAC SIZE AND SILHOUETTE NORMAL. THERE WAS ANTERIOR APICAL HYPOKINESIS WITH EF 35-40%  . CARDIAC DEFIBRILLATOR PLACEMENT  11/2009  . CORONARY ANGIOPLASTY     BALLOON ANGIOPLASTY TO THE LAD WITH THROMBUS EXTRACTION OF THE PREVIOUSLY  STENTED SEGMENT AND NEW STENT PLACEMENT TO THE LEFT CIRCUMFLEX. EF IS DOWN IN THE 30% RANGE  . CORONARY ANGIOPLASTY WITH STENT PLACEMENT  12/12/2011   30% mid LCx, mid RCA & mid LCx stents patent. LVEF 25-30%, mid-distal anterolateral wall AK, apex dilated, dyskinetic s/p DES-prox LAD stenosis.  [name] defib    . LEFT HEART CATH Bilateral 12/12/2011   Procedure: LEFT HEART CATH;  Surgeon: Peter M Swaziland, MD;  Location: [facility] CATH LAB;  Service: Cardiovascular;  Laterality: Bilateral;    Current Medications: Outpatient Medications Prior to Visit  Medication Sig Dispense Refill  . aspirin EC 81 MG EC tablet Take 1 tablet (81 mg total) by mouth daily. 30 tablet 3  . atorvastatin (LIPITOR) 80 MG tablet TAKE 1 TABLET EVERY DAY APPOINTMENT IS NEEDED  FOR  FURTHER  REFILLS 30 tablet 0  . metoprolol succinate (TOPROL-XL) 25 MG 24 hr tablet TAKE 1 TABLET EVERY DAY. NEED TO SCHEDULE APPOINTMENT WITH DR. Swaziland FOR FUTURE REFILLS 30 tablet 0  . nitroGLYCERIN (NITROSTAT) 0.4 MG SL tablet Place 1 tablet (0.4 mg total) under the tongue every 5 (five) minutes as needed. For chest pain 25 tablet 0  . omeprazole (PRILOSEC) 20 MG capsule Take 20 mg by mouth daily.    . potassium chloride SA (K-DUR,KLOR-CON) 20 MEQ tablet Take 1 tablet (20 mEq total) by mouth daily. 90 tablet 0  . prasugrel (EFFIENT) 10 MG TABS tablet Take 1 tablet (10 mg  total) by mouth daily. 30 tablet 0  . ramipril (ALTACE) 10 MG capsule Take 10 mg by mouth daily.    [name] spironolactone (ALDACTONE) 25 MG tablet Take 25 mg by mouth daily.     No facility-administered medications prior to visit.      Allergies:   Patient has no known allergies.   Social History   Social History  . Marital status: Married    Spouse name: N/A  . Number of children: N/A  . Years of education: N/A   Social History Main Topics  . Smoking status: Current Every Day Smoker    Packs/day: 1.50    Years: 24.00    Types: E-cigarettes  . Smokeless tobacco: Current User    . Alcohol use Yes     Comment: Occassionally.  . Drug use: No  . Sexual activity: Yes   Other Topics Concern  . Not on file   Social History Narrative  . No narrative on file     Family History:  The patient's ***family history includes Cancer in his mother; Heart disease in his father.   Review of Systems:   Please see the history of present illness.     General:  No chills, fever, night sweats or weight changes.  Cardiovascular:  No chest pain, dyspnea on exertion, edema, orthopnea, palpitations, paroxysmal nocturnal dyspnea. Dermatological: No rash, lesions/masses Respiratory: No cough, dyspnea Urologic: No hematuria, dysuria Abdominal:   No nausea, vomiting, diarrhea, bright red blood per rectum, melena, or hematemesis Neurologic:  No visual changes, wkns, changes in mental status. All other systems reviewed and are otherwise negative except as noted above.   Physical Exam:    VS:  There were no vitals taken for this visit.   General: Well developed, well nourished,male appearing in no acute distress. Head: Normocephalic, atraumatic, sclera non-icteric, no xanthomas, nares are without discharge.  Neck: No carotid bruits. JVD not elevated.  Lungs: Respirations regular and unlabored, without wheezes or rales.  Heart: ***Regular rate and rhythm. No S3 or S4.  No murmur, no rubs, or gallops appreciated. Abdomen: Soft, non-tender, non-distended with normoactive bowel sounds. No hepatomegaly. No rebound/guarding. No obvious abdominal masses. Msk:  Strength and tone appear normal for age. No joint deformities or effusions. Extremities: No clubbing or cyanosis. No edema.  Distal pedal pulses are 2+ bilaterally. Neuro: Alert and oriented X 3. Moves all extremities spontaneously. No focal deficits noted. Psych:  Responds to questions appropriately with a normal affect. Skin: No rashes or lesions noted  Wt Readings from Last 3 Encounters:  02/21/16 260 lb (117.9 kg)  05/22/13  262 lb 6.4 oz (119 kg)  12/14/12 255 lb (115.7 kg)        Studies/Labs Reviewed:   EKG:  EKG is*** ordered today.  The ekg ordered today demonstrates ***  Recent Labs: No results found for requested labs within last 8760 hours.   Lipid Panel    Component Value Date/Time   CHOL 196 12/13/2011 0500   TRIG 264 (H) 12/13/2011 0500   HDL 28 (L) 12/13/2011 0500   CHOLHDL 7.0 12/13/2011 0500   VLDL 53 (H) 12/13/2011 0500   LDLCALC 115 (H) 12/13/2011 0500    Additional studies/ records that were reviewed today include:   Echocardiogram: 10/2012 Study Conclusions  - Left ventricle: The cavity size was moderately dilated. Systolic function was moderately to severely reduced. The estimated ejection fraction was in the range of 30% to 35%. Akinesis of the mid-distalanteroseptal myocardium. - Left atrium:  The atrium was mildly to moderately dilated. - Right ventricle: Systolic function was moderately reduced.  NST: 10/2012   Assessment:    No diagnosis found.   Plan:   In order of problems listed above:  1. ***    Medication Adjustments/Labs and Tests Ordered: Current medicines are reviewed at length with the patient today.  Concerns regarding medicines are outlined above.  Medication changes, Labs and Tests ordered today are listed in the Patient Instructions below. There are no Patient Instructions on file for this visit.   Signed, Ellsworth LennoxBrittany M Strader, PA-C  06/01/2016 5:05 PM    [facility] [address], [address] [place], [place]  [address] Phone: [phone]; Fax: [phone]  [address], [address] [place], [place] [address] Phone: [phone]

## 2016-06-02 ENCOUNTER — Ambulatory Visit: Payer: Medicare HMO | Admitting: Student

## 2016-06-12 NOTE — Progress Notes (Deleted)
Jonathan Fischer Date of Birth: 01-11-1971 Medical Record #[id number]  History of Present Illness: Jonathan Fischer is seen back today for  followup CAD. Last seen by me in December 2014. He  has extensive CAD. Has an ischemic CM with EF of 25 to 30%. Has an ICD in place. Has had multiple MIs and multiple PCIs.   He had a  STEMI in December 2013 with occluded proximal LAD treated with repeat DES of the proximal LAD (occluded at the site of the prior stent). Stent in the LCX was patent. Stent in the mid RCA was patent. EF 25 to 30%. Troponin over 20. Echo shoed EF of 25 to 30% as well, septal apical and anterior wall AK and possible small layer of mural apical thrombus without mobility. Not felt to be a candidate for further anticoagulation due to his noncompliance.   He was readmitted in October 2014 with an ICD discharge. Interrogation confirmed this was ventricular tachycardia. He had normal cardiac enzymes. Potassium was mildly decreased. He had no evidence of increased congestive heart failure. We recommended continued medical therapy with beta blocker and potassium repletion. Echo showed ejection fraction was in the range of 30% to 35%. Akinesis of the mid-distalanteroseptal myocardium. Myoview showed a large anteroseptal scar without ischemia. ejection fraction was in the range of 30% to 35%. Akinesis of the mid-distalanteroseptal myocardium.  ICD interrogation and followup showed no recurrent episodes of ventricular tachycardia. His last remote ICD transmission was in August 2016. He has not seen Dr. Graciela Husbands since May 2015.   On followup today he states he is feeling well. He's had no recurrent tachycardia, chest pain, or shortness of breath. He is trying to quit smoking. He has reduced his cigarette intake to 4 cigarettes per day but is using e- cigarettes frequently.  Current Outpatient Prescriptions on File Prior to Visit  Medication Sig Dispense Refill  . aspirin EC 81 MG EC tablet Take 1 tablet  (81 mg total) by mouth daily. 30 tablet 3  . atorvastatin (LIPITOR) 80 MG tablet TAKE 1 TABLET EVERY DAY APPOINTMENT IS NEEDED  FOR  FURTHER  REFILLS 30 tablet 0  . metoprolol succinate (TOPROL-XL) 25 MG 24 hr tablet TAKE 1 TABLET EVERY DAY. NEED TO SCHEDULE APPOINTMENT WITH DR. Swaziland FOR FUTURE REFILLS 30 tablet 0  . nitroGLYCERIN (NITROSTAT) 0.4 MG SL tablet Place 1 tablet (0.4 mg total) under the tongue every 5 (five) minutes as needed. For chest pain 25 tablet 0  . omeprazole (PRILOSEC) 20 MG capsule Take 20 mg by mouth daily.    . potassium chloride SA (K-DUR,KLOR-CON) 20 MEQ tablet Take 1 tablet (20 mEq total) by mouth daily. 90 tablet 0  . prasugrel (EFFIENT) 10 MG TABS tablet Take 1 tablet (10 mg total) by mouth daily. 30 tablet 0  . ramipril (ALTACE) 10 MG capsule Take 10 mg by mouth daily.    [name] spironolactone (ALDACTONE) 25 MG tablet Take 25 mg by mouth daily.     No current facility-administered medications on file prior to visit.     No Known Allergies  Past Medical History:  Diagnosis Date  . Abnormal echocardiogram    a. 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  [name] CAD (coronary artery disease)    s/p prior LAD, LCx and RCA stenting remotely for an anterior MI, recurrent MI in 2010 with LAD stent occlusion s/p thrombectomy and PTCA,  STEMI 12/2011 s/p DES-prox LAD  . Chronic systolic CHF (congestive heart failure) (HCC)   . HTN (hypertension)   . Hyperlipidemia   . ICD (implantable cardiac defibrillator) in place   . Ischemic cardiomyopathy    EF 25-30% s/p single-chamber Medtronic ICD   . Obesity   . Tobacco abuse     Past Surgical History:  Procedure Laterality Date  . CARDIAC CATHETERIZATION  03/31/2009  . CARDIAC CATHETERIZATION  05/08/2008   CARDIAC SIZE AND SILHOUETTE NORMAL. THERE WAS ANTERIOR APICAL HYPOKINESIS WITH EF 35-40%  . CARDIAC DEFIBRILLATOR  PLACEMENT  11/2009  . CORONARY ANGIOPLASTY     BALLOON ANGIOPLASTY TO THE LAD WITH THROMBUS EXTRACTION OF THE PREVIOUSLY STENTED SEGMENT AND NEW STENT PLACEMENT TO THE LEFT CIRCUMFLEX. EF IS DOWN IN THE 30% RANGE  . CORONARY ANGIOPLASTY WITH STENT PLACEMENT  12/12/2011   30% mid LCx, mid RCA & mid LCx stents patent. LVEF 25-30%, mid-distal anterolateral wall AK, apex dilated, dyskinetic s/p DES-prox LAD stenosis.  [name] defib    . LEFT HEART CATH Bilateral 12/12/2011   Procedure: LEFT HEART CATH;  Surgeon: Francis Yardley M Swaziland, MD;  Location: [facility] CATH LAB;  Service: Cardiovascular;  Laterality: Bilateral;    History  Smoking Status  . Current Every Day Smoker  . Packs/day: 1.50  . Years: 24.00  . Types: E-cigarettes  Smokeless Tobacco  . Current User    History  Alcohol Use  . Yes    Comment: Occassionally.    Family History  Problem Relation Age of Onset  . Heart disease Father   . Cancer Mother     Review of Systems: The review of systems is per the HPI.  All other systems were reviewed and are negative.  Physical Exam: There were no vitals taken for this visit. Patient is in no acute distress. He is obese. Smells of tobacco. Skin is warm and dry. Color is normal.  HEENT is unremarkable. Normocephalic/atraumatic. PERRL. Sclera are nonicteric. Neck is supple. No masses. No JVD. Lungs are clear. Cardiac exam shows a regular rate and rhythm. Abdomen is soft. Extremities are without edema.  Gait and ROM are intact. No gross neurologic deficits noted.   LABORATORY DATA:  Lab Results  Component Value Date   WBC 11.0 (H) 09/15/2013   HGB 16.3 09/15/2013   HCT 46.1 09/15/2013   PLT 226 09/15/2013   GLUCOSE 148 (H) 09/15/2013   CHOL 196 12/13/2011   TRIG 264 (H) 12/13/2011   HDL 28 (L) 12/13/2011   LDLCALC 115 (H) 12/13/2011   ALT 60 (H) 09/15/2013   AST 33 09/15/2013   NA 139 09/15/2013   K 4.2 09/15/2013   CL 100 09/15/2013   CREATININE 0.96 09/15/2013   BUN 12 09/15/2013    CO2 25 09/15/2013   TSH 0.633 12/12/2011   INR 1.08 12/12/2011   HGBA1C 5.6 12/12/2011   Lab Results  Component Value Date   CKTOTAL 209 10/19/2011   CKMB 1.8 10/19/2011   TROPONINI <0.30 09/15/2013     Assessment / Plan:  1. CAD with recent STEMI/repeat DES to the LAD. No recurrent chest pain. No ischemia noted on Myoview study. Extensive anterior apical scar. Continue medical therapy.  2. Ischemic CM - EF is 30-35 %. Appears to be well compensated. We'll continue with his current medical therapy and diuretic therapy.   3. HLD  4. Ongoing tobacco abuse - he says he is ready to quit and knows that he has to quit. Attempting to quit  with the use of e- cigarettes. Has failed Chantix and Wellbutrin in the past.  5. Ventricular tachycardia. No clear triggers. Potassium was low. We'll continue potassium supplementation and beta blocker therapy. We will monitor for now. If he has recurrent ventricular tachycardia may need to consider antiarrhythmic drug therapy. We'll reassess his ICD in January.  I will see him back in 3 months and we will check fasting lab work at that time.

## 2016-06-14 ENCOUNTER — Ambulatory Visit: Payer: Medicare HMO | Admitting: Cardiology

## 2016-07-11 NOTE — Progress Notes (Signed)
Jonathan HarvestLarry E Fischer Fischer Date of Birth: [birth year] Medical Record #[id number]  History of Present Illness: Jonathan NajjarLarry is seen back today for  followup CAD. Last seen by me in December 2014. He  has extensive CAD. Has an ischemic CM with EF of 25 to 30%. Has an ICD in place. Has had multiple MIs and multiple PCIs.   He reports [place] [facility] [facility] He returned 6 months later and had another stent placed. He had a  STEMI in December 2013 with occluded proximal LAD treated with repeat DES of the proximal LAD (occluded at the site of the prior stent). Stent in the LCX was patent. Stent in the mid RCA was patent. EF 25 to 30%. Troponin over 20. Echo shoed EF of 25 to 30% as well, septal apical and anterior wall AK and possible small layer of mural apical thrombus without mobility. Not felt to be a candidate for further anticoagulation due to his noncompliance.   He was readmitted in October 2014 with an ICD discharge. Interrogation confirmed this was ventricular tachycardia. He had normal cardiac enzymes. Potassium was mildly decreased. He had no evidence of increased congestive heart failure. We recommended continued medical therapy with beta blocker and potassium repletion. Echo showed ejection fraction was in the range of 30% to 35%. Akinesis of the mid-distalanteroseptal myocardium. Myoview showed a large anteroseptal scar without ischemia. ejection fraction was in the range of 30% to 35%. Akinesis of the mid-distalanteroseptal myocardium.  ICD interrogation and followup showed no recurrent episodes of ventricular tachycardia. His last remote ICD transmission was in August 2016. He has not seen Dr. Graciela HusbandsKlein since May 2015.   The patient is seen today with his wife. He has not seen a doctor recently and quit taking his medication 2 years ago when his Rx ran out. Is only taking ASA daily. Over the last 2 months he has experienced symptoms of chest pain with exertion and at rest. This is  associated with DOE but no palpitations, dizziness, edema, orthopnea, or PND. No ICD discharges. Pain usually lasts less than 10 minutes. Now occurring daily. He just chews up ASA. Does not like to take Ntg because of bad HA. He continues to smoke 1-2 ppd. Doesn't eat healthy and has gained 20 lbs in last 4 years. Doesn't exercise much. Admits he hasn't taken care of himself.    Current Outpatient Prescriptions on File Prior to Visit  Medication Sig Dispense Refill  . aspirin EC 81 MG EC tablet Take 1 tablet (81 mg total) by mouth daily. 30 tablet 3   No current facility-administered medications on file prior to visit.     No Known Allergies  Past Medical History:  Diagnosis Date  . Abnormal echocardiogram    a. 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  . AUTOMATIC IMPLANTABLE CARDIAC DEFIBRILLATOR SITU 03/03/2010   Qualifier: Diagnosis of  By: Graciela HusbandsKlein, MD, Ruthann CancerFACC, Ty HiltsSteven Cochran   . CAD (coronary artery disease)    s/p prior LAD, LCx and RCA stenting remotely for an anterior MI, recurrent MI in 2010 with LAD stent occlusion s/p thrombectomy and PTCA, STEMI 12/2011 s/p DES-prox LAD  . Cardiomyopathy, ischemic 12/14/2011  . Chronic systolic CHF (congestive heart failure) (HCC)   . HTN (hypertension)   . Hyperlipidemia   . ICD (implantable cardiac defibrillator) in place   . Ischemic cardiomyopathy    EF  25-30% s/p single-chamber Medtronic ICD   . NSVT (nonsustained ventricular tachycardia) (HCC) 12/14/2011  . Obesity   . STEMI (ST elevation myocardial infarction) (HCC) 12/12/2011  . Tobacco abuse   . TOBACCO USER 10/29/2009   Qualifier: Diagnosis of  By: Graciela Husbands, MD, Susie Cassette   . Tooth pain 12/14/2011  . VT (ventricular tachycardia) (HCC) 10/18/2012    Past Surgical History:  Procedure Laterality Date  . CARDIAC CATHETERIZATION  03/31/2009  . CARDIAC CATHETERIZATION   05/08/2008   CARDIAC SIZE AND SILHOUETTE NORMAL. THERE WAS ANTERIOR APICAL HYPOKINESIS WITH EF 35-40%  . CARDIAC DEFIBRILLATOR PLACEMENT  11/2009  . CORONARY ANGIOPLASTY     BALLOON ANGIOPLASTY TO THE LAD WITH THROMBUS EXTRACTION OF THE PREVIOUSLY STENTED SEGMENT AND NEW STENT PLACEMENT TO THE LEFT CIRCUMFLEX. EF IS DOWN IN THE 30% RANGE  . CORONARY ANGIOPLASTY WITH STENT PLACEMENT  12/12/2011   30% mid LCx, mid RCA & mid LCx stents patent. LVEF 25-30%, mid-distal anterolateral wall AK, apex dilated, dyskinetic s/p DES-prox LAD stenosis.  [name] defib    . LEFT HEART CATH Bilateral 12/12/2011   Procedure: LEFT HEART CATH;  Surgeon: Peter M Swaziland, MD;  Location: [facility] CATH LAB;  Service: Cardiovascular;  Laterality: Bilateral;    History  Smoking Status  . Current Every Day Smoker  . Packs/day: 1.50  . Years: 24.00  . Types: E-cigarettes  Smokeless Tobacco  . Current User    History  Alcohol Use  . Yes    Comment: Occassionally.    Family History  Problem Relation Age of Onset  . Heart disease Father   . Cancer Mother     Review of Systems: The review of systems is per the HPI.  All other systems were reviewed and are negative.  Physical Exam: BP 140/84   Pulse 67   Ht 6\' 1"  (1.854 m)   Wt 276 lb (125.2 kg)   BMI 36.41 kg/m  Patient is an obese WM in no acute distress.  Smells of tobacco. Skin is warm and dry. Color is normal.  HEENT is unremarkable. Normocephalic/atraumatic. PERRL. Sclera are nonicteric. Neck is supple. No masses. No JVD. Lungs are clear. Cardiac exam shows a regular rate and rhythm.normal S1-2 without gallop or murmur. Abdomen is soft. Extremities are without edema. Pulses are 2+ and equal.  Gait and ROM are intact. No gross neurologic deficits noted.   LABORATORY DATA:  Lab Results  Component Value Date   WBC 11.0 (H) 09/15/2013   HGB 16.3 09/15/2013   HCT 46.1 09/15/2013   PLT 226 09/15/2013   GLUCOSE 148 (H) 09/15/2013   CHOL 196 12/13/2011   TRIG  264 (H) 12/13/2011   HDL 28 (L) 12/13/2011   LDLCALC 115 (H) 12/13/2011   ALT 60 (H) 09/15/2013   AST 33 09/15/2013   NA 139 09/15/2013   K 4.2 09/15/2013   CL 100 09/15/2013   CREATININE 0.96 09/15/2013   BUN 12 09/15/2013   CO2 25 09/15/2013   TSH 0.633 12/12/2011   INR 1.08 12/12/2011   HGBA1C 5.6 12/12/2011   Lab Results  Component Value Date   CKTOTAL 209 10/19/2011   CKMB 1.8 10/19/2011   TROPONINI <0.30 09/15/2013   Ecg today shows NSR with rate 67. Old anterior infarct. No acute change. I have personally reviewed and interpreted this study.  ICD interrogation today shows battery voltage of 2.9 volts. 6 episodes of NSVT. No device therapies.   Assessment / Plan:  1. CAD with extensive  history of CAD with multiple PCIs including LAD, LCx, RCA. S/p  STEMI/repeat DES to the LAD in Dec 2013. No ischemia noted on Myoview study in 2014. Extensive anterior apical scar. Now presents with progressive unstable angina. On no medical therapy other than ASA. Will start Toprol XL 25 mg daily, statin, and sl Ntg. Recommend proceeding with cardiac cath to define anatomy and therapeutic options. The procedure and risks were reviewed including but not limited to death, myocardial infarction, stroke, arrythmias, bleeding, transfusion, emergency surgery, dye allergy, or renal dysfunction. The patient voices understanding and is agreeable to proceed. Will schedule for Monday July 9.   2. Ischemic CM - EF is 30-35 %. No overt CHF.  Will resume beta blocker today. Depending on renal function will plan to resume ACEi and aldactone. Will check LV function at cath. May need Echo as well.   3. HLD- check fasting lab work today. Resume lipitor 80 mg daily. Discussed heart healthy diet with efforts at weight loss.  4. Ongoing tobacco abuse - Has failed Chantix and Wellbutrin in the past. Not motivated to quit at this time.  5. Ventricular tachycardia. No sustained recurrence. Will resume beta blocker  therapy. ICD interrogated today. Will need to get reestablished in device clinic for follow up.  6. Obesity- check glucose and A1c.   7. Noncompliance. Discussed importance of medical therapy to reduce future CV events or CHF progression. Motivation is poor.

## 2016-07-13 ENCOUNTER — Encounter (INDEPENDENT_AMBULATORY_CARE_PROVIDER_SITE_OTHER): Payer: Self-pay

## 2016-07-13 ENCOUNTER — Other Ambulatory Visit: Payer: Self-pay | Admitting: Cardiology

## 2016-07-13 ENCOUNTER — Encounter: Payer: Self-pay | Admitting: Cardiology

## 2016-07-13 ENCOUNTER — Ambulatory Visit (INDEPENDENT_AMBULATORY_CARE_PROVIDER_SITE_OTHER): Payer: Medicare HMO | Admitting: Cardiology

## 2016-07-13 ENCOUNTER — Ambulatory Visit
Admission: RE | Admit: 2016-07-13 | Discharge: 2016-07-13 | Disposition: A | Discharge status: Home / Self Care | Payer: Medicare HMO | Source: Ambulatory Visit | Attending: Cardiology | Admitting: Cardiology

## 2016-07-13 VITALS — BP 140/84 | HR 67 | Ht 73.0 in | Wt 276.0 lb

## 2016-07-13 DIAGNOSIS — E7800 Pure hypercholesterolemia, unspecified: Secondary | ICD-10-CM

## 2016-07-13 DIAGNOSIS — I25110 Atherosclerotic heart disease of native coronary artery with unstable angina pectoris: Secondary | ICD-10-CM | POA: Diagnosis not present

## 2016-07-13 DIAGNOSIS — I5022 Chronic systolic (congestive) heart failure: Secondary | ICD-10-CM

## 2016-07-13 DIAGNOSIS — I255 Ischemic cardiomyopathy: Secondary | ICD-10-CM | POA: Diagnosis not present

## 2016-07-13 DIAGNOSIS — F17200 Nicotine dependence, unspecified, uncomplicated: Secondary | ICD-10-CM

## 2016-07-13 DIAGNOSIS — I517 Cardiomegaly: Secondary | ICD-10-CM | POA: Diagnosis not present

## 2016-07-13 DIAGNOSIS — Z95810 Presence of automatic (implantable) cardiac defibrillator: Secondary | ICD-10-CM | POA: Diagnosis not present

## 2016-07-13 DIAGNOSIS — I472 Ventricular tachycardia, unspecified: Secondary | ICD-10-CM

## 2016-07-13 DIAGNOSIS — I200 Unstable angina: Secondary | ICD-10-CM

## 2016-07-13 DIAGNOSIS — Z01818 Encounter for other preprocedural examination: Secondary | ICD-10-CM | POA: Diagnosis not present

## 2016-07-13 MED ORDER — METOPROLOL SUCCINATE ER 25 MG PO TB24: 25.0000 mg | ORAL_TABLET | Freq: Every day | ORAL | 3 refills | Status: DC

## 2016-07-13 MED ORDER — NITROGLYCERIN 0.4 MG SL SUBL: 0.4000 mg | SUBLINGUAL_TABLET | SUBLINGUAL | 3 refills | Status: DC | PRN

## 2016-07-13 MED ORDER — ATORVASTATIN CALCIUM 80 MG PO TABS: 80.0000 mg | ORAL_TABLET | Freq: Every day | ORAL | 3 refills | Status: DC

## 2016-07-13 NOTE — Patient Instructions (Signed)
We will check blood work and CXR today  We will schedule you for a cardiac cath  You need to stop smoking.

## 2016-07-14 LAB — BASIC METABOLIC PANEL
BUN/Creatinine Ratio: 16 (ref 9–20)
BUN: 13 mg/dL (ref 6–24)
CHLORIDE: 96 mmol/L (ref 96–106)
CO2: 23 mmol/L (ref 20–29)
Calcium: 9.3 mg/dL (ref 8.7–10.2)
Creatinine, Ser: 0.83 mg/dL (ref 0.76–1.27)
GFR calc Af Amer: 124 mL/min/{1.73_m2} (ref >59)
GFR, EST NON AFRICAN AMERICAN: 107 mL/min/{1.73_m2} (ref >59)
GLUCOSE: 169 mg/dL — AB (ref 65–99)
POTASSIUM: 4.6 mmol/L (ref 3.5–5.2)
SODIUM: 135 mmol/L (ref 134–144)

## 2016-07-14 LAB — CBC WITH DIFFERENTIAL/PLATELET
BASOS ABS: 0 10*3/uL (ref 0.0–0.2)
BASOS: 0 %
EOS (ABSOLUTE): 0.1 10*3/uL (ref 0.0–0.4)
Eos: 1 %
HEMOGLOBIN: 17.5 g/dL (ref 13.0–17.7)
Hematocrit: 49.8 % (ref 37.5–51.0)
IMMATURE GRANULOCYTES: 1 %
Immature Grans (Abs): 0.1 10*3/uL (ref 0.0–0.1)
Lymphocytes Absolute: 3 10*3/uL (ref 0.7–3.1)
Lymphs: 40 %
MCH: 32.8 pg (ref 26.6–33.0)
MCHC: 35.1 g/dL (ref 31.5–35.7)
MCV: 93 fL (ref 79–97)
MONOS ABS: 0.4 10*3/uL (ref 0.1–0.9)
Monocytes: 5 %
NEUTROS PCT: 53 %
Neutrophils Absolute: 3.9 10*3/uL (ref 1.4–7.0)
PLATELETS: 200 10*3/uL (ref 150–379)
RBC: 5.33 x10E6/uL (ref 4.14–5.80)
RDW: 14.1 % (ref 12.3–15.4)
WBC: 7.4 10*3/uL (ref 3.4–10.8)

## 2016-07-14 LAB — LIPID PANEL
CHOL/HDL RATIO: 9.9 ratio — AB (ref 0.0–5.0)
Cholesterol, Total: 228 mg/dL — ABNORMAL HIGH (ref 100–199)
HDL: 23 mg/dL — ABNORMAL LOW (ref >39)
Triglycerides: 559 mg/dL (ref 0–149)

## 2016-07-14 LAB — HEPATIC FUNCTION PANEL
ALBUMIN: 4.2 g/dL (ref 3.5–5.5)
ALK PHOS: 66 IU/L (ref 39–117)
ALT: 47 IU/L — AB (ref 0–44)
AST: 31 IU/L (ref 0–40)
Bilirubin Total: 0.3 mg/dL (ref 0.0–1.2)
Bilirubin, Direct: 0.11 mg/dL (ref 0.00–0.40)
Total Protein: 7.4 g/dL (ref 6.0–8.5)

## 2016-07-14 LAB — PT AND PTT
INR: 1 (ref 0.8–1.2)
Prothrombin Time: 11 s (ref 9.1–12.0)
aPTT: 28 s (ref 24–33)

## 2016-07-14 LAB — HEMOGLOBIN A1C
ESTIMATED AVERAGE GLUCOSE: 171 mg/dL
HEMOGLOBIN A1C: 7.6 % — AB (ref 4.8–5.6)

## 2016-07-15 ENCOUNTER — Telehealth: Payer: Self-pay | Admitting: Cardiology

## 2016-07-15 NOTE — Telephone Encounter (Signed)
Notes recorded by SwazilandJordan, Peter M, MD on 07/14/2016 at 9:26 AM EDT Triglycerides are quite high. Unable to accurately assess cholesterol due to high triglycerides. Just resumed lipitor.  Sugar is elevated and A1c in 7.6% consistent with newly diagnosed diabetes. Needs to start a low Carb diet and lose weight. Will need to get established with primary care post cath to manage diabetes. Other chemistries, CBC, and coags are OK.  Jonathan Fischer (spouse)-notified she will call and PCP appt she will call back and let us know who it is so we can forward lab results to them

## 2016-07-15 NOTE — Telephone Encounter (Signed)
New message      Calling to talk to the nurse to get info nurse told pt earlier regarding lab results.

## 2016-07-16 ENCOUNTER — Telehealth: Payer: Self-pay

## 2016-07-16 NOTE — Telephone Encounter (Signed)
Patient contacted [facility] at [facility] scheduled for: 07/19/2016 @ 1030 Verified arrival time and place: NT @ 0800 Confirmed AM meds to be taken pre-cath with sip of water:  Instructed to take ASA prior to arrival Confirmed patient has responsible person to drive home post procedure and observe patient for 24 hours:  Yes-wife Addl concerns:  None expressed

## 2016-07-19 ENCOUNTER — Encounter (HOSPITAL_COMMUNITY): Payer: Self-pay | Admitting: Cardiology

## 2016-07-19 ENCOUNTER — Ambulatory Visit (HOSPITAL_COMMUNITY)
Admission: RE | Admit: 2016-07-19 | Discharge: 2016-07-19 | Disposition: A | Discharge status: Home / Self Care | Payer: Medicare HMO | Source: Ambulatory Visit | Attending: Cardiology | Admitting: Cardiology

## 2016-07-19 ENCOUNTER — Encounter (HOSPITAL_COMMUNITY)
Admission: RE | Disposition: A | Discharge status: Home / Self Care | Payer: Self-pay | Source: Ambulatory Visit | Attending: Cardiology

## 2016-07-19 DIAGNOSIS — I110 Hypertensive heart disease with heart failure: Secondary | ICD-10-CM | POA: Diagnosis not present

## 2016-07-19 DIAGNOSIS — Y838 Other surgical procedures as the cause of abnormal reaction of the patient, or of later complication, without mention of misadventure at the time of the procedure: Secondary | ICD-10-CM | POA: Diagnosis not present

## 2016-07-19 DIAGNOSIS — Z95810 Presence of automatic (implantable) cardiac defibrillator: Secondary | ICD-10-CM | POA: Diagnosis not present

## 2016-07-19 DIAGNOSIS — E669 Obesity, unspecified: Secondary | ICD-10-CM | POA: Diagnosis not present

## 2016-07-19 DIAGNOSIS — E785 Hyperlipidemia, unspecified: Secondary | ICD-10-CM | POA: Diagnosis present

## 2016-07-19 DIAGNOSIS — Z7982 Long term (current) use of aspirin: Secondary | ICD-10-CM | POA: Diagnosis not present

## 2016-07-19 DIAGNOSIS — F17210 Nicotine dependence, cigarettes, uncomplicated: Secondary | ICD-10-CM | POA: Insufficient documentation

## 2016-07-19 DIAGNOSIS — I25110 Atherosclerotic heart disease of native coronary artery with unstable angina pectoris: Secondary | ICD-10-CM | POA: Diagnosis present

## 2016-07-19 DIAGNOSIS — F17200 Nicotine dependence, unspecified, uncomplicated: Secondary | ICD-10-CM | POA: Diagnosis present

## 2016-07-19 DIAGNOSIS — Z6836 Body mass index (BMI) 36.0-36.9, adult: Secondary | ICD-10-CM | POA: Diagnosis not present

## 2016-07-19 DIAGNOSIS — T82855A Stenosis of coronary artery stent, initial encounter: Secondary | ICD-10-CM | POA: Diagnosis not present

## 2016-07-19 DIAGNOSIS — Z9861 Coronary angioplasty status: Secondary | ICD-10-CM

## 2016-07-19 DIAGNOSIS — I472 Ventricular tachycardia, unspecified: Secondary | ICD-10-CM

## 2016-07-19 DIAGNOSIS — I200 Unstable angina: Secondary | ICD-10-CM

## 2016-07-19 DIAGNOSIS — I252 Old myocardial infarction: Secondary | ICD-10-CM | POA: Insufficient documentation

## 2016-07-19 DIAGNOSIS — I5022 Chronic systolic (congestive) heart failure: Secondary | ICD-10-CM | POA: Diagnosis not present

## 2016-07-19 DIAGNOSIS — Z9119 Patient's noncompliance with other medical treatment and regimen: Secondary | ICD-10-CM | POA: Diagnosis not present

## 2016-07-19 DIAGNOSIS — I255 Ischemic cardiomyopathy: Secondary | ICD-10-CM | POA: Insufficient documentation

## 2016-07-19 HISTORY — PX: LEFT HEART CATH AND CORONARY ANGIOGRAPHY: CATH118249

## 2016-07-19 HISTORY — PX: CORONARY BALLOON ANGIOPLASTY: CATH118233

## 2016-07-19 HISTORY — PX: CORONARY ANGIOPLASTY: SHX604

## 2016-07-19 LAB — POCT ACTIVATED CLOTTING TIME: Activated Clotting Time: 335 seconds

## 2016-07-19 SURGERY — LEFT HEART CATH AND CORONARY ANGIOGRAPHY
Anesthesia: LOCAL

## 2016-07-19 MED ORDER — ATORVASTATIN CALCIUM 80 MG PO TABS: 80.0000 mg | ORAL_TABLET | Freq: Every day | ORAL | Status: DC

## 2016-07-19 MED ORDER — SODIUM CHLORIDE 0.9 % IV SOLN: 250.0000 mL | INTRAVENOUS | Status: DC | PRN

## 2016-07-19 MED ORDER — LIDOCAINE HCL (PF) 1 % IJ SOLN
INTRAMUSCULAR | Status: DC | PRN
  Administered 2016-07-19: 2 mL

## 2016-07-19 MED ORDER — MIDAZOLAM HCL 2 MG/2ML IJ SOLN
INTRAMUSCULAR | Status: AC
  Filled 2016-07-19: qty 2

## 2016-07-19 MED ORDER — ONDANSETRON HCL 4 MG/2ML IJ SOLN: 4.0000 mg | Freq: Four times a day (QID) | INTRAMUSCULAR | Status: DC | PRN

## 2016-07-19 MED ORDER — HEPARIN SODIUM (PORCINE) 1000 UNIT/ML IJ SOLN
INTRAMUSCULAR | Status: AC
  Filled 2016-07-19: qty 1

## 2016-07-19 MED ORDER — CLOPIDOGREL BISULFATE 75 MG PO TABS: 75.0000 mg | ORAL_TABLET | Freq: Every day | ORAL | 3 refills | Status: AC

## 2016-07-19 MED ORDER — IOPAMIDOL (ISOVUE-370) INJECTION 76%
INTRAVENOUS | Status: AC
  Filled 2016-07-19: qty 100

## 2016-07-19 MED ORDER — ACETAMINOPHEN 325 MG PO TABS: 650.0000 mg | ORAL_TABLET | ORAL | Status: DC | PRN

## 2016-07-19 MED ORDER — ANGIOPLASTY BOOK
Freq: Once | Status: DC
  Filled 2016-07-19: qty 1

## 2016-07-19 MED ORDER — FAMOTIDINE IN NACL 20-0.9 MG/50ML-% IV SOLN
INTRAVENOUS | Status: AC | PRN
  Administered 2016-07-19: 20 mg via INTRAVENOUS

## 2016-07-19 MED ORDER — VERAPAMIL HCL 2.5 MG/ML IV SOLN
INTRAVENOUS | Status: AC
  Filled 2016-07-19: qty 2

## 2016-07-19 MED ORDER — NITROGLYCERIN 0.4 MG SL SUBL: 0.4000 mg | SUBLINGUAL_TABLET | SUBLINGUAL | Status: DC | PRN

## 2016-07-19 MED ORDER — MIDAZOLAM HCL 2 MG/2ML IJ SOLN
INTRAMUSCULAR | Status: DC | PRN
  Administered 2016-07-19: 2 mg via INTRAVENOUS

## 2016-07-19 MED ORDER — SODIUM CHLORIDE 0.9% FLUSH: 3.0000 mL | Freq: Two times a day (BID) | INTRAVENOUS | Status: DC

## 2016-07-19 MED ORDER — ASPIRIN 81 MG PO TBEC
81.0000 mg | DELAYED_RELEASE_TABLET | Freq: Every evening | ORAL | Status: DC
Start: 2016-07-19 — End: 2016-07-19

## 2016-07-19 MED ORDER — HEPARIN (PORCINE) IN NACL 2-0.9 UNIT/ML-% IJ SOLN
INTRAMUSCULAR | Status: AC
  Filled 2016-07-19: qty 1000

## 2016-07-19 MED ORDER — SODIUM CHLORIDE 0.9% FLUSH: 3.0000 mL | INTRAVENOUS | Status: DC | PRN

## 2016-07-19 MED ORDER — LISINOPRIL 10 MG PO TABS: 10.0000 mg | ORAL_TABLET | Freq: Every day | ORAL | 0 refills | Status: DC

## 2016-07-19 MED ORDER — LIDOCAINE HCL 1 % IJ SOLN
INTRAMUSCULAR | Status: AC
  Filled 2016-07-19: qty 20

## 2016-07-19 MED ORDER — FAMOTIDINE IN NACL 20-0.9 MG/50ML-% IV SOLN
INTRAVENOUS | Status: AC
  Filled 2016-07-19: qty 50

## 2016-07-19 MED ORDER — METOPROLOL SUCCINATE ER 25 MG PO TB24: 25.0000 mg | ORAL_TABLET | Freq: Every evening | ORAL | Status: DC

## 2016-07-19 MED ORDER — HEPARIN (PORCINE) IN NACL 2-0.9 UNIT/ML-% IJ SOLN
INTRAMUSCULAR | Status: AC | PRN
  Administered 2016-07-19: 1000 mL

## 2016-07-19 MED ORDER — LISINOPRIL 10 MG PO TABS: 10.0000 mg | ORAL_TABLET | Freq: Every day | ORAL | 3 refills | Status: DC

## 2016-07-19 MED ORDER — CLOPIDOGREL BISULFATE 300 MG PO TABS
ORAL_TABLET | ORAL | Status: DC | PRN
  Administered 2016-07-19: 600 mg via ORAL

## 2016-07-19 MED ORDER — FENTANYL CITRATE (PF) 100 MCG/2ML IJ SOLN
INTRAMUSCULAR | Status: AC
  Filled 2016-07-19: qty 2

## 2016-07-19 MED ORDER — FENTANYL CITRATE (PF) 100 MCG/2ML IJ SOLN
INTRAMUSCULAR | Status: DC | PRN
  Administered 2016-07-19: 25 ug via INTRAVENOUS

## 2016-07-19 MED ORDER — CLOPIDOGREL BISULFATE 75 MG PO TABS: 75.0000 mg | ORAL_TABLET | Freq: Every day | ORAL | 0 refills | Status: DC

## 2016-07-19 MED ORDER — ASPIRIN 81 MG PO CHEW: 81.0000 mg | CHEWABLE_TABLET | ORAL | Status: DC

## 2016-07-19 MED ORDER — SODIUM CHLORIDE 0.9 % IV SOLN: INTRAVENOUS | Status: DC

## 2016-07-19 MED ORDER — HEPARIN SODIUM (PORCINE) 1000 UNIT/ML IJ SOLN
INTRAMUSCULAR | Status: DC | PRN
  Administered 2016-07-19: 6000 [IU] via INTRAVENOUS
  Administered 2016-07-19: 5000 [IU] via INTRAVENOUS

## 2016-07-19 MED ORDER — CLOPIDOGREL BISULFATE 75 MG PO TABS: 75.0000 mg | ORAL_TABLET | Freq: Every day | ORAL | Status: DC

## 2016-07-19 MED ORDER — IOPAMIDOL (ISOVUE-370) INJECTION 76%
INTRAVENOUS | Status: DC | PRN
  Administered 2016-07-19: 115 mL via INTRA_ARTERIAL

## 2016-07-19 MED FILL — CLOPIDOGREL 75 MG TABLET: 75 | 30 days supply | Qty: 30 | Fill #0

## 2016-07-19 SURGICAL SUPPLY — 19 items
BALLN EUPHORA RX 2.5X12 (BALLOONS) ×2
BALLN WOLVERINE 3.50X10 (BALLOONS) ×2
BALLOON EUPHORA RX 2.5X12 (BALLOONS) IMPLANT
BALLOON WOLVERINE 3.50X10 (BALLOONS) IMPLANT
CATH EXPO 5F FL3.5 (CATHETERS) ×1 IMPLANT
CATH INFINITI 5FR ANG PIGTAIL (CATHETERS) ×1 IMPLANT
CATH INFINITI JR4 5F (CATHETERS) ×1 IMPLANT
CATH LAUNCHER 6FR JR4 (CATHETERS) ×1 IMPLANT
DEVICE RAD COMP TR BAND LRG (VASCULAR PRODUCTS) ×1 IMPLANT
GLIDESHEATH SLEND SS 6F .021 (SHEATH) ×1 IMPLANT
GUIDEWIRE INQWIRE 1.5J.035X260 (WIRE) IMPLANT
INQWIRE 1.5J .035X260CM (WIRE) ×2
KIT ENCORE 26 ADVANTAGE (KITS) ×1 IMPLANT
KIT HEART LEFT (KITS) ×2 IMPLANT
PACK CARDIAC CATHETERIZATION (CUSTOM PROCEDURE TRAY) ×2 IMPLANT
SYR MEDRAD MARK V 150ML (SYRINGE) ×2 IMPLANT
TRANSDUCER W/STOPCOCK (MISCELLANEOUS) ×2 IMPLANT
TUBING CIL FLEX 10 FLL-RA (TUBING) ×2 IMPLANT
WIRE ASAHI PROWATER 180CM (WIRE) ×1 IMPLANT

## 2016-07-19 NOTE — Discharge Summary (Signed)
Discharge Summary    Patient ID: Jonathan Fischer,  MRN: [id number], DOB/AGE: March 02, 1970 46 y.o.  Admit date: 07/19/2016 Discharge date: 07/19/2016  Primary Care Provider: Pearson Grippe Primary Cardiologist: Dr. Swaziland  EP: Dr. 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  Prox Cx to Mid Cx lesion, 0 %stenosed.  Prox Cx lesion, 30 %stenosed.  Prox LAD to Mid LAD lesion, 0 %stenosed.  Dist LAD lesion, 60 %stenosed.  The left ventricular ejection fraction is 25-35% by visual estimate.  There is severe left ventricular systolic dysfunction.  LV end diastolic pressure is mildly elevated.  Mid RCA lesion, 95 %stenosed.  Post intervention, there is Fischer 0% residual stenosis.   1. Single vessel obstructive CAD with focal instent restenosis in the mid RCA 2. Severe LV dysfunction. EF 30-35%. 3. Mildly elevated LVEDP  Plan: Continue DAPT for one year (due to increased CV risk and USAP). Patient is Fischer candidate for same day discharge. Aggressive risk factor modification.     Diagnostic Diagram       Post-Intervention Diagram            History of Present Illness     Jonathan Fischer is Fischer 46 y/o M with history of HTN, obesity, tobacco abuse, CAD (h/o prior MI/ multiple stenting stenting to LAD, RCA and Lcx), ICM s/p single-chamber Medtronic ICD admitted to scheduled cath.   Recently seen by Dr. Swaziland 07/13/16 after > 3 years. Last seen by Dr. Graciela Husbands 05/2013.  Quit taking his medication 2 years ago  when his Rx ran out. Is only taking ASA daily. Over the last 2 months he has experienced symptoms of chest pain with exertion and at rest. No ICD discharge. Recommend proceeding with cardiac cath to define anatomy and therapeutic options.  Hospital Course     Consultants: None  Presented for schedule cath. Cath showed single vessel obstructive CAD with focal instent restenosis in the mid RCA s/p angioplasty. Detailed cath report as above. Severe LV dysfunction. EF 30-35%. Continue ASA, Plavix, statin and BB. Added Lisinopril 10mg  qd. Titrate up as outpatient.  Recent A1c 7.6. F/u with PCP. Cath site stable. Patient will received 30 days supply of plavix from [facility] outpatient pharmacy.  30 days supplies of lisinopril send to local [facility] pharmacy. 90 days supplies of Plavix and Lisinopril send to [facility].   The patient has been seen by Dr. Swaziland today and deemed ready for discharge home. All follow-up appointments have been scheduled. Discharge medications are listed below.   Discharge Vitals Blood pressure (!) 146/77, pulse 74, temperature 98.2 F (36.8 C), temperature source Oral, resp. rate 17, height 6\' 1"  (1.854 m), weight 276 lb (125.2 kg), SpO2 93 %.  Filed Weights   07/19/16 0819  Weight: 276 lb (125.2 kg)    Labs & Radiologic Studies     CBC  Dg Chest 2 View  Result Date: 07/13/2016 CLINICAL DATA:  Preoperative examination prior to cardiac catheterization. The patient has had 3 weeks  of unstable Anjeanette. History of chronic CHF, ischemic cardiomyopathy, ventricular tachyarrhythmias. Current smoker. EXAM: CHEST  2 VIEW COMPARISON:  PA and lateral chest x-ray of September 15, 2013 FINDINGS: The lungs are adequately inflated. The lung markings are mildly prominent but stable. There is no alveolar infiltrate or pleural effusion. The heart is mildly enlarged. Fischer coronary stent is visible. The pulmonary vascularity is not engorged. The ICD is in stable position. The bony thorax exhibits no  acute abnormality. IMPRESSION: No acute pneumonia nor CHF. The ICD is in stable position. Mild chronic smoking related interstitial changes of both lungs. Electronically Signed   By: David  SwazilandJordan M.D.   On: 07/13/2016 11:30    Disposition   Pt is being discharged home today in good condition.  Follow-up Plans & Appointments    Follow-up Information    Pearson GrippeKim, James, MD Follow up in 1 week(s).   Specialty:  Internal Medicine Why:  for diabetis Contact information: [address] [place] [address] [phone]        SwazilandJordan, Peter M, MD. Go on 08/02/2016.   Specialty:  Cardiology Why:  @10 :40 for post PCI follow up Contact information: [address] [place] [address] [phone]          Discharge Instructions    Amb Referral to Cardiac Rehabilitation    Complete by:  As directed    Diagnosis:  PTCA      Discharge Medications   Current Discharge Medication List    START taking these medications   Details  !! clopidogrel (PLAVIX) 75 MG tablet Take 1 tablet (75 mg total) by mouth daily. Qty: 30 tablet, Refills: 0    !! clopidogrel (PLAVIX) 75 MG tablet Take 1 tablet (75 mg total) by mouth daily with breakfast. Qty: 90 tablet, Refills: 3    !! lisinopril (PRINIVIL,ZESTRIL) 10 MG tablet Take 1 tablet (10 mg total) by mouth daily. Qty: 90 tablet, Refills: 3    !! lisinopril (PRINIVIL,ZESTRIL) 10 MG tablet Take 1 tablet (10 mg total) by mouth daily. Qty: 30 tablet, Refills: 0     !! - Potential duplicate medications found. Please discuss with provider.    CONTINUE these medications which have NOT CHANGED   Details  aspirin EC 81 MG EC tablet Take 1 tablet (81 mg total) by mouth daily. Qty: 30 tablet, Refills: 3    atorvastatin (LIPITOR) 80 MG tablet Take 1 tablet (80 mg total) by mouth daily at 6 PM. Qty: 30 tablet, Refills: 3    metoprolol succinate (TOPROL-XL) 25 MG 24 hr tablet Take 1 tablet (25 mg total) by mouth  daily. Qty: 30 tablet, Refills: 3    nitroGLYCERIN (NITROSTAT) 0.4 MG SL tablet Place 1 tablet (0.4 mg total) under the tongue every 5 (five) minutes as needed. For chest pain Qty: 25 tablet, Refills: 3    oxymetazoline (AFRIN) 0.05 % nasal spray Place 1 spray into both nostrils 4 (four) times daily as needed for congestion.        Outstanding Labs/Studies   Lipid panel and LFT in 6 weeks BMET during follow up  Duration of Discharge Encounter   Greater than 30 minutes including physician time.  Signed, Armin Yerger PA-C  07/19/2016, 2:33 PM

## 2016-07-19 NOTE — H&P (View-Only) (Signed)
Bess HarvestLarry E Seubert II Date of Birth: [birth year] Medical Record #[id number]  History of Present Illness: Jonathan Fischer is seen back today for  followup CAD. Last seen by me in December 2014. He  has extensive CAD. Has an ischemic CM with EF of 25 to 30%. Has an ICD in place. Has had multiple MIs and multiple PCIs.   He reports [place] [facility] [place] [facility]. He returned 6 months later and had another stent placed. He had a  STEMI in December 2013 with occluded proximal LAD treated with repeat DES of the proximal LAD (occluded at the site of the prior stent). Stent in the LCX was patent. Stent in the mid RCA was patent. EF 25 to 30%. Troponin over 20. Echo shoed EF of 25 to 30% as well, septal apical and anterior wall AK and possible small layer of mural apical thrombus without mobility. Not felt to be a candidate for further anticoagulation due to his noncompliance.   He was readmitted in October 2014 with an ICD discharge. Interrogation confirmed this was ventricular tachycardia. He had normal cardiac enzymes. Potassium was mildly decreased. He had no evidence of increased congestive heart failure. We recommended continued medical therapy with beta blocker and potassium repletion. Echo showed ejection fraction was in the range of 30% to 35%. Akinesis of the mid-distalanteroseptal myocardium. Myoview showed a large anteroseptal scar without ischemia. ejection fraction was in the range of 30% to 35%. Akinesis of the mid-distalanteroseptal myocardium.  ICD interrogation and followup showed no recurrent episodes of ventricular tachycardia. His last remote ICD transmission was in August 2016. He has not seen Dr. Graciela HusbandsKlein since May 2015.   The patient is seen today with his wife. He has not seen a doctor recently and quit taking his medication 2 years ago when his Rx ran out. Is only taking ASA daily. Over the last 2 months he has experienced symptoms of chest pain with exertion and at rest. This is  associated with DOE but no palpitations, dizziness, edema, orthopnea, or PND. No ICD discharges. Pain usually lasts less than 10 minutes. Now occurring daily. He just chews up ASA. Does not like to take Ntg because of bad HA. He continues to smoke 1-2 ppd. Doesn't eat healthy and has gained 20 lbs in last 4 years. Doesn't exercise much. Admits he hasn't taken care of himself.    Current Outpatient Prescriptions on File Prior to Visit  Medication Sig Dispense Refill  . aspirin EC 81 MG EC tablet Take 1 tablet (81 mg total) by mouth daily. 30 tablet 3   No current facility-administered medications on file prior to visit.     No Known Allergies  Past Medical History:  Diagnosis Date  . Abnormal echocardiogram    a. 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  . AUTOMATIC IMPLANTABLE CARDIAC DEFIBRILLATOR SITU 03/03/2010   Qualifier: Diagnosis of  By: Graciela HusbandsKlein, MD, Ruthann CancerFACC, Ty HiltsSteven Cochran   . CAD (coronary artery disease)    s/p prior LAD, LCx and RCA stenting remotely for an anterior MI, recurrent MI in 2010 with LAD stent occlusion s/p thrombectomy and PTCA, STEMI 12/2011 s/p DES-prox LAD  . Cardiomyopathy, ischemic 12/14/2011  . Chronic systolic CHF (congestive heart failure) (HCC)   . HTN (hypertension)   . Hyperlipidemia   . ICD (implantable cardiac defibrillator) in place   . Ischemic cardiomyopathy    EF  25-30% s/p single-chamber Medtronic ICD   . NSVT (nonsustained ventricular tachycardia) (HCC) 12/14/2011  . Obesity   . STEMI (ST elevation myocardial infarction) (HCC) 12/12/2011  . Tobacco abuse   . TOBACCO USER 10/29/2009   Qualifier: Diagnosis of  By: Graciela Husbands, MD, Susie Cassette   . Tooth pain 12/14/2011  . VT (ventricular tachycardia) (HCC) 10/18/2012    Past Surgical History:  Procedure Laterality Date  . CARDIAC CATHETERIZATION  03/31/2009  . CARDIAC CATHETERIZATION   05/08/2008   CARDIAC SIZE AND SILHOUETTE NORMAL. THERE WAS ANTERIOR APICAL HYPOKINESIS WITH EF 35-40%  . CARDIAC DEFIBRILLATOR PLACEMENT  11/2009  . CORONARY ANGIOPLASTY     BALLOON ANGIOPLASTY TO THE LAD WITH THROMBUS EXTRACTION OF THE PREVIOUSLY STENTED SEGMENT AND NEW STENT PLACEMENT TO THE LEFT CIRCUMFLEX. EF IS DOWN IN THE 30% RANGE  . CORONARY ANGIOPLASTY WITH STENT PLACEMENT  12/12/2011   30% mid LCx, mid RCA & mid LCx stents patent. LVEF 25-30%, mid-distal anterolateral wall AK, apex dilated, dyskinetic s/p DES-prox LAD stenosis.  [name] defib    . LEFT HEART CATH Bilateral 12/12/2011   Procedure: LEFT HEART CATH;  Surgeon: Tyquasia Pant M Swaziland, MD;  Location: [facility] CATH LAB;  Service: Cardiovascular;  Laterality: Bilateral;    History  Smoking Status  . Current Every Day Smoker  . Packs/day: 1.50  . Years: 24.00  . Types: E-cigarettes  Smokeless Tobacco  . Current User    History  Alcohol Use  . Yes    Comment: Occassionally.    Family History  Problem Relation Age of Onset  . Heart disease Father   . Cancer Mother     Review of Systems: The review of systems is per the HPI.  All other systems were reviewed and are negative.  Physical Exam: BP 140/84   Pulse 67   Ht 6\' 1"  (1.854 m)   Wt 276 lb (125.2 kg)   BMI 36.41 kg/m  Patient is an obese WM in no acute distress.  Smells of tobacco. Skin is warm and dry. Color is normal.  HEENT is unremarkable. Normocephalic/atraumatic. PERRL. Sclera are nonicteric. Neck is supple. No masses. No JVD. Lungs are clear. Cardiac exam shows a regular rate and rhythm.normal S1-2 without gallop or murmur. Abdomen is soft. Extremities are without edema. Pulses are 2+ and equal.  Gait and ROM are intact. No gross neurologic deficits noted.   LABORATORY DATA:  Lab Results  Component Value Date   WBC 11.0 (H) 09/15/2013   HGB 16.3 09/15/2013   HCT 46.1 09/15/2013   PLT 226 09/15/2013   GLUCOSE 148 (H) 09/15/2013   CHOL 196 12/13/2011   TRIG  264 (H) 12/13/2011   HDL 28 (L) 12/13/2011   LDLCALC 115 (H) 12/13/2011   ALT 60 (H) 09/15/2013   AST 33 09/15/2013   NA 139 09/15/2013   K 4.2 09/15/2013   CL 100 09/15/2013   CREATININE 0.96 09/15/2013   BUN 12 09/15/2013   CO2 25 09/15/2013   TSH 0.633 12/12/2011   INR 1.08 12/12/2011   HGBA1C 5.6 12/12/2011   Lab Results  Component Value Date   CKTOTAL 209 10/19/2011   CKMB 1.8 10/19/2011   TROPONINI <0.30 09/15/2013   Ecg today shows NSR with rate 67. Old anterior infarct. No acute change. I have personally reviewed and interpreted this study.  ICD interrogation today shows battery voltage of 2.9 volts. 6 episodes of NSVT. No device therapies.   Assessment / Plan:  1. CAD with extensive  history of CAD with multiple PCIs including LAD, LCx, RCA. S/p  STEMI/repeat DES to the LAD in Dec 2013. No ischemia noted on Myoview study in 2014. Extensive anterior apical scar. Now presents with progressive unstable angina. On no medical therapy other than ASA. Will start Toprol XL 25 mg daily, statin, and sl Ntg. Recommend proceeding with cardiac cath to define anatomy and therapeutic options. The procedure and risks were reviewed including but not limited to death, myocardial infarction, stroke, arrythmias, bleeding, transfusion, emergency surgery, dye allergy, or renal dysfunction. The patient voices understanding and is agreeable to proceed. Will schedule for Monday July 9.   2. Ischemic CM - EF is 30-35 %. No overt CHF.  Will resume beta blocker today. Depending on renal function will plan to resume ACEi and aldactone. Will check LV function at cath. May need Echo as well.   3. HLD- check fasting lab work today. Resume lipitor 80 mg daily. Discussed heart healthy diet with efforts at weight loss.  4. Ongoing tobacco abuse - Has failed Chantix and Wellbutrin in the past. Not motivated to quit at this time.  5. Ventricular tachycardia. No sustained recurrence. Will resume beta blocker  therapy. ICD interrogated today. Will need to get reestablished in device clinic for follow up.  6. Obesity- check glucose and A1c.   7. Noncompliance. Discussed importance of medical therapy to reduce future CV events or CHF progression. Motivation is poor.

## 2016-07-19 NOTE — Progress Notes (Signed)
Up and walked and tolerated well 

## 2016-07-19 NOTE — Discharge Instructions (Signed)

## 2016-07-19 NOTE — Progress Notes (Signed)
Cardiac rehab in to see client earlier,client given angioplasty book, client watched angioplasty educational video

## 2016-07-19 NOTE — Progress Notes (Signed)
CARDIAC REHAB PHASE I   Pt s/p PCI for same day discharge. Completed PCI education with pt and wife at bedside.  Reviewed risk factors, tobacco cessation, anti-platelet therapy, activity restrictions, ntg, exercise, heart healthy and diabetes diet handouts, sodium restrictions, s/s heart failure, daily weights and phase 2 cardiac rehab. Pt and verbalized understanding, pt with limited receptiveness to education, states he is not willing to make dietary changes, exercise and quit smoking at the same time. Pt agrees to phase 2 cardiac rehab referral, [facility]. Pt in recliner, call bell within reach.  [phone] Joylene GrapesEmily C Renwick Asman, RN, BSN 07/19/2016 2:05 PM

## 2016-07-19 NOTE — Interval H&P Note (Signed)
History and Physical Interval Note:  07/19/2016 9:37 AM  Jonathan Fischer  has presented today for surgery, with the diagnosis of cp  The various methods of treatment have been discussed with the patient and family. After consideration of risks, benefits and other options for treatment, the patient has consented to  Procedure(s): Left Heart Cath and Coronary Angiography (N/A) as a surgical intervention .  The patient's history has been reviewed, patient examined, no change in status, stable for surgery.  I have reviewed the patient's chart and labs.  Questions were answered to the patient's satisfaction.   Cath Lab Visit (complete for each Cath Lab visit)  Clinical Evaluation Leading to the Procedure:   ACS: Yes.    Non-ACS:    Anginal Classification: CCS III  Anti-ischemic medical therapy: No Therapy  Non-Invasive Test Results: No non-invasive testing performed  Prior CABG: No previous CABG       Theron Aristaeter Reg[facility] Medical CenterJordanMD,FACC 07/19/2016 9:37 AM

## 2016-07-19 NOTE — Progress Notes (Addendum)
Dr SwazilandJordan notified of client c/o 3/10 mid chest burning and no new orders and [facility]Jordan [facility]

## 2016-07-20 NOTE — Progress Notes (Signed)
07/20/2016 at 1745  During Pt's follow up phone call, pt verbalized that he was having lower chest/upper stomach pain only when swallowing. He states this pain started as soon as he got home from the hospital.   Pt denies any other symptoms such as dizziness diaphoresis, or radiating chest pain. I encouraged pt to call his cardiologist to notify him of this pain. Pt states he has an appointment with his primary Dr in am and he will tell the primary Dr then. Pt states he has had 7 heart attacks and it's not the same  And he is not worried.    I encouraged him to let his cardiologist know now and let the cardiologist decide if it was something he should be concerned about.  Pt states he "aint dead yet so it can wait til the morning" Pt denies bleeding or any other complaints.

## 2016-07-21 ENCOUNTER — Telehealth: Payer: Self-pay | Admitting: Internal Medicine

## 2016-07-21 DIAGNOSIS — I213 ST elevation (STEMI) myocardial infarction of unspecified site: Secondary | ICD-10-CM | POA: Diagnosis not present

## 2016-07-21 DIAGNOSIS — I10 Essential (primary) hypertension: Secondary | ICD-10-CM | POA: Diagnosis not present

## 2016-07-21 DIAGNOSIS — I2510 Atherosclerotic heart disease of native coronary artery without angina pectoris: Secondary | ICD-10-CM | POA: Diagnosis not present

## 2016-07-21 DIAGNOSIS — I472 Ventricular tachycardia: Secondary | ICD-10-CM | POA: Diagnosis not present

## 2016-07-21 MED FILL — Verapamil HCl IV Soln 2.5 MG/ML: INTRAVENOUS | Qty: 2 | Status: AC

## 2016-07-21 NOTE — Telephone Encounter (Signed)
Left msg to call.

## 2016-07-21 NOTE — Telephone Encounter (Signed)
NEw Message  Pt wife call requesting to speak with RN. Pt wife states pt is now having some chest discomfort after having CATH procedure. Please call back to discuss

## 2016-07-21 NOTE — Telephone Encounter (Signed)
Yes I would take protonix daily  Reznor Ferrando SwazilandJordan MD, [facility] WellnessFACC

## 2016-07-21 NOTE — Telephone Encounter (Signed)
Pt of Dr. SwazilandJordan - recent cath in hospital Monday  Received call from wife. Notes since procedure , patient has had discomfort and "a little pain" in chest after eating. Pain is only after eating something, he describes as in chest and radiating to back.  Notes when he was being prepped for procedure and got plavix dose prior to cath on Monday, he had onset of bad reflux - not noted to be a concern prior to procedure.   After calling PCP office w same concern, patient obtained rx for Protonix & wanted to know if this should help. Has not taken yet. Advised to take this and see if symptoms improve, call us if still concerns in 2-3 days.   Routed to Dr. SwazilandJordan for review.

## 2016-07-21 NOTE — Telephone Encounter (Signed)
Follow up       Wife is calling to talk to the nurse----pt recently had a cath and is having chest discomfort when he swallows.  Please call before you leave

## 2016-07-26 NOTE — Telephone Encounter (Signed)
Spoke to patient he stated he is feeling much better,no chest pain.Dr.Jordan advised to continue protonix daily.Advised to keep appointment as planned.

## 2016-07-27 ENCOUNTER — Encounter: Payer: Self-pay | Admitting: Internal Medicine

## 2016-07-28 NOTE — Progress Notes (Signed)
Jonathan Fischer Date of Birth: [birth year] Medical Record #[id number]  History of Present Illness: Jonathan Fischer is seen back today for post hospital follow up.    He  has extensive CAD. Has an ischemic CM with EF of 25 to 30%. Has an ICD in place. Has had multiple MIs and multiple PCIs.   He reports his first stent(s) was 10 years ago at [facility]. He returned 6 months later and had another stent placed. He had a  STEMI in December 2013 with occluded proximal LAD treated with repeat DES of the proximal LAD (occluded at the site of the prior stent). Stent in the LCX was patent. Stent in the mid RCA was patent. EF 25 to 30%. Troponin over 20. Echo shoed EF of 25 to 30% as well, septal apical and anterior wall AK and possible small layer of mural apical thrombus without mobility. Not felt to be a candidate for further anticoagulation due to his noncompliance.   He was readmitted in October 2014 with an ICD discharge. Interrogation confirmed this was ventricular tachycardia. He had normal cardiac enzymes. Potassium was mildly decreased. He had no evidence of increased congestive heart failure. We recommended continued medical therapy with beta blocker and potassium repletion. Echo showed ejection fraction was in the range of 30% to 35%. Akinesis of the mid-distalanteroseptal myocardium. Myoview showed a large anteroseptal scar without ischemia. ejection fraction was in the range of 30% to 35%. Akinesis of the mid-distalanteroseptal myocardium.  ICD interrogation and followup showed no recurrent episodes of ventricular tachycardia. His last remote ICD transmission was in August 2016. He has not seen Dr. Graciela Husbands since May 2015.   Was seen last month with symptoms of unstable angina. Was off all medication for 2 years. Underwent cardiac cath that showed focal in stent restenosis in RCA treated successfully with cutting balloon angioplasty. Medication resumed including ASA, plavix, lisinopril, lipitor, and  Toprol.   On follow up today he is feeling much better. Seeing Dr. Selena Batten for his diabetes and is on Trulicity. Following a low carb diet. Walking 1-2 miles per day. Anginal symptoms resolved. Still smoking > 1ppd. Tolerating medication well. Seems to be taking his health more seriously now.   Current Outpatient Prescriptions on File Prior to Visit  Medication Sig Dispense Refill  . aspirin EC 81 MG EC tablet Take 1 tablet (81 mg total) by mouth daily. (Patient taking differently: Take 81 mg by mouth every evening. ) 30 tablet 3  . atorvastatin (LIPITOR) 80 MG tablet Take 1 tablet (80 mg total) by mouth daily at 6 PM. 30 tablet 3  . [START ON 08/11/2016] clopidogrel (PLAVIX) 75 MG tablet Take 1 tablet (75 mg total) by mouth daily with breakfast. 90 tablet 3  . [START ON 08/11/2016] lisinopril (PRINIVIL,ZESTRIL) 10 MG tablet Take 1 tablet (10 mg total) by mouth daily. 90 tablet 3  . metoprolol succinate (TOPROL-XL) 25 MG 24 hr tablet Take 1 tablet (25 mg total) by mouth daily. (Patient taking differently: Take 25 mg by mouth every evening. ) 30 tablet 3  . nitroGLYCERIN (NITROSTAT) 0.4 MG SL tablet Place 1 tablet (0.4 mg total) under the tongue every 5 (five) minutes as needed. For chest pain 25 tablet 3  . oxymetazoline (AFRIN) 0.05 % nasal spray Place 1 spray into both nostrils 4 (four) times daily as needed for congestion.     No current facility-administered medications on file prior to visit.     No Known Allergies  Past Medical  History:  Diagnosis Date  . Abnormal echocardiogram    a. 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  . AUTOMATIC IMPLANTABLE CARDIAC DEFIBRILLATOR SITU 03/03/2010   Qualifier: Diagnosis of  By: Graciela Husbands, MD, Ruthann Cancer Ty Hilts   . CAD (coronary artery disease)    s/p prior LAD, LCx and RCA stenting remotely for an anterior MI, recurrent MI in 2010 with LAD stent  occlusion s/p thrombectomy and PTCA, STEMI 12/2011 s/p DES-prox LAD  . Cardiomyopathy, ischemic 12/14/2011  . Chronic systolic CHF (congestive heart failure) (HCC)   . HTN (hypertension)   . Hyperlipidemia   . ICD (implantable cardiac defibrillator) in place   . Ischemic cardiomyopathy    EF 25-30% s/p single-chamber Medtronic ICD   . NSVT (nonsustained ventricular tachycardia) (HCC) 12/14/2011  . Obesity   . STEMI (ST elevation myocardial infarction) (HCC) 12/12/2011  . Tobacco abuse   . TOBACCO USER 10/29/2009   Qualifier: Diagnosis of  By: Graciela Husbands, MD, Susie Cassette   . Tooth pain 12/14/2011  . VT (ventricular tachycardia) (HCC) 10/18/2012    Past Surgical History:  Procedure Laterality Date  . CARDIAC CATHETERIZATION  03/31/2009  . CARDIAC CATHETERIZATION  05/08/2008   CARDIAC SIZE AND SILHOUETTE NORMAL. THERE WAS ANTERIOR APICAL HYPOKINESIS WITH EF 35-40%  . CARDIAC DEFIBRILLATOR PLACEMENT  11/2009  . CORONARY ANGIOPLASTY     BALLOON ANGIOPLASTY TO THE LAD WITH THROMBUS EXTRACTION OF THE PREVIOUSLY STENTED SEGMENT AND NEW STENT PLACEMENT TO THE LEFT CIRCUMFLEX. EF IS DOWN IN THE 30% RANGE  . CORONARY ANGIOPLASTY WITH STENT PLACEMENT  12/12/2011   30% mid LCx, mid RCA & mid LCx stents patent. LVEF 25-30%, mid-distal anterolateral wall AK, apex dilated, dyskinetic s/p DES-prox LAD stenosis.  . CORONARY BALLOON ANGIOPLASTY N/A 07/19/2016   Procedure: Coronary Balloon Angioplasty;  Surgeon: Swaziland, Deaundra Dupriest M, MD;  [facility] INVASIVE CV LAB;  Service: Cardiovascular;  Laterality: N/A;  . defib    . LEFT HEART CATH Bilateral 12/12/2011   Procedure: LEFT HEART CATH;  Surgeon: Kristalynn Coddington M Swaziland, MD;  Location: [facility] CATH LAB;  Service: Cardiovascular;  Laterality: Bilateral;  . LEFT HEART CATH AND CORONARY ANGIOGRAPHY N/A 07/19/2016   Procedure: Left Heart Cath and Coronary Angiography;  Surgeon: Swaziland, Lorry Anastasi M, MD;  Location: [facility] INVASIVE CV LAB;  Service: Cardiovascular;  Laterality: N/A;     History  Smoking Status  . Current Every Day Smoker  . Packs/day: 1.50  . Years: 24.00  . Types: E-cigarettes  Smokeless Tobacco  . Current User    History  Alcohol Use  . Yes    Comment: Occassionally.    Family History  Problem Relation Age of Onset  . Heart disease Father   . Cancer Mother     Review of Systems: The review of systems is per the HPI.  All other systems were reviewed and are negative.  Physical Exam: BP 128/82   Pulse 64   Ht 6\' 1"  (1.854 m)   Wt 268 lb 9.6 oz (121.8 kg)   BMI 35.44 kg/m  Patient is an obese WM in no acute distress.   Skin is warm and dry. Color is normal.  HEENT is unremarkable. Normocephalic/atraumatic. PERRL. Sclera are nonicteric. Neck is supple. No masses. No JVD. Lungs are clear. Cardiac exam shows a regular rate and rhythm.normal S1-2 without gallop or murmur. Abdomen is soft. Extremities are without edema. Radial cath site without  hematoma. Pulses are 2+ and equal.  Gait and ROM are intact. No gross neurologic deficits noted.   LABORATORY DATA:  Lab Results  Component Value Date   WBC 7.4 07/13/2016   HGB 17.5 07/13/2016   HCT 49.8 07/13/2016   PLT 200 07/13/2016   GLUCOSE 169 (H) 07/13/2016   CHOL 228 (H) 07/13/2016   TRIG 559 (HH) 07/13/2016   HDL 23 (L) 07/13/2016   LDLCALC Comment 07/13/2016   ALT 47 (H) 07/13/2016   AST 31 07/13/2016   NA 135 07/13/2016   K 4.6 07/13/2016   CL 96 07/13/2016   CREATININE 0.83 07/13/2016   BUN 13 07/13/2016   CO2 23 07/13/2016   TSH 0.633 12/12/2011   INR 1.0 07/13/2016   HGBA1C 7.6 (H) 07/13/2016   Lab Results  Component Value Date   CKTOTAL 209 10/19/2011   CKMB 1.8 10/19/2011   TROPONINI <0.30 09/15/2013   Ecg today shows NSR with rate 67. Old anterior infarct. No acute change. I have personally reviewed and interpreted this study.  ICD interrogation today shows battery voltage of 2.9 volts. 6 episodes of NSVT. No device therapies.   Cardiac cath 07/19/16:  Conclusion     Prox RCA to Mid RCA lesion, 0 %stenosed.  Prox Cx to Mid Cx lesion, 0 %stenosed.  Prox Cx lesion, 30 %stenosed.  Prox LAD to Mid LAD lesion, 0 %stenosed.  Dist LAD lesion, 60 %stenosed.  The left ventricular ejection fraction is 25-35% by visual estimate.  There is severe left ventricular systolic dysfunction.  LV end diastolic pressure is mildly elevated.  Mid RCA lesion, 95 %stenosed.  Post intervention, there is a 0% residual stenosis.   1. Single vessel obstructive CAD with focal instent restenosis in the mid RCA 2. Severe LV dysfunction. EF 30-35%. 3. Mildly elevated LVEDP  Plan: Continue DAPT for one year (due to increased CV risk and USAP). Patient is a candidate for same day discharge. Aggressive risk factor modification.       Assessment / Plan:  1. CAD with extensive history of CAD with multiple PCIs including LAD, LCx, RCA. S/p  STEMI/repeat DES to the LAD in Dec 2013. No ischemia noted on Myoview study in 2014. Extensive anterior apical scar. Presented recently with progressive unstable angina. Found to have severe in stent restenosis in mid RCA treated with cutting balloon angioplasty. Will continue DAPT for one year due to increased risk of restenosis even though a stent was not placed.   2. Ischemic CM - EF is 30-35 %. No overt CHF.  Continue Toprol XL and lisinopril. Follow up in 3 months and consider adding aldactone at that time. Will repeat Echo once medications optimized.   3. HLD-  Resume lipitor 80 mg daily. Discussed heart healthy diet with efforts at weight loss.  4. Ongoing tobacco abuse - Has failed Chantix and Wellbutrin in the past. He reports he is trying to quit.  5. Ventricular tachycardia. No sustained recurrence. Will resume beta blocker therapy. ICD interrogated today in pacer clinic. Continue follow up in device clinic.  6. DM type 2- per Dr. Selena BattenKim  7. Noncompliance. Discussed importance of medical therapy to reduce  future CV events or CHF progression. Motivation is better but still needs reinforcement.

## 2016-08-01 NOTE — Progress Notes (Signed)
Cardiology Office Note Date:  08/02/2016  Patient ID:  Jonathan Fischer, DOB 1970-03-12, MRN [id number] PCP:  Pearson GrippeKim, James, MD  Cardiologist:  Dr. SwazilandJordan   Chief Complaint: overdue device visit  History of Present Illness: Jonathan Fischer is a 46 y.o. male with history of significant CAD with reports of multiple MI's and PCI's, ICM w/ICD, chronic CHF (systolic), HTN, HLD, was lost to f/u for a couple years, last seen by Dr. SwazilandJordan earlier this month (prior to that was 2014, and had stopped all of his medicines for the last couple years), with increasing c/o CP underwent cardiac cath 07/19/16 with DES/PCI to RCA, planned for aggressive medical therapy and a year of DAPT.  He last saw Dr. Graciela HusbandsKlein in 2015.   He is feeling very well, no CP, palpitations, or SOB, he has started walking for exercise, no DOE, and states since his PCI and getting started on DM management he has felt very well.  No near syncope or syncope, no shocks from his device  We discussed the importance of his medicines and compliance, all of them though particularly his ASA/Plavix with new stent, he states Dr. SwazilandJordan discussed with him and has been.  Discussed importance of diet and exercise, and his wife states since beng dx with DM he has made significant improvements in both.  Device information. MDT single chamber ICD implanted 11/12/09, Dr. Ladona Ridgelaylor, follows w/Dr. Graciela HusbandsKlein The patient reports a shock a few years ago, reportedly appropriately  Past Medical History:  Diagnosis Date  . Abnormal echocardiogram    a. 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  . AUTOMATIC IMPLANTABLE CARDIAC DEFIBRILLATOR SITU 03/03/2010   Qualifier: Diagnosis of  By: Graciela HusbandsKlein, MD, Ruthann CancerFACC, Ty HiltsSteven Cochran   . CAD (coronary artery disease)    s/p prior LAD, LCx and RCA stenting remotely for an anterior MI, recurrent MI in 2010 with LAD stent  occlusion s/p thrombectomy and PTCA, STEMI 12/2011 s/p DES-prox LAD  . Cardiomyopathy, ischemic 12/14/2011  . Chronic systolic CHF (congestive heart failure) (HCC)   . HTN (hypertension)   . Hyperlipidemia   . ICD (implantable cardiac defibrillator) in place   . Ischemic cardiomyopathy    EF 25-30% s/p single-chamber Medtronic ICD   . NSVT (nonsustained ventricular tachycardia) (HCC) 12/14/2011  . Obesity   . STEMI (ST elevation myocardial infarction) (HCC) 12/12/2011  . Tobacco abuse   . TOBACCO USER 10/29/2009   Qualifier: Diagnosis of  By: Graciela HusbandsKlein, MD, Susie CassetteFACC, Steven Cochran   . Tooth pain 12/14/2011  . VT (ventricular tachycardia) (HCC) 10/18/2012    Past Surgical History:  Procedure Laterality Date  . CARDIAC CATHETERIZATION  03/31/2009  . CARDIAC CATHETERIZATION  05/08/2008   CARDIAC SIZE AND SILHOUETTE NORMAL. THERE WAS ANTERIOR APICAL HYPOKINESIS WITH EF 35-40%  . CARDIAC DEFIBRILLATOR PLACEMENT  11/2009  . CORONARY ANGIOPLASTY     BALLOON ANGIOPLASTY TO THE LAD WITH THROMBUS EXTRACTION OF THE PREVIOUSLY STENTED SEGMENT AND NEW STENT PLACEMENT TO THE LEFT CIRCUMFLEX. EF IS DOWN IN THE 30% RANGE  . CORONARY ANGIOPLASTY WITH STENT PLACEMENT  12/12/2011   30% mid LCx, mid RCA & mid LCx stents patent. LVEF 25-30%, mid-distal anterolateral wall AK, apex dilated, dyskinetic s/p DES-prox LAD stenosis.  . CORONARY BALLOON ANGIOPLASTY N/A 07/19/2016   Procedure: Coronary Balloon Angioplasty;  Surgeon: SwazilandJordan, Peter M, MD;  Location[facility] INVASIVE CV LAB;  Service: Cardiovascular;  Laterality: N/A;  . defib    . LEFT HEART CATH Bilateral 12/12/2011   Procedure: LEFT HEART CATH;  Surgeon: Peter M Swaziland, MD;  Location[facility] CATH LAB;  Service: Cardiovascular;  Laterality: Bilateral;  . LEFT HEART CATH AND CORONARY ANGIOGRAPHY N/A 07/19/2016   Procedure: Left Heart Cath and Coronary Angiography;  Surgeon: Swaziland, Peter M, MD;  Location: [facility] INVASIVE CV LAB;  Service: Cardiovascular;  Laterality: N/A;     Current Outpatient Prescriptions  Medication Sig Dispense Refill  . aspirin EC 81 MG EC tablet Take 1 tablet (81 mg total) by mouth daily. (Patient taking differently: Take 81 mg by mouth every evening. ) 30 tablet 3  . atorvastatin (LIPITOR) 80 MG tablet Take 1 tablet (80 mg total) by mouth daily at 6 PM. 30 tablet 3  . clopidogrel (PLAVIX) 75 MG tablet Take 1 tablet (75 mg total) by mouth daily. 30 tablet 0  . [START ON 08/11/2016] clopidogrel (PLAVIX) 75 MG tablet Take 1 tablet (75 mg total) by mouth daily with breakfast. 90 tablet 3  . Dulaglutide (TRULICITY) 0.75 MG/0.5ML SOPN Inject 1 Dose into the skin once a week.    Melene Muller ON 08/11/2016] lisinopril (PRINIVIL,ZESTRIL) 10 MG tablet Take 1 tablet (10 mg total) by mouth daily. 90 tablet 3  . lisinopril (PRINIVIL,ZESTRIL) 10 MG tablet Take 1 tablet (10 mg total) by mouth daily. 30 tablet 0  . metoprolol succinate (TOPROL-XL) 25 MG 24 hr tablet Take 1 tablet (25 mg total) by mouth daily. (Patient taking differently: Take 25 mg by mouth every evening. ) 30 tablet 3  . nitroGLYCERIN (NITROSTAT) 0.4 MG SL tablet Place 1 tablet (0.4 mg total) under the tongue every 5 (five) minutes as needed. For chest pain 25 tablet 3  . oxymetazoline (AFRIN) 0.05 % nasal spray Place 1 spray into both nostrils 4 (four) times daily as needed for congestion.    . pantoprazole (PROTONIX) 40 MG tablet Take 40 mg by mouth daily.  5   No current facility-administered medications for this visit.     Allergies:   Patient has no known allergies.   Social History:  The patient  reports that he has been smoking E-cigarettes.  He has a 36.00 pack-year smoking history. He uses smokeless tobacco. He reports that he drinks alcohol. He reports that he does not use drugs.   Family History:  The patient's family history includes Cancer in his mother; Heart disease in his father.  ROS:  Please see the history of present illness.    All other systems are reviewed and  otherwise negative.   PHYSICAL EXAM:  VS:  BP (!) 106/58   Pulse 66   Ht 6\' 1"  (1.854 m)   Wt 268 lb (121.6 kg)   SpO2 95%   BMI 35.36 kg/m  BMI: Body mass index is 35.36 kg/m. Well nourished, well developed, in no acute distress  HEENT: normocephalic, atraumatic  Neck: no JVD, carotid bruits or masses Cardiac:  RRR; no significant murmurs, no rubs, or gallops Lungs:  CTA b/l, no wheezing, rhonchi or rales  Abd: soft, nontender MS: no deformity or atrophy Ext: no edema  Skin: warm and dry, no rash Neuro:  No gross deficits appreciated Psych: euthymic mood, full affect  ICD site is stable, no tethering or discomfort   EKG:  Done 07/19/16 is SR, PR , QRS 94ms, QTc ICD interrogation done today by indusctry and reviewed by myself: battery and lead measurements are good, no observations,  100% VS   Coronary Balloon Angioplasty  07/19/16, Dr. Swaziland  Left Heart Cath and Coronary Angiography  Conclusion    Prox RCA to Mid RCA lesion, 0 %stenosed.  Prox Cx to Mid Cx lesion, 0 %stenosed.  Prox Cx lesion, 30 %stenosed.  Prox LAD to Mid LAD lesion, 0 %stenosed.  Dist LAD lesion, 60 %stenosed.  The left ventricular ejection fraction is 25-35% by visual estimate.  There is severe left ventricular systolic dysfunction.  LV end diastolic pressure is mildly elevated.  Mid RCA lesion, 95 %stenosed.  Post intervention, there is a 0% residual stenosis.  1. Single vessel obstructive CAD with focal instent restenosis in the mid RCA 2. Severe LV dysfunction. EF 30-35%. 3. Mildly elevated LVEDP     Recent Labs: 07/13/2016: ALT 47; BUN 13; Creatinine, Ser 0.83; Hemoglobin 17.5; Platelets 200; Potassium 4.6; Sodium 135  07/13/2016: Chol/HDL Ratio 9.9; Cholesterol, Total 228; HDL 23; LDL Calculated Comment; Triglycerides 559   Estimated Creatinine Clearance: 155.2 mL/min (by C-G formula based on SCr of 0.83 mg/dL).   Wt Readings from Last 3 Encounters:  08/02/16 268 lb  (121.6 kg)  07/19/16 276 lb (125.2 kg)  07/13/16 276 lb (125.2 kg)     Other studies reviewed: Additional studies/records reviewed today include: summarized above  ASSESSMENT AND PLAN:  1. ICM w/ICD     Stable battery and lead measurements no observations      2. CAD, multiple prior MI's/PCI's, last was DES to RCA 07/19/16     Feels great, discussed importance of medicine compliance, he understands     On ASA/Plavix, statin, BB  3. Chronic CHF (systolic)     Exam is euvolemic, weight is stable, discussed avoiding salt     On BB/ACE  4. HTN     Looks good, no changes  5. Smoking     He has made great strides and smoking much less, working on it   Disposition: F/u with Dr. Swaziland as directed by hiim, we have started getting remote checks, will continue Q 3 month remote device checks, and 1 year with dr. Eilene Ghazi, sooner if needed, pending battery status  Current medicines are reviewed at length with the patient today.  The patient did not have any concerns regarding medicines.  Judith Blonder, PA-C 08/02/2016 8:59 AM     [facility] [address] [place] [address] [phone] (office)  [phone] (fax)

## 2016-08-02 ENCOUNTER — Encounter: Payer: Self-pay | Admitting: Physician Assistant

## 2016-08-02 ENCOUNTER — Ambulatory Visit (INDEPENDENT_AMBULATORY_CARE_PROVIDER_SITE_OTHER): Payer: Medicare HMO | Admitting: Cardiology

## 2016-08-02 ENCOUNTER — Encounter: Payer: Self-pay | Admitting: Cardiology

## 2016-08-02 ENCOUNTER — Ambulatory Visit (INDEPENDENT_AMBULATORY_CARE_PROVIDER_SITE_OTHER): Payer: Medicare HMO | Admitting: Physician Assistant

## 2016-08-02 ENCOUNTER — Ambulatory Visit (INDEPENDENT_AMBULATORY_CARE_PROVIDER_SITE_OTHER): Payer: Medicare HMO | Admitting: *Deleted

## 2016-08-02 VITALS — BP 128/82 | HR 64 | Ht 73.0 in | Wt 268.6 lb

## 2016-08-02 VITALS — BP 106/58 | HR 66 | Ht 73.0 in | Wt 268.0 lb

## 2016-08-02 DIAGNOSIS — Z95810 Presence of automatic (implantable) cardiac defibrillator: Secondary | ICD-10-CM | POA: Diagnosis not present

## 2016-08-02 DIAGNOSIS — I5022 Chronic systolic (congestive) heart failure: Secondary | ICD-10-CM

## 2016-08-02 DIAGNOSIS — I2510 Atherosclerotic heart disease of native coronary artery without angina pectoris: Secondary | ICD-10-CM | POA: Diagnosis not present

## 2016-08-02 DIAGNOSIS — I255 Ischemic cardiomyopathy: Secondary | ICD-10-CM | POA: Diagnosis not present

## 2016-08-02 DIAGNOSIS — Z720 Tobacco use: Secondary | ICD-10-CM

## 2016-08-02 DIAGNOSIS — E7800 Pure hypercholesterolemia, unspecified: Secondary | ICD-10-CM | POA: Diagnosis not present

## 2016-08-02 DIAGNOSIS — E118 Type 2 diabetes mellitus with unspecified complications: Secondary | ICD-10-CM | POA: Diagnosis not present

## 2016-08-02 DIAGNOSIS — Z9861 Coronary angioplasty status: Secondary | ICD-10-CM | POA: Diagnosis not present

## 2016-08-02 DIAGNOSIS — I25110 Atherosclerotic heart disease of native coronary artery with unstable angina pectoris: Secondary | ICD-10-CM | POA: Diagnosis not present

## 2016-08-02 LAB — CUP PACEART INCLINIC DEVICE CHECK
Brady Statistic RV Percent Paced: 0.01 %
HIGH POWER IMPEDANCE MEASURED VALUE: 52 Ohm
HIGH POWER IMPEDANCE MEASURED VALUE: 73 Ohm
HighPow Impedance: 380 Ohm
Implantable Lead Implant Date: 20111102
Lead Channel Impedance Value: 456 Ohm
Lead Channel Sensing Intrinsic Amplitude: 11.25 mV
Lead Channel Sensing Intrinsic Amplitude: 14 mV
Lead Channel Setting Pacing Amplitude: 2.5 V
MDC IDC LEAD LOCATION: 753860
MDC IDC MSMT BATTERY VOLTAGE: 2.88 V
MDC IDC PG IMPLANT DT: 20111102
MDC IDC SESS DTM: 20180723092803
MDC IDC SET LEADCHNL RV PACING PULSEWIDTH: 0.4 ms
MDC IDC SET LEADCHNL RV SENSING SENSITIVITY: 0.3 mV

## 2016-08-02 NOTE — Patient Instructions (Addendum)
Medication Instructions:  Your physician recommends that you continue on your current medications as directed. Please refer to the Current Medication list given to you today.   Labwork: None Ordered   Testing/Procedures: None Ordered   Follow-Up: Your physician recommends that you schedule a follow-up appointment in: 3 months home remote device check 11/01/16  Your physician wants you to follow-up in: 1 year with Dr. Graciela HusbandsKlein. You will receive a reminder letter in the mail two months in advance. If you don't receive a letter, please call our office to schedule the follow-up appointment.    Any Other Special Instructions Will Be Listed Below (If Applicable).     If you need a refill on your cardiac medications before your next appointment, please call your pharmacy.

## 2016-08-02 NOTE — Progress Notes (Signed)
ICD check in clinic. Normal device function. Threshold and sensing consistent with previous device measurements. Impedance trend stable over time. No evidence of any ventricular arrhythmias. Histogram distribution appropriate for patient and level of activity. No changes made this session. Device programmed at appropriate safety margins. Device programmed to optimize intrinsic conduction. Battery voltage 2.88V ( ERI 2.61V)Pt enrolled in remote follow-up. Remote check 10/22

## 2016-08-02 NOTE — Patient Instructions (Signed)
Continue your current therapy   I will see you in 3 months. 

## 2016-10-30 NOTE — Progress Notes (Signed)
   Bess HarvestLarry E Bores II Date of Birth: 1970/05/26 Medical Record #[id number]   Erroneous encounter

## 2016-11-01 ENCOUNTER — Telehealth: Payer: Self-pay | Admitting: Cardiology

## 2016-11-01 ENCOUNTER — Ambulatory Visit (INDEPENDENT_AMBULATORY_CARE_PROVIDER_SITE_OTHER): Payer: Medicare HMO | Admitting: *Deleted

## 2016-11-01 DIAGNOSIS — I255 Ischemic cardiomyopathy: Secondary | ICD-10-CM

## 2016-11-01 NOTE — Telephone Encounter (Signed)
Spoke with pt and reminded pt of remote transmission that is due today. Pt verbalized understanding.   

## 2016-11-03 ENCOUNTER — Encounter: Payer: Medicare HMO | Admitting: Cardiology

## 2016-11-03 ENCOUNTER — Encounter: Payer: Self-pay | Admitting: Cardiology

## 2016-11-03 LAB — CUP PACEART REMOTE DEVICE CHECK
Date Time Interrogation Session: 20181023025404
HIGH POWER IMPEDANCE MEASURED VALUE: 57 Ohm
HIGH POWER IMPEDANCE MEASURED VALUE: 81 Ohm
HighPow Impedance: 380 Ohm
Implantable Lead Implant Date: 20111102
Lead Channel Impedance Value: 494 Ohm
Lead Channel Sensing Intrinsic Amplitude: 13.75 mV
Lead Channel Setting Pacing Amplitude: 2.5 V
MDC IDC LEAD LOCATION: 753860
MDC IDC MSMT BATTERY VOLTAGE: 2.83 V
MDC IDC MSMT LEADCHNL RV SENSING INTR AMPL: 13.75 mV
MDC IDC PG IMPLANT DT: 20111102
MDC IDC SET LEADCHNL RV PACING PULSEWIDTH: 0.4 ms
MDC IDC SET LEADCHNL RV SENSING SENSITIVITY: 0.3 mV
MDC IDC STAT BRADY RV PERCENT PACED: 0.04 %

## 2016-11-03 NOTE — Progress Notes (Signed)
Remote ICD transmission.   

## 2016-11-04 ENCOUNTER — Encounter: Payer: Self-pay | Admitting: *Deleted

## 2016-11-05 ENCOUNTER — Encounter: Payer: Self-pay | Admitting: Cardiology

## 2017-01-31 ENCOUNTER — Telehealth: Payer: Self-pay | Admitting: Cardiology

## 2017-01-31 ENCOUNTER — Ambulatory Visit: Payer: Medicare HMO | Admitting: *Deleted

## 2017-01-31 NOTE — Telephone Encounter (Signed)
Spoke with pt and reminded pt of remote transmission that is due today. Pt verbalized understanding.   

## 2017-02-03 ENCOUNTER — Encounter: Payer: Self-pay | Admitting: Cardiology

## 2017-02-03 NOTE — Progress Notes (Signed)
Opened in error

## 2017-02-07 ENCOUNTER — Ambulatory Visit (INDEPENDENT_AMBULATORY_CARE_PROVIDER_SITE_OTHER): Payer: Medicare HMO | Admitting: *Deleted

## 2017-02-07 DIAGNOSIS — I255 Ischemic cardiomyopathy: Secondary | ICD-10-CM

## 2017-02-07 NOTE — Progress Notes (Signed)
Remote ICD transmission.   

## 2017-02-08 LAB — CUP PACEART REMOTE DEVICE CHECK
Battery Voltage: 2.74 V
Brady Statistic RV Percent Paced: 0.01 %
Date Time Interrogation Session: 20190125234006
HIGH POWER IMPEDANCE MEASURED VALUE: 399 Ohm
HIGH POWER IMPEDANCE MEASURED VALUE: 84 Ohm
HighPow Impedance: 58 Ohm
Implantable Pulse Generator Implant Date: 20111102
Lead Channel Sensing Intrinsic Amplitude: 20.75 mV
Lead Channel Sensing Intrinsic Amplitude: 20.75 mV
Lead Channel Setting Sensing Sensitivity: 0.3 mV
MDC IDC LEAD IMPLANT DT: 20111102
MDC IDC LEAD LOCATION: 753860
MDC IDC MSMT LEADCHNL RV IMPEDANCE VALUE: 494 Ohm
MDC IDC SET LEADCHNL RV PACING AMPLITUDE: 2.5 V
MDC IDC SET LEADCHNL RV PACING PULSEWIDTH: 0.4 ms

## 2017-02-10 ENCOUNTER — Encounter: Payer: Self-pay | Admitting: Cardiology

## 2017-03-02 LAB — CUP PACEART INCLINIC DEVICE CHECK
HighPow Impedance: 380 Ohm
HighPow Impedance: 53 Ohm
HighPow Impedance: 75 Ohm
Implantable Lead Implant Date: 20111102
Implantable Lead Location: 753860
Implantable Lead Model: 6947
Implantable Pulse Generator Implant Date: 20111102
Lead Channel Setting Pacing Amplitude: 2.5 V
MDC IDC MSMT BATTERY VOLTAGE: 2.9 V
MDC IDC MSMT LEADCHNL RV IMPEDANCE VALUE: 494 Ohm
MDC IDC MSMT LEADCHNL RV SENSING INTR AMPL: 15.25 mV
MDC IDC MSMT LEADCHNL RV SENSING INTR AMPL: 15.25 mV
MDC IDC SESS DTM: 20180703140308
MDC IDC SET LEADCHNL RV PACING PULSEWIDTH: 0.4 ms
MDC IDC SET LEADCHNL RV SENSING SENSITIVITY: 0.3 mV
MDC IDC STAT BRADY RV PERCENT PACED: 0.01 %

## 2017-03-11 ENCOUNTER — Telehealth: Payer: Self-pay | Admitting: *Deleted

## 2017-05-09 ENCOUNTER — Telehealth: Payer: Self-pay | Admitting: Cardiology

## 2017-05-09 ENCOUNTER — Encounter: Payer: Medicare HMO | Admitting: *Deleted

## 2017-05-09 NOTE — Telephone Encounter (Signed)
Spoke with pt and reminded pt of remote transmission that is due today. Pt verbalized understanding.   

## 2017-05-12 ENCOUNTER — Encounter: Payer: Self-pay | Admitting: Cardiology

## 2017-05-17 ENCOUNTER — Ambulatory Visit (INDEPENDENT_AMBULATORY_CARE_PROVIDER_SITE_OTHER): Payer: Medicare HMO | Admitting: *Deleted

## 2017-05-17 DIAGNOSIS — I255 Ischemic cardiomyopathy: Secondary | ICD-10-CM

## 2017-05-18 NOTE — Progress Notes (Signed)
Remote ICD transmission.   

## 2017-05-19 ENCOUNTER — Encounter: Payer: Self-pay | Admitting: Cardiology

## 2017-08-09 LAB — CUP PACEART REMOTE DEVICE CHECK
Date Time Interrogation Session: 20190507210618
HIGH POWER IMPEDANCE MEASURED VALUE: 51 Ohm
HIGH POWER IMPEDANCE MEASURED VALUE: 74 Ohm
HighPow Impedance: 380 Ohm
Implantable Lead Implant Date: 20111102
Implantable Lead Location: 753860
Implantable Pulse Generator Implant Date: 20111102
Lead Channel Impedance Value: 437 Ohm
Lead Channel Sensing Intrinsic Amplitude: 13.375 mV
Lead Channel Sensing Intrinsic Amplitude: 13.375 mV
Lead Channel Setting Pacing Amplitude: 2.5 V
Lead Channel Setting Sensing Sensitivity: 0.3 mV
MDC IDC MSMT BATTERY VOLTAGE: 2.66 V
MDC IDC SET LEADCHNL RV PACING PULSEWIDTH: 0.4 ms
MDC IDC STAT BRADY RV PERCENT PACED: 0.01 %

## 2017-08-16 ENCOUNTER — Encounter: Payer: Medicare HMO | Admitting: *Deleted

## 2017-08-23 ENCOUNTER — Encounter: Payer: Self-pay | Admitting: Cardiology

## 2017-09-20 ENCOUNTER — Other Ambulatory Visit: Payer: Self-pay | Admitting: Cardiology

## 2017-09-20 ENCOUNTER — Other Ambulatory Visit: Payer: Self-pay | Admitting: Physician Assistant

## 2017-09-20 NOTE — Telephone Encounter (Signed)
This is Dr. Jordan's pt. °

## 2017-12-24 DIAGNOSIS — H52209 Unspecified astigmatism, unspecified eye: Secondary | ICD-10-CM | POA: Diagnosis not present

## 2017-12-24 DIAGNOSIS — E119 Type 2 diabetes mellitus without complications: Secondary | ICD-10-CM | POA: Diagnosis not present

## 2017-12-24 DIAGNOSIS — H5203 Hypermetropia, bilateral: Secondary | ICD-10-CM | POA: Diagnosis not present

## 2017-12-24 DIAGNOSIS — H524 Presbyopia: Secondary | ICD-10-CM | POA: Diagnosis not present

## 2018-02-17 ENCOUNTER — Telehealth: Payer: Self-pay | Admitting: Internal Medicine

## 2018-02-17 NOTE — Telephone Encounter (Signed)
New Message    1. Has your device fired? no  2. Is you device beeping? Yes, sound like a doorbell. A ding.  Occurred this morning  3. Are you experiencing draining or swelling at device site? no  4. Are you calling to see if we received your device transmission? Yes, they sent one about twenty minutes ago to see if it read anything.   5. Have you passed out? No, but had a dizzy spell last night.      Please route to Device Clinic Pool

## 2018-02-17 NOTE — Telephone Encounter (Signed)
Spoke w/ pt and informed him that we did receive his remote transmission. Informed him that his device has reached RRT. Informed him that a scheduler will be calling him to schedule an appt w/ MD or NP to discuss the procedure and the procedure will be scheduled for another day. Informed him that he will here the same sound every day at the same time. Pt aware that if it is bothersome he can come into the office to have it turned off but leaving it on will have no effect on the device. Pt verbalized understanding.

## 2018-03-08 ENCOUNTER — Encounter: Payer: Self-pay | Admitting: Internal Medicine

## 2018-03-08 ENCOUNTER — Ambulatory Visit: Payer: Medicare HMO | Admitting: Internal Medicine

## 2018-03-08 DIAGNOSIS — I5022 Chronic systolic (congestive) heart failure: Secondary | ICD-10-CM

## 2018-03-08 DIAGNOSIS — I472 Ventricular tachycardia, unspecified: Secondary | ICD-10-CM

## 2018-03-08 DIAGNOSIS — Z720 Tobacco use: Secondary | ICD-10-CM

## 2018-03-08 DIAGNOSIS — Z95810 Presence of automatic (implantable) cardiac defibrillator: Secondary | ICD-10-CM

## 2018-03-08 DIAGNOSIS — I255 Ischemic cardiomyopathy: Secondary | ICD-10-CM

## 2018-03-08 LAB — BASIC METABOLIC PANEL
BUN/Creatinine Ratio: 17 (ref 9–20)
BUN: 15 mg/dL (ref 6–24)
CALCIUM: 9.5 mg/dL (ref 8.7–10.2)
CHLORIDE: 101 mmol/L (ref 96–106)
CO2: 21 mmol/L (ref 20–29)
CREATININE: 0.9 mg/dL (ref 0.76–1.27)
GFR calc non Af Amer: 102 mL/min/{1.73_m2} (ref >59)
GFR, EST AFRICAN AMERICAN: 118 mL/min/{1.73_m2} (ref >59)
GLUCOSE: 182 mg/dL — AB (ref 65–99)
Potassium: 4.5 mmol/L (ref 3.5–5.2)
Sodium: 139 mmol/L (ref 134–144)

## 2018-03-08 LAB — CBC
HEMATOCRIT: 48.9 % (ref 37.5–51.0)
HEMOGLOBIN: 17.4 g/dL (ref 13.0–17.7)
MCH: 32.2 pg (ref 26.6–33.0)
MCHC: 35.6 g/dL (ref 31.5–35.7)
MCV: 90 fL (ref 79–97)
PLATELETS: 232 10*3/uL (ref 150–450)
RBC: 5.41 x10E6/uL (ref 4.14–5.80)
RDW: 13 % (ref 11.6–15.4)
WBC: 9.5 10*3/uL (ref 3.4–10.8)

## 2018-03-08 MED ORDER — SACUBITRIL-VALSARTAN 24-26 MG PO TABS: 1.0000 | ORAL_TABLET | Freq: Two times a day (BID) | ORAL | 11 refills | Status: DC

## 2018-03-08 MED ORDER — EMPAGLIFLOZIN 10 MG PO TABS: 10.0000 mg | ORAL_TABLET | Freq: Every day | ORAL | 2 refills | Status: DC

## 2018-03-08 NOTE — Patient Instructions (Addendum)
Medication Instructions:  Your physician has recommended you make the following change in your medication:   1. Stop your lisinopril.  2. After you have been off lisinopril for 36 hours (a day and a half) begin Entresto 24/26mg  tablet, two times per day. 3. Begin Jardience, 10mg  tablet, once daily.  Labwork: You will have labs drawn today: CBC and BMP   Testing/Procedures: Your physician has requested that you have a cardiac catheterization. Cardiac catheterization is used to diagnose and/or treat various heart conditions. Doctors may recommend this procedure for a number of different reasons. The most common reason is to evaluate chest pain. Chest pain can be a symptom of coronary artery disease (CAD), and cardiac catheterization can show whether plaque is narrowing or blocking your heart's arteries. This procedure is also used to evaluate the valves, as well as measure the blood flow and oxygen levels in different parts of your heart. For further information please visit [email or web address] Please follow instruction sheet, as given.   Follow-Up: Your physician recommends that you schedule a follow-up appointment in:   4 weeks after March 3 for follow up with Dr Graciela Husbands.   Any Other Special Instructions Will Be Listed Below (If Applicable).      [facility] CARDIOVASCULAR DIVISION [facility] [address] OFFICE [address] [place] [address] Dept: [phone] Loc: [phone]  Jonathan Fischer  03/08/2018  You are scheduled for a Cardiac Catheterization on Tuesday, March 3 with Dr. Peter Swaziland.  1. Please arrive at the [facility] (Main Entrance A) at [facility]: [address] [place], [place] [address] at 10:30 AM (This time is two hours before your procedure to ensure your preparation). Free valet parking service is available.   Special note: Every effort is made to have your procedure done on time. Please  understand that emergencies sometimes delay scheduled procedures.  2. Diet: Do not eat solid foods after midnight.  The patient may have clear liquids until 5am upon the day of the procedure.  3. Labs: You will need to have blood drawn on Wednesday, February 26 at [facility].   4. Medication instructions in preparation for your procedure:   Contrast Allergy: No  Stop taking your Sherryll Burger on Monday March 2 to prepare for your heart cath.  On the morning of your procedure, take your Aspirin and Plavix and any morning medicines.  You may use sips of water.  5. Plan for one night stay--bring personal belongings. 6. Bring a current list of your medications and current insurance cards. 7. You MUST have a responsible person to drive you home. 8. Someone MUST be with you the first 24 hours after you arrive home or your discharge will be delayed. 9. Please wear clothes that are easy to get on and off and wear slip-on shoes.  Thank you for allowing [place] to care for you!   --  Invasive Cardiovascular services   If you need a refill on your cardiac medications before your next appointment, please call your pharmacy.

## 2018-03-08 NOTE — H&P (View-Only) (Signed)
Patient Care Team: Pearson Grippe, MD as PCP - General (Internal Medicine)   HPI  Jonathan Fischer is a 48 y.o. male Seen in followup for an ICD implanted for complex coronary disease implanted November 2011. History of appropriate therapy for ventricular tachycardia with ICD discharge.  The rhythm strip was reviewed it was a monomorphic tachycardia    Device reached ERI 1/20   DATE TEST EF   12/13 LHC 25-30% LAD t->>stent Cx-S, RCA-S patent  10/14 Myoview 36% Large anterior scar  7/18 LHC 25-30% mRCA 95% med rx   Date Cr K Hgb  7/18 0./83 4.6 17.5         Complains of exertional chest heaviness.  Accompanied by shortness of breath.  Relieved by rest.  Worse over the last 6 months.  Some peripheral edema.  Diet is salt excessive and fluid excessive.  Interval episodes of palpitations;?  Associated with documented VT-nonsustained  No interval syncope.  No ICD discharges.  Continues to smoke.  Daytime somnolence sleep disordered breathing.    Past Medical History:  Diagnosis Date  . Abnormal echocardiogram    a. 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  . AUTOMATIC IMPLANTABLE CARDIAC DEFIBRILLATOR SITU 03/03/2010   Qualifier: Diagnosis of  By: Graciela Husbands, MD, Ruthann Cancer Ty Hilts   . CAD (coronary artery disease)    s/p prior LAD, LCx and RCA stenting remotely for an anterior MI, recurrent MI in 2010 with LAD stent occlusion s/p thrombectomy and PTCA, STEMI 12/2011 s/p DES-prox LAD  . Cardiomyopathy, ischemic 12/14/2011  . Chronic systolic CHF (congestive heart failure) (HCC)   . HTN (hypertension)   . Hyperlipidemia   . ICD (implantable cardiac defibrillator) in place   . Ischemic cardiomyopathy    EF 25-30% s/p single-chamber Medtronic ICD   . NSVT (nonsustained ventricular tachycardia) (HCC) 12/14/2011  . Obesity   . STEMI (ST elevation myocardial infarction)  (HCC) 12/12/2011  . Tobacco abuse   . TOBACCO USER 10/29/2009   Qualifier: Diagnosis of  By: Graciela Husbands, MD, Susie Cassette   . Tooth pain 12/14/2011  . VT (ventricular tachycardia) (HCC) 10/18/2012    Past Surgical History:  Procedure Laterality Date  . CARDIAC CATHETERIZATION  03/31/2009  . CARDIAC CATHETERIZATION  05/08/2008   CARDIAC SIZE AND SILHOUETTE NORMAL. THERE WAS ANTERIOR APICAL HYPOKINESIS WITH EF 35-40%  . CARDIAC DEFIBRILLATOR PLACEMENT  11/2009  . CORONARY ANGIOPLASTY     BALLOON ANGIOPLASTY TO THE LAD WITH THROMBUS EXTRACTION OF THE PREVIOUSLY STENTED SEGMENT AND NEW STENT PLACEMENT TO THE LEFT CIRCUMFLEX. EF IS DOWN IN THE 30% RANGE  . CORONARY ANGIOPLASTY WITH STENT PLACEMENT  12/12/2011   30% mid LCx, mid RCA & mid LCx stents patent. LVEF 25-30%, mid-distal anterolateral wall AK, apex dilated, dyskinetic s/p DES-prox LAD stenosis.  . CORONARY BALLOON ANGIOPLASTY N/A 07/19/2016   Procedure: Coronary Balloon Angioplasty;  Surgeon: Swaziland, Peter M, MD;  Location: [facility] INVASIVE CV LAB;  Service: Cardiovascular;  Laterality: N/A;  . defib    . LEFT HEART CATH Bilateral 12/12/2011   Procedure: LEFT HEART CATH;  Surgeon: Peter M Swaziland, MD;  Location: [facility] CATH LAB;  Service: Cardiovascular;  Laterality: Bilateral;  . LEFT HEART CATH AND CORONARY ANGIOGRAPHY N/A 07/19/2016   Procedure: Left Heart Cath and Coronary Angiography;  Surgeon: Swaziland, Peter M, MD;  Location: [facility] INVASIVE CV LAB;  Service: Cardiovascular;  Laterality:  N/A;    Current Outpatient Medications  Medication Sig Dispense Refill  . aspirin EC 81 MG EC tablet Take 1 tablet (81 mg total) by mouth daily. 30 tablet 3  . atorvastatin (LIPITOR) 80 MG tablet Take 1 tablet (80 mg total) by mouth daily at 6 PM. NEED OV. 90 tablet 3  . clopidogrel (PLAVIX) 75 MG tablet Take 1 tablet (75 mg total) by mouth daily. NEED OV. 90 tablet 0  . lisinopril (PRINIVIL,ZESTRIL) 10 MG tablet Take 1 tablet (10 mg total) by mouth daily. NEED  OV. 90 tablet 0  . metoprolol succinate (TOPROL-XL) 25 MG 24 hr tablet Take 1 tablet (25 mg total) by mouth daily. NEED OV. 90 tablet 0  . nitroGLYCERIN (NITROSTAT) 0.4 MG SL tablet Place 1 tablet (0.4 mg total) under the tongue every 5 (five) minutes as needed. For chest pain 25 tablet 3  . oxymetazoline (AFRIN) 0.05 % nasal spray Place 1 spray into both nostrils 4 (four) times daily as needed for congestion.    . pantoprazole (PROTONIX) 40 MG tablet Take 40 mg by mouth daily.  5   No current facility-administered medications for this visit.     No Known Allergies  Review of Systems negative except from HPI and PMH  Physical Exam BP 126/78   Pulse 70   Ht 6\' 1"  (1.854 m)   Wt 267 lb 3.2 oz (121.2 kg)   SpO2 95%   BMI 35.25 kg/m  Well developed and Morbidly obese in no acute distress HENT normal Neck supple with JVP 7-8 Clear Device pocket well healed; without hematoma or erythema.  There is no tethering  Regular rate and rhythm, no gallop 2/6 murmur Abd-soft with active BS No Clubbing cyanosis   edema Skin-warm and dry A & Oriented  Grossly normal sensory and motor function   ECG demonstrates sinus rhythm at 70 Intervals 14/09/44 Anterior wall MI Q waves V1-V6 Inferior wall MI Q waves 2-3-F  Assessment and  Plan  Ventricular tachycardia    Ischemic cardiomyopathy    Congestive heart failure chronic systolic   TYobacco abuse__ still smoking  Not rady to pull the stopping trigger  Implantable defibrillator-Medtronic    VT-nonsustained-intercurrent-symptomatic  Device has reached ERI.   We have reviewed the benefits and risks of generator replacement.  These include but are not limited to lead fracture and infection.  The patient understands, agrees and is willing to proceed.    With worsening exertional chest discomfort and shortness of breath, will arrange for repeat catheterization with Dr. Garth Bigness.  Given his ongoing cardiomyopathy, we will switch him from  lisinopril--Entresto.  Thereafter, perhaps when he comes in for his catheterization he can be switched from metoprolol--carvedilol.  And we see him for his ICD generator replacement we will anticipate the initiation of spironolactone.  He has diabetes.  Currently not on therapy.  In conjunction with EM-Pharm.D., we will begin him on empagliflozin

## 2018-03-08 NOTE — Progress Notes (Signed)
    Patient Care Team: Kim, James, MD as PCP - General (Internal Medicine)   HPI  Jonathan Fischer is a 48 y.o. male Seen in followup for an ICD implanted for complex coronary disease implanted November 2011. History of appropriate therapy for ventricular tachycardia with ICD discharge.  The rhythm strip was reviewed it was a monomorphic tachycardia    Device reached ERI 1/20   DATE TEST EF   12/13 LHC 25-30% LAD t->>stent Cx-S, RCA-S patent  10/14 Myoview 36% Large anterior scar  7/18 LHC 25-30% mRCA 95% med rx   Date Cr K Hgb  7/18 0./83 4.6 17.5         Complains of exertional chest heaviness.  Accompanied by shortness of breath.  Relieved by rest.  Worse over the last 6 months.  Some peripheral edema.  Diet is salt excessive and fluid excessive.  Interval episodes of palpitations;?  Associated with documented VT-nonsustained  No interval syncope.  No ICD discharges.  Continues to smoke.  Daytime somnolence sleep disordered breathing.    Past Medical History:  Diagnosis Date  . Abnormal echocardiogram    a. 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  . AUTOMATIC IMPLANTABLE CARDIAC DEFIBRILLATOR SITU 03/03/2010   Qualifier: Diagnosis of  By: Klein, MD, FACC, Steven Cochran   . CAD (coronary artery disease)    s/p prior LAD, LCx and RCA stenting remotely for an anterior MI, recurrent MI in 2010 with LAD stent occlusion s/p thrombectomy and PTCA, STEMI 12/2011 s/p DES-prox LAD  . Cardiomyopathy, ischemic 12/14/2011  . Chronic systolic CHF (congestive heart failure) (HCC)   . HTN (hypertension)   . Hyperlipidemia   . ICD (implantable cardiac defibrillator) in place   . Ischemic cardiomyopathy    EF 25-30% s/p single-chamber Medtronic ICD   . NSVT (nonsustained ventricular tachycardia) (HCC) 12/14/2011  . Obesity   . STEMI (ST elevation myocardial infarction)  (HCC) 12/12/2011  . Tobacco abuse   . TOBACCO USER 10/29/2009   Qualifier: Diagnosis of  By: Klein, MD, FACC, Steven Cochran   . Tooth pain 12/14/2011  . VT (ventricular tachycardia) (HCC) 10/18/2012    Past Surgical History:  Procedure Laterality Date  . CARDIAC CATHETERIZATION  03/31/2009  . CARDIAC CATHETERIZATION  05/08/2008   CARDIAC SIZE AND SILHOUETTE NORMAL. THERE WAS ANTERIOR APICAL HYPOKINESIS WITH EF 35-40%  . CARDIAC DEFIBRILLATOR PLACEMENT  11/2009  . CORONARY ANGIOPLASTY     BALLOON ANGIOPLASTY TO THE LAD WITH THROMBUS EXTRACTION OF THE PREVIOUSLY STENTED SEGMENT AND NEW STENT PLACEMENT TO THE LEFT CIRCUMFLEX. EF IS DOWN IN THE 30% RANGE  . CORONARY ANGIOPLASTY WITH STENT PLACEMENT  12/12/2011   30% mid LCx, mid RCA & mid LCx stents patent. LVEF 25-30%, mid-distal anterolateral wall AK, apex dilated, dyskinetic s/p DES-prox LAD stenosis.  . CORONARY BALLOON ANGIOPLASTY N/A 07/19/2016   Procedure: Coronary Balloon Angioplasty;  Surgeon: Jordan, Peter M, MD;  Location: [facility] INVASIVE CV LAB;  Service: Cardiovascular;  Laterality: N/A;  . defib    . LEFT HEART CATH Bilateral 12/12/2011   Procedure: LEFT HEART CATH;  Surgeon: Peter M Jordan, MD;  Location: [facility] CATH LAB;  Service: Cardiovascular;  Laterality: Bilateral;  . LEFT HEART CATH AND CORONARY ANGIOGRAPHY N/A 07/19/2016   Procedure: Left Heart Cath and Coronary Angiography;  Surgeon: Jordan, Peter M, MD;  Location: [facility] INVASIVE CV LAB;  Service: Cardiovascular;  Laterality:   N/A;    Current Outpatient Medications  Medication Sig Dispense Refill  . aspirin EC 81 MG EC tablet Take 1 tablet (81 mg total) by mouth daily. 30 tablet 3  . atorvastatin (LIPITOR) 80 MG tablet Take 1 tablet (80 mg total) by mouth daily at 6 PM. NEED OV. 90 tablet 3  . clopidogrel (PLAVIX) 75 MG tablet Take 1 tablet (75 mg total) by mouth daily. NEED OV. 90 tablet 0  . lisinopril (PRINIVIL,ZESTRIL) 10 MG tablet Take 1 tablet (10 mg total) by mouth daily. NEED  OV. 90 tablet 0  . metoprolol succinate (TOPROL-XL) 25 MG 24 hr tablet Take 1 tablet (25 mg total) by mouth daily. NEED OV. 90 tablet 0  . nitroGLYCERIN (NITROSTAT) 0.4 MG SL tablet Place 1 tablet (0.4 mg total) under the tongue every 5 (five) minutes as needed. For chest pain 25 tablet 3  . oxymetazoline (AFRIN) 0.05 % nasal spray Place 1 spray into both nostrils 4 (four) times daily as needed for congestion.    . pantoprazole (PROTONIX) 40 MG tablet Take 40 mg by mouth daily.  5   No current facility-administered medications for this visit.     No Known Allergies  Review of Systems negative except from HPI and PMH  Physical Exam BP 126/78   Pulse 70   Ht 6\' 1"  (1.854 m)   Wt 267 lb 3.2 oz (121.2 kg)   SpO2 95%   BMI 35.25 kg/m  Well developed and Morbidly obese in no acute distress HENT normal Neck supple with JVP 7-8 Clear Device pocket well healed; without hematoma or erythema.  There is no tethering  Regular rate and rhythm, no gallop 2/6 murmur Abd-soft with active BS No Clubbing cyanosis   edema Skin-warm and dry A & Oriented  Grossly normal sensory and motor function   ECG demonstrates sinus rhythm at 70 Intervals 14/09/44 Anterior wall MI Q waves V1-V6 Inferior wall MI Q waves 2-3-F  Assessment and  Plan  Ventricular tachycardia    Ischemic cardiomyopathy    Congestive heart failure chronic systolic   TYobacco abuse__ still smoking  Not rady to pull the stopping trigger  Implantable defibrillator-Medtronic    VT-nonsustained-intercurrent-symptomatic  Device has reached ERI.   We have reviewed the benefits and risks of generator replacement.  These include but are not limited to lead fracture and infection.  The patient understands, agrees and is willing to proceed.    With worsening exertional chest discomfort and shortness of breath, will arrange for repeat catheterization with Dr. Garth Bigness.  Given his ongoing cardiomyopathy, we will switch him from  lisinopril--Entresto.  Thereafter, perhaps when he comes in for his catheterization he can be switched from metoprolol--carvedilol.  And we see him for his ICD generator replacement we will anticipate the initiation of spironolactone.  He has diabetes.  Currently not on therapy.  In conjunction with EM-Pharm.D., we will begin him on empagliflozin

## 2018-03-09 LAB — CUP PACEART INCLINIC DEVICE CHECK
Battery Voltage: 2.61 V
Brady Statistic RV Percent Paced: 0.01 %
HighPow Impedance: 342 Ohm
HighPow Impedance: 52 Ohm
HighPow Impedance: 70 Ohm
Implantable Lead Implant Date: 20111102
Implantable Lead Location: 753860
Implantable Lead Model: 6947
Implantable Pulse Generator Implant Date: 20111102
Lead Channel Impedance Value: 437 Ohm
Lead Channel Sensing Intrinsic Amplitude: 14.125 mV
Lead Channel Sensing Intrinsic Amplitude: 15.5 mV
Lead Channel Setting Pacing Amplitude: 2.5 V
Lead Channel Setting Pacing Pulse Width: 0.4 ms
MDC IDC SESS DTM: 20200226153805
MDC IDC SET LEADCHNL RV SENSING SENSITIVITY: 0.3 mV

## 2018-03-10 ENCOUNTER — Telehealth: Payer: Self-pay

## 2018-03-10 NOTE — Telephone Encounter (Signed)
PA Case: [id number],  Status: Approved,  Coverage Starts on: 03/10/2018 12:00:00 AM,  Coverage Ends on: 01/11/2019 12:00:00 AM.  Questions? Contact [phone].

## 2018-03-10 NOTE — Telephone Encounter (Signed)
Prior Auth stated through [employer] ii (Key: [id number]) Rx #: [id number] Entresto 24-26MG  tablets Wait for Determination Please wait for [facility] NCPDP 2017 to return a determination.

## 2018-03-13 ENCOUNTER — Telehealth: Payer: Self-pay | Admitting: *Deleted

## 2018-03-13 ENCOUNTER — Telehealth: Payer: Self-pay | Admitting: Internal Medicine

## 2018-03-13 MED ORDER — EMPAGLIFLOZIN 10 MG PO TABS: ORAL_TABLET | ORAL | 2 refills | Status: DC

## 2018-03-13 MED ORDER — SACUBITRIL-VALSARTAN 24-26 MG PO TABS: 1.0000 | ORAL_TABLET | Freq: Two times a day (BID) | ORAL | 11 refills | Status: DC

## 2018-03-13 NOTE — Telephone Encounter (Addendum)
Pt contacted pre-catheterization scheduled at [facility] for: Tuesday March 14, 2018 12:30 PM Verified arrival time and place: [facility] Main Entrance A at: 10:30 AM  No solid food after midnight prior to cath, clear liquids until 5 AM day of procedure. Contrast allergy: no  Hold: Jardiance-pt states he has not started this yet.   AM meds can be  taken pre-cath with sip of water including: ASA 81 mg Clopidogrel 75 mg  Confirmed patient has responsible person to drive home post procedure and observe 24 hours after arriving home: yes  Pt states he has not started Entresto yet, he is still taking lisinopril.

## 2018-03-13 NOTE — Telephone Encounter (Signed)
Pt needs 30 day scripts [facility] to [facility] [place]. Scripts have been sent; pt has no additional questions.

## 2018-03-13 NOTE — Telephone Encounter (Signed)
New Message     *STAT* If patient is at the pharmacy, call can be transferred to refill team.   1. Which medications need to be refilled? (please list name of each medication and dose if known) Jardiance 10 mg and Entresto 24-26mg   2. Which pharmacy/location (including street and city if local pharmacy) is medication to [facility] sent to[facility]   3. Do they need a 30 day or 90 day supply? 30 days    PTs wife said they sent the prescriptions to the wrong pharmacy   PTs wife is also wondering if hes suppose to take the Jardiance before his heart cath

## 2018-03-14 ENCOUNTER — Ambulatory Visit (HOSPITAL_COMMUNITY)
Admission: RE | Admit: 2018-03-14 | Discharge: 2018-03-14 | Disposition: A | Discharge status: Home / Self Care | Payer: Medicare HMO | Attending: Cardiology | Admitting: Cardiology

## 2018-03-14 ENCOUNTER — Encounter (HOSPITAL_COMMUNITY)
Admission: RE | Disposition: A | Discharge status: Home / Self Care | Payer: Self-pay | Source: Home / Self Care | Attending: Cardiology

## 2018-03-14 ENCOUNTER — Other Ambulatory Visit: Payer: Self-pay

## 2018-03-14 DIAGNOSIS — Z955 Presence of coronary angioplasty implant and graft: Secondary | ICD-10-CM | POA: Diagnosis not present

## 2018-03-14 DIAGNOSIS — Z79899 Other long term (current) drug therapy: Secondary | ICD-10-CM | POA: Diagnosis not present

## 2018-03-14 DIAGNOSIS — I255 Ischemic cardiomyopathy: Secondary | ICD-10-CM | POA: Diagnosis not present

## 2018-03-14 DIAGNOSIS — I5022 Chronic systolic (congestive) heart failure: Secondary | ICD-10-CM | POA: Diagnosis not present

## 2018-03-14 DIAGNOSIS — I25119 Atherosclerotic heart disease of native coronary artery with unspecified angina pectoris: Secondary | ICD-10-CM

## 2018-03-14 DIAGNOSIS — Z6835 Body mass index (BMI) 35.0-35.9, adult: Secondary | ICD-10-CM | POA: Diagnosis not present

## 2018-03-14 DIAGNOSIS — Z7982 Long term (current) use of aspirin: Secondary | ICD-10-CM | POA: Diagnosis not present

## 2018-03-14 DIAGNOSIS — I209 Angina pectoris, unspecified: Secondary | ICD-10-CM | POA: Diagnosis present

## 2018-03-14 DIAGNOSIS — I252 Old myocardial infarction: Secondary | ICD-10-CM | POA: Diagnosis not present

## 2018-03-14 DIAGNOSIS — E119 Type 2 diabetes mellitus without complications: Secondary | ICD-10-CM | POA: Insufficient documentation

## 2018-03-14 DIAGNOSIS — E785 Hyperlipidemia, unspecified: Secondary | ICD-10-CM | POA: Insufficient documentation

## 2018-03-14 DIAGNOSIS — I472 Ventricular tachycardia, unspecified: Secondary | ICD-10-CM

## 2018-03-14 DIAGNOSIS — Z7902 Long term (current) use of antithrombotics/antiplatelets: Secondary | ICD-10-CM | POA: Diagnosis not present

## 2018-03-14 DIAGNOSIS — I110 Hypertensive heart disease with heart failure: Secondary | ICD-10-CM | POA: Diagnosis not present

## 2018-03-14 DIAGNOSIS — Z95810 Presence of automatic (implantable) cardiac defibrillator: Secondary | ICD-10-CM | POA: Diagnosis not present

## 2018-03-14 DIAGNOSIS — F17200 Nicotine dependence, unspecified, uncomplicated: Secondary | ICD-10-CM | POA: Diagnosis present

## 2018-03-14 DIAGNOSIS — R0602 Shortness of breath: Secondary | ICD-10-CM | POA: Insufficient documentation

## 2018-03-14 HISTORY — PX: CARDIAC CATHETERIZATION: SHX172

## 2018-03-14 HISTORY — PX: LEFT HEART CATH AND CORONARY ANGIOGRAPHY: CATH118249

## 2018-03-14 LAB — GLUCOSE, CAPILLARY: Glucose-Capillary: 186 mg/dL — ABNORMAL HIGH (ref 70–99)

## 2018-03-14 SURGERY — LEFT HEART CATH AND CORONARY ANGIOGRAPHY
Anesthesia: LOCAL

## 2018-03-14 MED ORDER — SODIUM CHLORIDE 0.9% FLUSH: 3.0000 mL | Freq: Two times a day (BID) | INTRAVENOUS | Status: DC

## 2018-03-14 MED ORDER — SODIUM CHLORIDE 0.9 % WEIGHT BASED INFUSION: 1.0000 mL/kg/h | INTRAVENOUS | Status: AC

## 2018-03-14 MED ORDER — SODIUM CHLORIDE 0.9 % WEIGHT BASED INFUSION
3.0000 mL/kg/h | INTRAVENOUS | Status: AC
  Administered 2018-03-14: 3 mL/kg/h via INTRAVENOUS

## 2018-03-14 MED ORDER — FENTANYL CITRATE (PF) 100 MCG/2ML IJ SOLN
INTRAMUSCULAR | Status: AC
  Filled 2018-03-14: qty 2

## 2018-03-14 MED ORDER — VERAPAMIL HCL 2.5 MG/ML IV SOLN
INTRAVENOUS | Status: AC
  Filled 2018-03-14: qty 2

## 2018-03-14 MED ORDER — LIDOCAINE HCL (PF) 1 % IJ SOLN
INTRAMUSCULAR | Status: AC
  Filled 2018-03-14: qty 30

## 2018-03-14 MED ORDER — MIDAZOLAM HCL 2 MG/2ML IJ SOLN
INTRAMUSCULAR | Status: DC | PRN
  Administered 2018-03-14: 1 mg via INTRAVENOUS

## 2018-03-14 MED ORDER — SODIUM CHLORIDE 0.9% FLUSH: 3.0000 mL | INTRAVENOUS | Status: DC | PRN

## 2018-03-14 MED ORDER — LIDOCAINE HCL (PF) 1 % IJ SOLN
INTRAMUSCULAR | Status: DC | PRN
  Administered 2018-03-14: 2 mL

## 2018-03-14 MED ORDER — SODIUM CHLORIDE 0.9 % IV SOLN: 250.0000 mL | INTRAVENOUS | Status: DC | PRN

## 2018-03-14 MED ORDER — MIDAZOLAM HCL 2 MG/2ML IJ SOLN
INTRAMUSCULAR | Status: AC
  Filled 2018-03-14: qty 2

## 2018-03-14 MED ORDER — HEPARIN SODIUM (PORCINE) 1000 UNIT/ML IJ SOLN
INTRAMUSCULAR | Status: AC
  Filled 2018-03-14: qty 1

## 2018-03-14 MED ORDER — VERAPAMIL HCL 2.5 MG/ML IV SOLN
INTRAVENOUS | Status: DC | PRN
  Administered 2018-03-14: 10 mL via INTRA_ARTERIAL

## 2018-03-14 MED ORDER — SODIUM CHLORIDE 0.9 % WEIGHT BASED INFUSION: 1.0000 mL/kg/h | INTRAVENOUS | Status: DC

## 2018-03-14 MED ORDER — HEPARIN (PORCINE) IN NACL 1000-0.9 UT/500ML-% IV SOLN
INTRAVENOUS | Status: AC
  Filled 2018-03-14: qty 1000

## 2018-03-14 MED ORDER — IOHEXOL 350 MG/ML SOLN
INTRAVENOUS | Status: DC | PRN
  Administered 2018-03-14: 80 mL via INTRA_ARTERIAL

## 2018-03-14 MED ORDER — FENTANYL CITRATE (PF) 100 MCG/2ML IJ SOLN
INTRAMUSCULAR | Status: DC | PRN
  Administered 2018-03-14: 25 ug via INTRAVENOUS

## 2018-03-14 MED ORDER — HEPARIN SODIUM (PORCINE) 1000 UNIT/ML IJ SOLN
INTRAMUSCULAR | Status: DC | PRN
  Administered 2018-03-14: 6000 [IU] via INTRAVENOUS

## 2018-03-14 MED ORDER — ASPIRIN 81 MG PO CHEW: 81.0000 mg | CHEWABLE_TABLET | ORAL | Status: DC

## 2018-03-14 MED ORDER — HEPARIN (PORCINE) IN NACL 1000-0.9 UT/500ML-% IV SOLN
INTRAVENOUS | Status: DC | PRN
  Administered 2018-03-14 (×2): 500 mL

## 2018-03-14 MED ORDER — ONDANSETRON HCL 4 MG/2ML IJ SOLN: 4.0000 mg | Freq: Four times a day (QID) | INTRAMUSCULAR | Status: DC | PRN

## 2018-03-14 MED ORDER — CARVEDILOL 12.5 MG PO TABS: 12.5000 mg | ORAL_TABLET | Freq: Two times a day (BID) | ORAL | 11 refills | Status: DC

## 2018-03-14 MED ORDER — ACETAMINOPHEN 325 MG PO TABS: 650.0000 mg | ORAL_TABLET | ORAL | Status: DC | PRN

## 2018-03-14 SURGICAL SUPPLY — 11 items
CATH 5FR JL3.5 JR4 ANG PIG MP (CATHETERS) ×1 IMPLANT
DEVICE RAD COMP TR BAND LRG (VASCULAR PRODUCTS) ×1 IMPLANT
DEVICE RAD TR BAND REGULAR (VASCULAR PRODUCTS) IMPLANT
GLIDESHEATH SLEND SS 6F .021 (SHEATH) ×1 IMPLANT
GUIDEWIRE INQWIRE 1.5J.035X260 (WIRE) IMPLANT
INQWIRE 1.5J .035X260CM (WIRE) ×2
KIT HEART LEFT (KITS) ×2 IMPLANT
PACK CARDIAC CATHETERIZATION (CUSTOM PROCEDURE TRAY) ×2 IMPLANT
SYR MEDRAD MARK 7 150ML (SYRINGE) ×2 IMPLANT
TRANSDUCER W/STOPCOCK (MISCELLANEOUS) ×2 IMPLANT
TUBING CIL FLEX 10 FLL-RA (TUBING) ×2 IMPLANT

## 2018-03-14 NOTE — Discharge Instructions (Addendum)
We will switch metoprolol to Coreg 12.5 mg twice a day.     DRINK PLENTY OF FLUIDS FOR THE NEXT 2-3 DAYS TO KEEP HYDRATED.  Radial Site Care  This sheet gives you information about how to care for yourself after your procedure. Your health care provider may also give you more specific instructions. If you have problems or questions, contact your health care provider. What can I expect after the procedure? After the procedure, it is common to have:  Bruising and tenderness at the catheter insertion area. Follow these instructions at home: Medicines  Take over-the-counter and prescription medicines only as told by your health care provider. Insertion site care  Follow instructions from your health care provider about how to take care of your insertion site. Make sure you: ? Wash your hands with soap and water before you change your bandage (dressing). If soap and water are not available, use hand sanitizer. ? Change your dressing as told by your health care provider. ? Remove gauze dressing after 24 hours.  Check your insertion site every day for signs of infection. Check for: ? Redness, swelling, or pain. ? Fluid or blood. ? Pus or a bad smell. ? Warmth.  Do not take baths, swim, or use a hot tub until your health care provider approves.  You may shower 24-48 hours after the procedure, or as directed by your health care provider. ? Remove the dressing and gently wash the site with plain soap and water. ? Pat the area dry with a clean towel. ? Do not rub the site. That could cause bleeding.  Do not apply powder or lotion to the site. Activity   For 24 hours after the procedure, or as directed by your health care provider: ? Do not flex or bend the affected arm. ? Do not push or pull heavy objects with the affected arm. ? Do not drive yourself home from the hospital or clinic. You may drive 24 hours after the procedure unless your health care provider tells you not  to. ? Do not operate machinery or power tools.  Do not lift anything that is heavier than 10 lb (4.5 kg), or the limit that you are told, until your health care provider says that it is safe.  Ask your health care provider when it is okay to: ? Return to work or school. ? Resume usual physical activities or sports. ? Resume sexual activity. General instructions  If the catheter site starts to bleed, raise your arm and put firm pressure on the site. If the bleeding does not stop, get help right away. This is a medical emergency.  If you went home on the same day as your procedure, a responsible adult should be with you for the first 24 hours after you arrive home.  Keep all follow-up visits as told by your health care provider. This is important. Contact a health care provider if:  You have a fever.  You have redness, swelling, or yellow drainage around your insertion site. Get help right away if:  You have unusual pain at the radial site.  The catheter insertion area swells very fast.  The insertion area is bleeding, and the bleeding does not stop when you hold steady pressure on the area.  Your arm or hand becomes pale, cool, tingly, or numb. These symptoms may represent a serious problem that is an emergency. Do not wait to see if the symptoms will go away. Get medical help right away. Call  your local emergency services (911 in the [place] Do not drive yourself to the hospital. Summary  After the procedure, it is common to have bruising and tenderness at the site.  Follow instructions from your health care provider about how to take care of your radial site wound. Check the wound every day for signs of infection.  Do not lift anything that is heavier than 10 lb (4.5 kg), or the limit that you are told, until your health care provider says that it is safe. This information is not intended to replace advice given to you by your health care provider. Make sure you discuss any  questions you have with your health care provider. Document Released: 01/30/2010 Document Revised: 02/02/2017 Document Reviewed: 02/02/2017 Elsevier Interactive Patient Education  2019 [employer].

## 2018-03-14 NOTE — Telephone Encounter (Signed)
Entresto approval letter received via fax from [place]. I will send to be scanned into the pts chart.

## 2018-03-14 NOTE — Interval H&P Note (Signed)
History and Physical Interval Note:  03/14/2018 12:58 PM  Jonathan Fischer  has presented today for surgery, with the diagnosis of cad  The various methods of treatment have been discussed with the patient and family. After consideration of risks, benefits and other options for treatment, the patient has consented to  Procedure(s): LEFT HEART CATH AND CORONARY ANGIOGRAPHY (N/A) as a surgical intervention .  The patient's history has been reviewed, patient examined, no change in status, stable for surgery.  I have reviewed the patient's chart and labs.  Questions were answered to the patient's satisfaction.     Theron Arista [facility] 03/14/2018 12:58 PM Cath Lab Visit (complete for each Cath Lab visit)  Clinical Evaluation Leading to the Procedure:   ACS: No.  Non-ACS:    Anginal Classification: CCS Fischer  Anti-ischemic medical therapy: Minimal Therapy (1 class of medications)  Non-Invasive Test Results: No non-invasive testing performed  Prior CABG: No previous CABG

## 2018-03-15 ENCOUNTER — Encounter (HOSPITAL_COMMUNITY): Payer: Self-pay | Admitting: Cardiology

## 2018-03-15 ENCOUNTER — Telehealth: Payer: Self-pay | Admitting: Internal Medicine

## 2018-03-15 ENCOUNTER — Other Ambulatory Visit: Payer: Self-pay | Admitting: Cardiology

## 2018-03-15 ENCOUNTER — Telehealth: Payer: Self-pay | Admitting: Cardiology

## 2018-03-15 NOTE — Telephone Encounter (Signed)
° °  Pt c/o medication issue:  1. Name of Medication: empagliflozin (JARDIANCE) 10 MG TABS tablet and sacubitril-valsartan (ENTRESTO) 24-26 MG  2. How are you currently taking this medication (dosage and times per day)? As written  3. Are you having a reaction (difficulty breathing--STAT)? no  4. What is your medication issue? Too costly, requesting information for assistance programs

## 2018-03-15 NOTE — Telephone Encounter (Signed)
° ° °  1. Which medications need to be refilled? (please list name of each medication and dose if known) Carvedilol  2. Which pharmacy/location (including street and city if local pharmacy) is medication to be sent to? [facility] mail order  3. Do they need a 30 day or 90 day supply? 90

## 2018-03-16 MED ORDER — CARVEDILOL 12.5 MG PO TABS: 12.5000 mg | ORAL_TABLET | Freq: Two times a day (BID) | ORAL | 3 refills | Status: DC

## 2018-03-20 DIAGNOSIS — Z95810 Presence of automatic (implantable) cardiac defibrillator: Secondary | ICD-10-CM | POA: Diagnosis not present

## 2018-03-20 DIAGNOSIS — F17200 Nicotine dependence, unspecified, uncomplicated: Secondary | ICD-10-CM | POA: Diagnosis not present

## 2018-03-20 DIAGNOSIS — I255 Ischemic cardiomyopathy: Secondary | ICD-10-CM | POA: Diagnosis not present

## 2018-03-20 DIAGNOSIS — E119 Type 2 diabetes mellitus without complications: Secondary | ICD-10-CM | POA: Diagnosis not present

## 2018-03-20 DIAGNOSIS — I472 Ventricular tachycardia: Secondary | ICD-10-CM | POA: Diagnosis not present

## 2018-03-20 NOTE — Telephone Encounter (Signed)
LMTCB

## 2018-03-20 NOTE — Telephone Encounter (Signed)
**Note De-Identified Vora Clover Obfuscation** Entresto PA already obtained. See phone note from 03/10/2018.  I called [facility] and was advised that a PA is not needed on the pts Jardiance and that a 30 day supply will cost the pt $45. I have attempted a tier exception through covermymeds. Key: [id number]

## 2018-03-20 NOTE — Telephone Encounter (Signed)
Letter received from [facility] stating that they have denied the pts Jardiance PA. Reason: [place] Pepper is covered at the co-pay tier listed in your current drug guide.  Since there isn't a brand name drug for your condition in a lower coat-sharing tier, a tier exception is not permitted.  I will forward this message to Dr Graciela Husbands and his nurse for advisement to the pt.

## 2018-03-22 DIAGNOSIS — E119 Type 2 diabetes mellitus without complications: Secondary | ICD-10-CM | POA: Diagnosis not present

## 2018-03-22 DIAGNOSIS — I255 Ischemic cardiomyopathy: Secondary | ICD-10-CM | POA: Diagnosis not present

## 2018-03-23 ENCOUNTER — Other Ambulatory Visit: Payer: Self-pay

## 2018-03-23 MED ORDER — PANTOPRAZOLE SODIUM 40 MG PO TBEC: 40.0000 mg | DELAYED_RELEASE_TABLET | Freq: Every evening | ORAL | 3 refills | Status: DC

## 2018-03-23 NOTE — Telephone Encounter (Signed)
Rx(s) sent to pharmacy electronically.  

## 2018-03-23 NOTE — Telephone Encounter (Signed)
Pt is aware of decline in tier exception. He has already been in contact with his PCP regarding new DMII medication. He is unsure of what he started, but will update his med list during his next OV.   He has no additional questions or needs.

## 2018-03-27 ENCOUNTER — Encounter: Payer: Self-pay | Admitting: Internal Medicine

## 2018-04-03 ENCOUNTER — Telehealth: Payer: Self-pay

## 2018-04-03 NOTE — Telephone Encounter (Signed)
Spoke with pt regarding his upcoming appt and ERI device status. Per Dr Graciela Husbands, pt is safe to postpone OV and generator change out until mid May. I advised pt I would be calling him in about 4 weeks to discuss current situation and setting up for his change out. He denies SOB, CP, edema, syncope, dizziness, or palpitations. He understands we will be monitoring him remotely and if he needs anything in the meantime to call the office. He has agreed with this plan and has no additional questions.

## 2018-04-05 ENCOUNTER — Encounter: Payer: Medicare HMO | Admitting: Internal Medicine

## 2018-04-11 ENCOUNTER — Telehealth: Payer: Self-pay

## 2018-04-11 NOTE — Telephone Encounter (Signed)
I called to speak with patient, he states that he does not need to come in, that he had his heart catheterization and all he needs is to set up a time to have his device changed out. I advised patient that you would call to set this up once we are able to schedule procedures.

## 2018-04-28 ENCOUNTER — Telehealth: Payer: Self-pay | Admitting: Internal Medicine

## 2018-04-28 DIAGNOSIS — I420 Dilated cardiomyopathy: Secondary | ICD-10-CM

## 2018-04-28 NOTE — Telephone Encounter (Signed)
Spoke with pt's wife regarding his change out. Dr Graciela Husbands would like to potentially have his generator changed out on May 8th. I assured pt's wife his battery is slotted to be ok through the middle of May. I will be contacting her back with solid date and time in the the next week or so. She verbalized understanding and has no additional questions.

## 2018-04-28 NOTE — Telephone Encounter (Signed)
New Message    Pts wife is calling because she has not heard back from anyone about rescheduling the appt. The battery for the device will be out May 1st    Please call

## 2018-05-04 NOTE — Addendum Note (Signed)
Addended by: Oretha Milch on: 05/04/2018 06:02 PM   Modules accepted: Orders

## 2018-05-04 NOTE — Telephone Encounter (Signed)
Spoke with pt's wife regarding his generator change out with Dr Elberta Fortis. Pt verbalized understanding of and agreed to the following:  1. Arrive at the [employer] "A" at [facility] @ 5:30am on May 8th. 2. Do not eat or drink after midnight the night before his procedure. 3. Hold all medications the morning of his procedure. He may take them when he gets discharged home. 4. Shower the night before and morning of his procedure with an antibacterial soap. 5. Have labs drawn at [address], BMP and CBC, on May 1st.   I advised pt's wife I was unsure of the visitor restrictions with outpatient procedures, but I am certain not more than one person would potentially be allowed to accompany him.  If pt has any additional questions, they will call the office.

## 2018-05-05 ENCOUNTER — Telehealth: Payer: Self-pay

## 2018-05-05 NOTE — Telephone Encounter (Signed)
**Note De-Identified Ryleah Miramontes Obfuscation** I called the pt back as I am unsure if he is out of Entresto at this point and if so to ask him to resume his Lisinopril until we get his Sherryll Burger cost down.  He states that he has been out of Entresto for a week and that he has plenty of Lisinopril on hand. I have advised him to resume taking his Lisinopril at the dose he was taking prior to stopping it on 03/08/2018 which was 10 mg once daily.  He verbalized understanding and thanked me for my concern.

## 2018-05-05 NOTE — Telephone Encounter (Signed)
° °  Please call spouse regarding Entresto assistance

## 2018-05-05 NOTE — Telephone Encounter (Addendum)
**Note De-identified Roark Rufo Obfuscation** -----  **Note De-Identified Ingris Pasquarella Obfuscation** Message from Oretha Milch, RN sent at 05/04/2018  6:04 PM EDT ----- Regarding: Jonathan Fischer  This pt is requesting an application for financial assistance for [facility]. I'm not sure how we are getting these to ppl, but he will be by the office on May 1st for a lab draw. He could pick it up and fill it out then if so.  He is currently not at his doughnut hole yet. I explained to him he will have to meet this before receiving any financial aid through the manufacturer, but should still apply for when it happens.   He also wanted to know the name/number to the [employer] drug assistance program. I could not remember what it was called.  I will be out of the office starting 4/23-4/28 but can call them when I return.

## 2018-05-05 NOTE — Telephone Encounter (Signed)
I s/w the pt who is having a lot of concern and anxiety trying to afford his Delene Loll as he lives on a limited income.  He states that his wife checked the cost of Entresto through his [employer] and he believes they told her it would cost him more than $100 a month. He is aware that he is not in the "donut hole" so he does not feel he is eligable to be approved through [employer] pt asst program.  He is asking to be put on a less expensive medication.  I have asked him to contact his insurance company to find out if he has a deductible that has not been met and if so how much, to find out how much Delene Loll will cost him once his deductible has been met, and to call me back with this info.  He is aware that we will discuss next steps once he calls back.

## 2018-05-05 NOTE — Telephone Encounter (Signed)
**Note De-Identified Kayly Kriegel Obfuscation** The pts wife states that [facility] told her that the pt has a $160 deductible and that once that is met his Delene Loll will cost him $45 a month. The pts wife states that [facility] also advised her that if we do a tier exception and it is granted on the pts Entresto they will not have to meet his deductible and that if not they cannot afford Entresto as the deductible will be difficult for them pay.  I have advised both the pt and his wife that I will work on this tier exception and will call them with updates when available.

## 2018-05-08 NOTE — Telephone Encounter (Signed)
**Note De-Identified [name] Obfuscation** I have done an Entresto tier exception through covermymeds. Key: [id number]

## 2018-05-09 NOTE — Telephone Encounter (Signed)
Letter received from [facility] stating that they have denied the tier exception on the pts Entresto. Reason: Per Medicare Part D rules: Since there isn't a brand name drug for your condition in a lower cost-sharing tier a tier exception is not permitted.  I have notified the pt of this denial. He states that he cannot afford Entresto at its current cost.  I gave him the phone number to reach out to [employer] [phone]) to discuss getting assistance with the cost of his Entresto.  Also, he is aware that I am referring him to Lasandra Beech, LCSW who will contact him concerning assistance with the cost of his Entresto.  He verbalized understanding and thanked me for helping him.

## 2018-05-10 ENCOUNTER — Telehealth: Payer: Self-pay | Admitting: Licensed Clinical Social Worker

## 2018-05-10 NOTE — Telephone Encounter (Signed)
CSW received referral to assist patient with [facility] patient assistance. CSW discussed at length and provided patient the application via e-mail. Patient has a few questions regarding the medication and CSW referred patient back to office staff for return call. Patient verbalizes understanding of follow up and will reach out to CSW after completed application. Lasandra Beech, LCSW, CCSW-MCS [phone]

## 2018-05-11 ENCOUNTER — Telehealth: Payer: Self-pay

## 2018-05-11 MED ORDER — LISINOPRIL 10 MG PO TABS: 10.0000 mg | ORAL_TABLET | Freq: Every day | ORAL | 3 refills | Status: DC

## 2018-05-11 NOTE — Telephone Encounter (Signed)
Called pt to discuss his Entresto prescription. It is not clear if he will be receiving financial assistance. Dr Graciela Husbands would like him to continue taking Lisinopril, 10mg  tablet, qd in lieu of his Entresto until a decision has been made.   Pt was educated on the difference between the two medications. He was reassured taking lisinopril vs Entresto will not have a substantial effect on his overall outcome, ie this is not a "life or death" medication.   Pt has verbalized understanding and had no additional questions.

## 2018-05-12 ENCOUNTER — Other Ambulatory Visit: Payer: Medicare HMO

## 2018-05-15 ENCOUNTER — Other Ambulatory Visit: Payer: Self-pay

## 2018-05-15 ENCOUNTER — Other Ambulatory Visit: Payer: Medicare HMO | Admitting: *Deleted

## 2018-05-15 DIAGNOSIS — I420 Dilated cardiomyopathy: Secondary | ICD-10-CM | POA: Diagnosis not present

## 2018-05-16 ENCOUNTER — Telehealth (HOSPITAL_COMMUNITY): Payer: Self-pay | Admitting: Licensed Clinical Social Worker

## 2018-05-16 ENCOUNTER — Telehealth: Payer: Self-pay | Admitting: Licensed Clinical Social Worker

## 2018-05-16 LAB — BASIC METABOLIC PANEL
BUN/Creatinine Ratio: 13 (ref 9–20)
BUN: 12 mg/dL (ref 6–24)
CO2: 21 mmol/L (ref 20–29)
Calcium: 9.2 mg/dL (ref 8.7–10.2)
Chloride: 102 mmol/L (ref 96–106)
Creatinine, Ser: 0.89 mg/dL (ref 0.76–1.27)
GFR calc Af Amer: 119 mL/min/{1.73_m2} (ref >59)
GFR calc non Af Amer: 103 mL/min/{1.73_m2} (ref >59)
Glucose: 197 mg/dL — ABNORMAL HIGH (ref 65–99)
Potassium: 4.4 mmol/L (ref 3.5–5.2)
Sodium: 139 mmol/L (ref 134–144)

## 2018-05-16 LAB — CBC
Hematocrit: 50.1 % (ref 37.5–51.0)
Hemoglobin: 17 g/dL (ref 13.0–17.7)
MCH: 33.5 pg — ABNORMAL HIGH (ref 26.6–33.0)
MCHC: 33.9 g/dL (ref 31.5–35.7)
MCV: 99 fL — ABNORMAL HIGH (ref 79–97)
Platelets: 217 10*3/uL (ref 150–450)
RBC: 5.07 x10E6/uL (ref 4.14–5.80)
RDW: 13.9 % (ref 11.6–15.4)
WBC: 8.2 10*3/uL (ref 3.4–10.8)

## 2018-05-16 NOTE — Telephone Encounter (Signed)
CSW received referral to contact patient for medication assistance. Patient states he gets his medications for free from [facility] 90 day program. He reports he needs to spend $100 more to meet the deductible for the [facility] patient Assistance program although if he meets the deductible then he will be in the donut hole for Medicare and all of his other medications will cost more. Patient feels he is in a bind and unable to pursue the [facility] that the MD is encouraging him to take. CSW will explore options for assistance and return call to patient. Lasandra Beech, LCSW, CCSW-MCS [phone]

## 2018-05-16 NOTE — Telephone Encounter (Signed)
CSW contacted [employer] to follow up on patient's application for [facility] Patient Assistance. CSW informed that patient does not have a pending application at this time. CSW returned call to patient and discussed at length the process to apply and emailed an application. Patient continues to hesitate to apply as he is concerned about the implications of landing in the donut hole if he switches to entresto. CSW encouraged patient to apply and see if he qualifies. If so then we could follow up with the insurance to confirm medication costs going forward. Patient agreeable to apply for [facility] Patient Assistance. He denies any concerns at present with financially paying for his medications as they are 90 day supply with no cost. CSW continues to be available as needed. Lasandra Beech, LCSW, CCSW-MCS [phone]

## 2018-05-18 NOTE — Telephone Encounter (Signed)
Called pt's wife to let her know [facility] is not allowing visitors to wait for pts during outpatient procedures. She will be allowed to drop him off and short stay will be calling her when it is time for him to be discharged.   I also reviewed his wound check appt on 5/18 @ 1200 with the device clinic.  She has verbalized understanding of the above and has no additional questions.

## 2018-05-19 ENCOUNTER — Encounter (HOSPITAL_COMMUNITY)
Admission: RE | Disposition: A | Discharge status: Home / Self Care | Payer: Medicare HMO | Source: Home / Self Care | Attending: Cardiology

## 2018-05-19 ENCOUNTER — Other Ambulatory Visit: Payer: Self-pay

## 2018-05-19 ENCOUNTER — Ambulatory Visit (HOSPITAL_COMMUNITY)
Admission: RE | Admit: 2018-05-19 | Discharge: 2018-05-19 | Disposition: A | Discharge status: Home / Self Care | Payer: Medicare HMO | Attending: Cardiology | Admitting: Cardiology

## 2018-05-19 DIAGNOSIS — E669 Obesity, unspecified: Secondary | ICD-10-CM | POA: Insufficient documentation

## 2018-05-19 DIAGNOSIS — I255 Ischemic cardiomyopathy: Secondary | ICD-10-CM | POA: Diagnosis not present

## 2018-05-19 DIAGNOSIS — I5022 Chronic systolic (congestive) heart failure: Secondary | ICD-10-CM | POA: Diagnosis not present

## 2018-05-19 DIAGNOSIS — I252 Old myocardial infarction: Secondary | ICD-10-CM | POA: Diagnosis not present

## 2018-05-19 DIAGNOSIS — I2510 Atherosclerotic heart disease of native coronary artery without angina pectoris: Secondary | ICD-10-CM | POA: Insufficient documentation

## 2018-05-19 DIAGNOSIS — E785 Hyperlipidemia, unspecified: Secondary | ICD-10-CM | POA: Insufficient documentation

## 2018-05-19 DIAGNOSIS — Z6832 Body mass index (BMI) 32.0-32.9, adult: Secondary | ICD-10-CM | POA: Insufficient documentation

## 2018-05-19 DIAGNOSIS — Z006 Encounter for examination for normal comparison and control in clinical research program: Secondary | ICD-10-CM | POA: Insufficient documentation

## 2018-05-19 DIAGNOSIS — I110 Hypertensive heart disease with heart failure: Secondary | ICD-10-CM | POA: Diagnosis not present

## 2018-05-19 DIAGNOSIS — I472 Ventricular tachycardia: Secondary | ICD-10-CM | POA: Insufficient documentation

## 2018-05-19 DIAGNOSIS — Z955 Presence of coronary angioplasty implant and graft: Secondary | ICD-10-CM | POA: Insufficient documentation

## 2018-05-19 DIAGNOSIS — Z4502 Encounter for adjustment and management of automatic implantable cardiac defibrillator: Secondary | ICD-10-CM | POA: Insufficient documentation

## 2018-05-19 HISTORY — PX: ICD GENERATOR CHANGEOUT: EP1231

## 2018-05-19 LAB — SURGICAL PCR SCREEN
MRSA, PCR: NEGATIVE
Staphylococcus aureus: NEGATIVE

## 2018-05-19 LAB — GLUCOSE, CAPILLARY
Glucose-Capillary: 175 mg/dL — ABNORMAL HIGH (ref 70–99)
Glucose-Capillary: 187 mg/dL — ABNORMAL HIGH (ref 70–99)

## 2018-05-19 SURGERY — ICD GENERATOR CHANGEOUT

## 2018-05-19 MED ORDER — ACETAMINOPHEN 325 MG PO TABS
325.0000 mg | ORAL_TABLET | ORAL | Status: DC | PRN
  Filled 2018-05-19: qty 2

## 2018-05-19 MED ORDER — CEFAZOLIN SODIUM-DEXTROSE 2-4 GM/100ML-% IV SOLN
INTRAVENOUS | Status: AC
  Filled 2018-05-19: qty 100

## 2018-05-19 MED ORDER — CHLORHEXIDINE GLUCONATE 4 % EX LIQD
60.0000 mL | Freq: Once | CUTANEOUS | Status: DC
  Filled 2018-05-19: qty 60

## 2018-05-19 MED ORDER — SODIUM CHLORIDE 0.9 % IV SOLN
INTRAVENOUS | Status: AC
  Filled 2018-05-19: qty 2

## 2018-05-19 MED ORDER — MUPIROCIN 2 % EX OINT
TOPICAL_OINTMENT | CUTANEOUS | Status: AC
  Administered 2018-05-19: 06:00:00
  Filled 2018-05-19: qty 22

## 2018-05-19 MED ORDER — CEFAZOLIN SODIUM-DEXTROSE 2-4 GM/100ML-% IV SOLN
2.0000 g | INTRAVENOUS | Status: AC
  Administered 2018-05-19: 2 g via INTRAVENOUS
  Filled 2018-05-19: qty 100

## 2018-05-19 MED ORDER — LIDOCAINE HCL (PF) 1 % IJ SOLN
INTRAMUSCULAR | Status: DC | PRN
  Administered 2018-05-19: 45 mL

## 2018-05-19 MED ORDER — ONDANSETRON HCL 4 MG/2ML IJ SOLN: 4.0000 mg | Freq: Four times a day (QID) | INTRAMUSCULAR | Status: DC | PRN

## 2018-05-19 MED ORDER — SODIUM CHLORIDE 0.9 % IV SOLN
INTRAVENOUS | Status: DC
  Administered 2018-05-19: 06:00:00 via INTRAVENOUS

## 2018-05-19 MED ORDER — SODIUM CHLORIDE 0.9 % IV SOLN
80.0000 mg | INTRAVENOUS | Status: AC
  Administered 2018-05-19: 08:00:00 80 mg
  Filled 2018-05-19: qty 2

## 2018-05-19 SURGICAL SUPPLY — 4 items
CABLE SURGICAL S-101-97-12 (CABLE) ×3 IMPLANT
ICD VISIA MRI DVFB1D1 (ICD Generator) ×2 IMPLANT
PAD PRO RADIOLUCENT 2001M-C (PAD) ×3 IMPLANT
TRAY PACEMAKER INSERTION (PACKS) ×3 IMPLANT

## 2018-05-19 NOTE — Discharge Instructions (Signed)
Pacemaker Battery Change ° °A pacemaker battery usually lasts 5-15 years (6-7 years on average). A few times a year, you will be asked to visit your health care provider to have a full evaluation of your pacemaker. When the battery is low, your pacemaker battery and generator will be completely replaced. Most often, this procedure is simpler than the first surgery because the wires (leads) that connect the generator to the heart are already in place. °There are many things that affect how long a pacemaker battery will last, including: °· The age of the pacemaker. °· The number of leads you have(1, 2, or 3). °· The pacemaker workload. If the pacemaker is helping the heart more often, the battery will not last as long. °· Power (voltage) settings. °Tell a health care provider about: °· Any allergies you have. °· All medicines you are taking, including vitamins, herbs, eye drops, creams, and over-the-counter medicines. °· Any problems you or family members have had with anesthetic medicines. °· Any blood disorders you have. °· Any surgeries you have had, especially the surgeries you have had since your last pacemaker was placed. °· Any medical conditions you have. °· Whether you are pregnant or may be pregnant. °· Any symptoms of heart problems, such as chest pain, trouble breathing, palpitations, light-headedness, or feelings of an abnormal or irregular heartbeat. °· Smoking habits. This can affect your reaction to anesthesia. °What are the risks? °Generally, this is a safe procedure. However, problems may occur, including: °· Bleeding. °· Bruising of the skin around where the surgical cut (incision) was made. °· Pulling apart of the skin at the incision site. °· Infection. °· Nerve damage. °· Injury to other organs, such as the lungs. °· Allergic reaction to anesthetics or other medicines used during the procedure. °· People with diabetes may have a temporary increase in blood sugar (glucose) after any surgical  procedure. °What happens before the procedure? °Staying hydrated °Follow instructions from your health care provider about hydration, which may include: °· Up to 2 hours before the procedure - you may continue to drink clear liquids, such as water, clear fruit juice, black coffee, and plain tea. °Eating and drinking restrictions °Follow instructions from your health care provider about eating and drinking restrictions, which may include: °· 8 hours before the procedure - stop eating heavy meals or foods such as meat, fried foods, or fatty foods. °· 6 hours before the procedure - stop eating light meals or foods, such as toast or cereal. °· 6 hours before the procedure - stop drinking milk or drinks that contain milk. °· 2 hours before the procedure - stop drinking clear liquids. °General instructions °· Ask your health care provider about: °? Changing or stopping your regular medicines. This is especially important if you are taking diabetes medicines or blood thinners. °? Taking medicines such as aspirin and ibuprofen. These medicines can thin your blood. Do not take these medicines before your procedure if your health care provider instructs you not to. °? Taking a sip of water with any approved medicines on the morning of the procedure. °· Plan to have someone take you home after the procedure. °What happens during the procedure? °· To reduce your risk of infection: °? Your health care team will wash or sanitize their hands. °? The skin around the area of the chest will be washed with soap. °? Hair may be removed from the surgical area. °· An IV tube will be inserted into one your veins to give   Your health care team will wash or sanitize their hands.  ? The skin around the area of the chest will be washed with soap.  ? Hair may be removed from the surgical area.  · An IV tube will be inserted into one your veins to give you medicine and fluids.  · You will be given one or more of the following:  ? A medicine to help you relax (sedative).  ? A medicine to numb the area where the pacemaker is located (local anesthetic).  · You may be given antibiotic medicine to prevent infection.  · Your health care provider will make an incision to reopen  the pocket holding the pacemaker.  · The old pacemaker will be disconnected from the leads.  · The leads will be tested.  · If needed, the leads will be replaced. If the leads are functioning properly, the new pacemaker will be connected to the existing leads.  · A heart monitor and the pacemaker programmer will be used to make sure that the newly implanted pacemaker is working properly.  · The incision site will be closed. A bandage (dressing) will be placed over the pacemaker site.  The procedure may vary among health care providers and hospitals.  What happens after the procedure?  · Your blood pressure, heart rate, breathing rate, and blood oxygen level will be monitored until your health care team is satisfied that your pacemaker is working properly.  · Your health care provider will tell you when your pacemaker will need to be tested again, or when to return to the office for removal of dressing and stitches.  · Do not drive for 24 hours if you were given a sedative.  · The dressing will be removed 24-48 hours after the procedure, or as told by your health care provider.  Summary  · A pacemaker battery usually lasts 5-15 years (6-7 years on average).  · When the battery is low, your pacemaker battery and generator will be completely replaced.  · Risks of this procedure include bleeding, bruising, infection, damage to other structures, pulling apart of the skin at the incision site, and allergic reactions to medicines or anesthetics.  · Most often, this procedure is simpler than the first surgery because the wires (leads) that connect the generator to the heart are already in place.  This information is not intended to replace advice given to you by your health care provider. Make sure you discuss any questions you have with your health care provider.  Document Released: 04/07/2006 Document Revised: 11/25/2016 Document Reviewed: 12/02/2015  Elsevier Interactive Patient Education © 2019 [employer]

## 2018-05-19 NOTE — Progress Notes (Signed)
Discharge instructions reviewed with pt voices understanding,

## 2018-05-19 NOTE — H&P (Signed)
Patient Care Team: Pearson Grippe, MD as PCP - General (Internal Medicine)   HPI  Jonathan Fischer is a 48 y.o. male Seen in followup for an ICD implanted for complex coronary disease implanted November 2011. History of appropriate therapy for ventricular tachycardia with ICD discharge.  The rhythm strip was reviewed it was a monomorphic tachycardia    Device reached ERI 1/20   DATE TEST EF   12/13 LHC 25-30% LAD t->>stent Cx-S, RCA-S patent  10/14 Myoview 36% Large anterior scar  7/18 LHC 25-30% mRCA 95% med rx   Date Cr K Hgb  7/18 0./83 4.6 17.5         Today, denies symptoms of palpitations, chest pain, shortness of breath, orthopnea, PND, lower extremity edema, claudication, dizziness, presyncope, syncope, bleeding, or neurologic sequela. The patient is tolerating medications without difficulties.      Past Medical History:  Diagnosis Date   Abnormal echocardiogram    a. 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   AUTOMATIC IMPLANTABLE CARDIAC DEFIBRILLATOR SITU 03/03/2010   Qualifier: Diagnosis of  By: Graciela Husbands, MD, [facility], Ty Hilts    CAD (coronary artery disease)    s/p prior LAD, LCx and RCA stenting remotely for an anterior MI, recurrent MI in 2010 with LAD stent occlusion s/p thrombectomy and PTCA, STEMI 12/2011 s/p DES-prox LAD   Cardiomyopathy, ischemic 12/14/2011   Chronic systolic CHF (congestive heart failure) (HCC)    HTN (hypertension)    Hyperlipidemia    ICD (implantable cardiac defibrillator) in place    Ischemic cardiomyopathy    EF 25-30% s/p single-chamber Medtronic ICD    NSVT (nonsustained ventricular tachycardia) (HCC) 12/14/2011   Obesity    STEMI (ST elevation myocardial infarction) (HCC) 12/12/2011   Tobacco abuse    TOBACCO USER 10/29/2009   Qualifier: Diagnosis of  By: Graciela Husbands, MD, Susie Cassette    Tooth pain  12/14/2011   VT (ventricular tachycardia) (HCC) 10/18/2012    Past Surgical History:  Procedure Laterality Date   CARDIAC CATHETERIZATION  03/31/2009   CARDIAC CATHETERIZATION  05/08/2008   CARDIAC SIZE AND SILHOUETTE NORMAL. THERE WAS ANTERIOR APICAL HYPOKINESIS WITH EF 35-40%   CARDIAC DEFIBRILLATOR PLACEMENT  11/2009   CORONARY ANGIOPLASTY     BALLOON ANGIOPLASTY TO THE LAD WITH THROMBUS EXTRACTION OF THE PREVIOUSLY STENTED SEGMENT AND NEW STENT PLACEMENT TO THE LEFT CIRCUMFLEX. EF IS DOWN IN THE 30% RANGE   CORONARY ANGIOPLASTY WITH STENT PLACEMENT  12/12/2011   30% mid LCx, mid RCA & mid LCx stents patent. LVEF 25-30%, mid-distal anterolateral wall AK, apex dilated, dyskinetic s/p DES-prox LAD stenosis.   CORONARY BALLOON ANGIOPLASTY N/A 07/19/2016   Procedure: Coronary Balloon Angioplasty;  Surgeon: Swaziland, Peter M, MD;  Location: [facility] INVASIVE CV LAB;  Service: Cardiovascular;  Laterality: N/A;   defib     LEFT HEART CATH Bilateral 12/12/2011   Procedure: LEFT HEART CATH;  Surgeon: Peter M Swaziland, MD;  Location: [facility] CATH LAB;  Service: Cardiovascular;  Laterality: Bilateral;   LEFT HEART CATH AND CORONARY ANGIOGRAPHY N/A 07/19/2016   Procedure: Left Heart Cath and Coronary Angiography;  Surgeon: Swaziland, Peter M, MD;  Location: [facility] INVASIVE CV LAB;  Service: Cardiovascular;  Laterality: N/A;   LEFT HEART CATH AND CORONARY ANGIOGRAPHY N/A 03/14/2018   Procedure: LEFT HEART CATH AND CORONARY ANGIOGRAPHY;  Surgeon: Swaziland, Peter M, MD;  Location: [facility] INVASIVE  CV LAB;  Service: Cardiovascular;  Laterality: N/A;    Current Facility-Administered Medications  Medication Dose Route Frequency Provider Last Rate Last Dose   0.9 %  sodium chloride infusion   Intravenous Continuous Janae Bonser, Andree Coss, MD 50 mL/hr at 05/19/18 3664     ceFAZolin (ANCEF) IVPB 2g/100 mL premix  2 g Intravenous On Call Einer Meals Daphine Deutscher, MD       chlorhexidine (HIBICLENS) 4 % liquid 4 application  60 mL Topical  Once Angelice Piech Daphine Deutscher, MD       chlorhexidine (HIBICLENS) 4 % liquid 4 application  60 mL Topical Once Makenzey Nanni Daphine Deutscher, MD       gentamicin (GARAMYCIN) 80 mg in sodium chloride 0.9 % 500 mL irrigation  80 mg Irrigation On Call Chanee Henrickson Daphine Deutscher, MD        No Known Allergies  Review of Systems negative except from HPI and PMH  Physical Exam BP (!) 158/79    Pulse 61    Temp 98.1 F (36.7 C)    Resp 16    Ht  (1.854 m)    Wt 112.5 kg    SpO2 97%    BMI 32.72 kg/m  GEN: Well nourished, well developed, in no acute distress  HEENT: normal  Neck: no JVD, carotid bruits, or masses Cardiac: RRR; no murmurs, rubs, or gallops,no edema  Respiratory:  clear to auscultation bilaterally, normal work of breathing GI: soft, nontender, nondistended, + BS MS: no deformity or atrophy  Skin: warm and dry, device site well healed Neuro:  Strength and sensation are intact Psych: euthymic mood, full affect    ECG demonstrates sinus rhythm at 70 Intervals 14/09/44 Anterior wall MI Q waves V1-V6 Inferior wall MI Q waves 2-3-F  Assessment and  Plan  Ventricular tachycardia    Ischemic cardiomyopathy    Congestive heart failure chronic systolic   TYobacco abuse__ still smoking  Not rady to pull the stopping trigger  Implantable defibrillator-Medtronic    Jonathan Fischer has presented today for surgery, with the diagnosis of ischemic cardiomyopathy.  The various methods of treatment have been discussed with the patient and family. After consideration of risks, benefits and other options for treatment, the patient has consented to  Procedure(s): ICD generator change as a surgical intervention .  Risks include but not limited to bleeding, tamponade, infection, pneumothorax, among others. The patient's history has been reviewed, patient examined, no change in status, stable for surgery.  I have reviewed the patient's chart and labs.  Questions were answered to the patient's  satisfaction.    Keylie Beavers Elberta Fortis, MD 05/19/2018 7:19 AM    ICD Criteria  Current LVEF:25-30%. Within 12 months prior to implant: Yes   Heart failure history: Yes, Class Fischer  Cardiomyopathy history: Yes, Ischemic Cardiomyopathy - Prior MI.  Atrial Fibrillation/Atrial Flutter: No.  Ventricular tachycardia history: Yes, Hemodynamic instability present. VT Type: Sustained Ventricular Tachycardia - Monomorphic.  Cardiac arrest history: No.  History of syndromes with risk of sudden death: No.  Previous ICD: Yes, Reason for ICD:  Primary prevention.  Current ICD indication: Secondary  PPM indication: No.  Class I or Fischer Bradycardia indication present: No  Beta Blocker therapy for 3 or more months: Yes, prescribed.   Ace Inhibitor/ARB therapy for 3 or more months: Yes, prescribed.    I have seen Jonathan Fischer is a 48 y.o. malepre-procedural and has been referred by Graciela Husbands for consideration of ICD implant for secondary prevention of  sudden death.  The patient's chart has been reviewed and they meet criteria for ICD implant.  I have had a thorough discussion with the patient reviewing options.  The patient and their family (if available) have had opportunities to ask questions and have them answered. The patient and I have decided together through the [facility] Share Decision Support Tool to generator change ICD at this time.  Risks, benefits, alternatives to ICD implantation were discussed in detail with the patient today. The patient  understands that the risks include but are not limited to bleeding, infection, pneumothorax, perforation, tamponade, vascular damage, renal failure, MI, stroke, death, inappropriate shocks, and lead dislodgement and  wishes to proceed.

## 2018-05-22 ENCOUNTER — Encounter (HOSPITAL_COMMUNITY): Payer: Self-pay | Admitting: Cardiology

## 2018-05-29 ENCOUNTER — Ambulatory Visit (INDEPENDENT_AMBULATORY_CARE_PROVIDER_SITE_OTHER): Payer: Medicare HMO | Admitting: *Deleted

## 2018-05-29 ENCOUNTER — Other Ambulatory Visit: Payer: Self-pay

## 2018-05-29 DIAGNOSIS — I472 Ventricular tachycardia, unspecified: Secondary | ICD-10-CM

## 2018-05-29 DIAGNOSIS — I420 Dilated cardiomyopathy: Secondary | ICD-10-CM

## 2018-05-29 LAB — CUP PACEART REMOTE DEVICE CHECK
Battery Voltage: 3.09 V
Brady Statistic RV Percent Paced: 0 %
Date Time Interrogation Session: 20200508172757
HighPow Impedance: 54 Ohm
HighPow Impedance: 72 Ohm
Implantable Lead Implant Date: 20111102
Implantable Lead Location: 753860
Implantable Lead Model: 6947
Implantable Pulse Generator Implant Date: 20200508
Lead Channel Impedance Value: 380 Ohm
Lead Channel Impedance Value: 437 Ohm
Lead Channel Sensing Intrinsic Amplitude: 15.5 mV
Lead Channel Setting Pacing Amplitude: 2.5 V
Lead Channel Setting Pacing Pulse Width: 0.4 ms
Lead Channel Setting Sensing Sensitivity: 0.3 mV

## 2018-05-29 LAB — CUP PACEART INCLINIC DEVICE CHECK
Battery Remaining Longevity: 137 mo
Battery Voltage: 3.15 V
Brady Statistic RV Percent Paced: 0.04 %
Date Time Interrogation Session: 20200518124436
HighPow Impedance: 52 Ohm
HighPow Impedance: 70 Ohm
Implantable Lead Implant Date: 20111102
Implantable Lead Location: 753860
Implantable Lead Model: 6947
Implantable Pulse Generator Implant Date: 20200508
Lead Channel Impedance Value: 380 Ohm
Lead Channel Impedance Value: 399 Ohm
Lead Channel Pacing Threshold Amplitude: 1 V
Lead Channel Pacing Threshold Pulse Width: 0.4 ms
Lead Channel Sensing Intrinsic Amplitude: 20 mV
Lead Channel Setting Pacing Amplitude: 2.5 V
Lead Channel Setting Pacing Pulse Width: 0.4 ms
Lead Channel Setting Sensing Sensitivity: 0.3 mV

## 2018-05-29 NOTE — Progress Notes (Signed)
Wound check appointment. Wound without redness or edema. Incision edges approximated, wound well healed. Normal device function. Thresholds, sensing, and impedances consistent with implant measurements. Device programmed at 2.5V due to chronic. Histogram distribution appropriate for patient and level of activity. No ventricular arrhythmias noted. Patient educated about wound care, arm mobility, lifting restrictions, shock plan. ROV in 3 months with implanting physician.

## 2018-06-14 ENCOUNTER — Other Ambulatory Visit: Payer: Self-pay | Admitting: Cardiology

## 2018-06-19 ENCOUNTER — Telehealth: Payer: Self-pay

## 2018-06-19 NOTE — Telephone Encounter (Signed)
Pt states he has soreness in his shoulder when he lifts his arm up, and ? Swelling over his device.  He denies redness, fever, chills, drainage, or bleeding.  He does not feel like it is necessarily related to his device.   Will check patient on Thursday afternoon at 2 pm.    Legrand Como "Jonni Sanger" [place], [place] 06/19/2018 4:52 PM

## 2018-06-19 NOTE — Telephone Encounter (Signed)
Pt wife states the pt is experiencing some swelling and pain in his chest and shoulder area. Whenever he uses his arm it hurts and swells. The area is not red or coloring. I asked the pt to send a manual transmission with his home monitor. His wife states she will get him to send one. I told her I will have the nurse to give him a call back.

## 2018-06-21 ENCOUNTER — Telehealth: Payer: Self-pay

## 2018-06-21 NOTE — Telephone Encounter (Signed)
    COVID-19 Pre-Screening Questions:  . In the past 7 to 10 days have you had a cough,  shortness of breath, headache, congestion, fever (100 or greater) body aches, chills, sore throat, or sudden loss of taste or sense of smell? . Have you been around anyone with known Covid 19. . Have you been around anyone who is awaiting Covid 19 test results in the past 7 to 10 days? . Have you been around anyone who has been exposed to Covid 19, or has mentioned symptoms of Covid 19 within the past 7 to 10 days?  If you have any concerns/questions about symptoms patients report during screening (either on the phone or at threshold). Contact the provider seeing the patient or DOD for further guidance.  If neither are available contact a member of the leadership team.          Pt answered No to all questions pertaining to Covid-19. I made the pt aware to wear a mask if he have one, and not to bring anyone to the appointment due to limiting the amount of people coming to the office.

## 2018-06-22 ENCOUNTER — Ambulatory Visit (INDEPENDENT_AMBULATORY_CARE_PROVIDER_SITE_OTHER): Payer: Medicare HMO | Admitting: Student

## 2018-06-22 ENCOUNTER — Other Ambulatory Visit: Payer: Self-pay

## 2018-06-22 DIAGNOSIS — Z95810 Presence of automatic (implantable) cardiac defibrillator: Secondary | ICD-10-CM

## 2018-06-22 LAB — CUP PACEART INCLINIC DEVICE CHECK
Battery Remaining Longevity: 137 mo
Battery Voltage: 3.14 V
Brady Statistic RV Percent Paced: 0.03 %
Date Time Interrogation Session: 20200611154450
HighPow Impedance: 57 Ohm
HighPow Impedance: 86 Ohm
Implantable Lead Implant Date: 20111102
Implantable Lead Location: 753860
Implantable Lead Model: 6947
Implantable Pulse Generator Implant Date: 20200508
Lead Channel Impedance Value: 380 Ohm
Lead Channel Impedance Value: 456 Ohm
Lead Channel Pacing Threshold Amplitude: 1 V
Lead Channel Pacing Threshold Pulse Width: 0.4 ms
Lead Channel Sensing Intrinsic Amplitude: 13.5 mV
Lead Channel Sensing Intrinsic Amplitude: 21.375 mV
Lead Channel Setting Pacing Amplitude: 2.5 V
Lead Channel Setting Pacing Pulse Width: 0.4 ms
Lead Channel Setting Sensing Sensitivity: 0.3 mV

## 2018-06-22 MED ORDER — CEPHALEXIN 500 MG PO CAPS: 500.0000 mg | ORAL_CAPSULE | Freq: Three times a day (TID) | ORAL | 0 refills | Status: DC

## 2018-06-22 NOTE — Progress Notes (Signed)
Seen in clinic today for tenderness over his ICD site with stiffness in his left shoulder. AROM limited. Pt had a piece of stitch protruding from both ends of his incision site. No drainage, erythema, or fever. Stitch was gently removed using tweezers without any immediate complications.  Pt discomfort resolved "85%" immediately after removal of suture. Attempts to express site with no evidence of bleeding or purulence.   Discussed with Dr. Curt Bears.   Ordered pt Keflex 500 mg BID x 5 days for prophylaxis and instructed him to wash the area gently with soap, daily. Quick device interrogation showed stable device function.   Legrand Como [address] [place], [place] 06/22/2018 3:36 PM

## 2018-07-18 ENCOUNTER — Ambulatory Visit
Admission: RE | Admit: 2018-07-18 | Discharge: 2018-07-18 | Disposition: A | Discharge status: Home / Self Care | Payer: Medicare HMO | Source: Ambulatory Visit | Attending: Cardiology | Admitting: Cardiology

## 2018-07-18 ENCOUNTER — Telehealth: Payer: Self-pay | Admitting: Internal Medicine

## 2018-07-18 ENCOUNTER — Other Ambulatory Visit: Payer: Self-pay

## 2018-07-18 ENCOUNTER — Ambulatory Visit (INDEPENDENT_AMBULATORY_CARE_PROVIDER_SITE_OTHER): Payer: Medicare HMO | Admitting: *Deleted

## 2018-07-18 ENCOUNTER — Telehealth: Payer: Self-pay | Admitting: *Deleted

## 2018-07-18 DIAGNOSIS — M25512 Pain in left shoulder: Secondary | ICD-10-CM

## 2018-07-18 LAB — CUP PACEART INCLINIC DEVICE CHECK
Date Time Interrogation Session: 20200707151906
Implantable Lead Implant Date: 20111102
Implantable Lead Location: 753860
Implantable Lead Model: 6947
Implantable Pulse Generator Implant Date: 20200508

## 2018-07-18 NOTE — Telephone Encounter (Signed)
Notes recorded by Constance Haw, MD on 07/18/2018 at 4:20 PM EDT  No obvio0us issues on CXR. Plan for continued shoulder exercises.    Spoke with patient and wife. Advised of left shoulder X-ray results. Pt and wife expressed frustration that he is experiencing so much discomfort and that his left shoulder area is still swollen. Advised that per notes from [place], there was no evidence of wound infection at visit today. Pt reports he feels his discomfort is worse than it was when his device was first implanted. He states "I can't even pull up my britches." Advised to try shoulder exercises, including "crawling up the wall." Offered potential referral to PT for more recommendations, but pt states he does not think home exercises or PT will be beneficial for him. Will forward message to Dr. Curt Bears for further advisement. Pt and wife verbalize understanding.

## 2018-07-18 NOTE — Telephone Encounter (Signed)
°  1. Has your device fired? no  2. Is you device beeping? no  3. Are you experiencing draining or swelling at device site? swelling  4. Are you calling to see if we received your device transmission? no  5. Have you passed out? No  Patient is in an extreme amount of pain. He had issues with the stiches after his pacemaker was changed. He was treated before, and the office was able to remove a stitch. The patient did fine for a few weeks, but has since started to have pain in the same area again.      Please route to [place]

## 2018-07-18 NOTE — Progress Notes (Signed)
Wound check appointment. Incision edges approximated, wound fully healed. No redness around incision. Mild edema in left shoulder area noted, tenderness localized at left shoulder joint. Pt sent for L shoulder xray per Dr. Curt Bears.

## 2018-07-18 NOTE — Telephone Encounter (Signed)
Per Dr. Curt Bears, pt should be seen in office to review site. Pt agreeable to an appointment in [place] today at 2:30pm.      COVID-19 Pre-Screening Questions:  . In the past 7 to 10 days have you had a cough,  shortness of breath, headache, congestion, fever (100 or greater) body aches, chills, sore throat, or sudden loss of taste or sense of smell? . Have you been around anyone with known Covid 19. . Have you been around anyone who is awaiting Covid 19 test results in the past 7 to 10 days? . Have you been around anyone who has been exposed to Covid 19, or has mentioned symptoms of Covid 19 within the past 7 to 10 days?  Pt answered "no" to pre-screen questions. Pt aware to wear his own mask to appointment.

## 2018-07-18 NOTE — Telephone Encounter (Signed)
Spoke with patient. He reports his left shoulder pain has returned for ~1 week, radiates down the back of his left arm, describes it as "hard and dull." Painful all the time, but worse with arm movement. Rates it a "35/10", reports OTC pain relievers "don't touch it", but does note pain stops when he holds his arm in a certain position. Reports his family tells him his site looks swollen, but he doesn't notice it. Denies any signs/symptoms of infection, including drainage, redness, swelling, fever/chills. Denies trauma to site or any manual labor. Advised I'll discuss with Dr. Curt Bears and call him back. Pt is agreeable to this plan.

## 2018-07-18 NOTE — Patient Instructions (Signed)
Go to [facility]) for Left Shoulder XRAY today 07/18/18.

## 2018-07-18 NOTE — Telephone Encounter (Signed)
Raquel Sarna Agree it sounds like he needs PT evaluation of his L arm  Thanks SK

## 2018-07-19 NOTE — Telephone Encounter (Signed)
Spoke with patient L  Like your idea, lets see if we can arrnage for shoulder CT looking for evidence of fluid collection blood or puys as he says the shoulder is swollen Thanks SK

## 2018-07-19 NOTE — Telephone Encounter (Signed)
Followed up w/ pt.   Informed pt that both Dr. Caryl Comes and Dr. Curt Bears agree that he needs PT eval. Pt states that he will not go to PT, he can exercise his arm himself at home and not pay someone. States that after they took the stitch out middle of last month he had a few days of relief and then it has gradually gotten worse. He simply wants to know if this is related to his device or if it is something else.  If r/t to his device then he expects [place] to advise, if we think this is not r/t to device then he should be having this discussion w/ another physician. He is not interested in seeing PT, "when I can exercise my arm myself at home".  States that he was given instructions on arm exercises and he can do those at home.  States he has been moving his arm trying to work it. Offered pt appt on Monday to discuss further w/ Dr. Caryl Comes, pt doesn't know that this will help.  He just wants to know if this is device related and what to do. Will forward to Dr. Olin Pia nurse to follow up w/ pt

## 2018-07-20 ENCOUNTER — Ambulatory Visit: Payer: Medicare HMO

## 2018-07-20 ENCOUNTER — Ambulatory Visit (INDEPENDENT_AMBULATORY_CARE_PROVIDER_SITE_OTHER)
Admission: RE | Admit: 2018-07-20 | Discharge: 2018-07-20 | Disposition: A | Discharge status: Home / Self Care | Payer: Medicare HMO | Source: Ambulatory Visit | Attending: Internal Medicine | Admitting: Internal Medicine

## 2018-07-20 ENCOUNTER — Other Ambulatory Visit: Payer: Self-pay

## 2018-07-20 DIAGNOSIS — M25512 Pain in left shoulder: Secondary | ICD-10-CM

## 2018-07-20 NOTE — Telephone Encounter (Signed)
Pt has agreed to come in today for his scan at 1:15pm.

## 2018-07-24 ENCOUNTER — Ambulatory Visit (INDEPENDENT_AMBULATORY_CARE_PROVIDER_SITE_OTHER): Payer: Medicare HMO | Admitting: Internal Medicine

## 2018-07-24 ENCOUNTER — Encounter: Payer: Self-pay | Admitting: Internal Medicine

## 2018-07-24 ENCOUNTER — Other Ambulatory Visit: Payer: Self-pay

## 2018-07-24 VITALS — BP 126/86 | HR 53 | Ht 73.0 in | Wt 266.4 lb

## 2018-07-24 DIAGNOSIS — M25512 Pain in left shoulder: Secondary | ICD-10-CM | POA: Diagnosis not present

## 2018-07-24 NOTE — Progress Notes (Signed)
Patient Care Team: Pearson GrippeKim, James, MD as PCP - General (Internal Medicine)   HPI  Jonathan Fischer is a 48 y.o. male Seen in followup for an ICD implanted for complex coronary disease implanted November 2011. History of appropriate therapy for ventricular tachycardia with ICD discharge.  The rhythm strip was reviewed it was a monomorphic tachycardia    Device reached ERI 1/20 and underwent ICD generator replacement by Dr. Derek JackWC 5/20 as I was out of hospital because of COVID.  He has had complaints of shoulder pain dating back to the procedure.  It seems to be worsening.  Shoulder x-rays were negative.  Shoulder CT scan last week did not demonstrate any fluid collection suggestive of hematoma or infection DATE TEST EF   12/13 LHC 25-30% LAD t->>stent Cx-S, RCA-S patent  10/14 Myoview 36% Large anterior scar  7/18 LHC 25-30% mRCA 95% med rx   Date Cr K Hgb  7/18 0./83 4.6 17.5            Interval episodes of palpitations;?  Associated with documented VT-nonsustained  He underwent ICD generator replacement  Continues to smoke.  Daytime somnolence sleep disordered breathing.    Past Medical History:  Diagnosis Date  . Abnormal echocardiogram    a. 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  . AUTOMATIC IMPLANTABLE CARDIAC DEFIBRILLATOR SITU 03/03/2010   Qualifier: Diagnosis of  By: Graciela HusbandsKlein, MD, Ruthann CancerFACC, Ty HiltsSteven Cochran   . CAD (coronary artery disease)    s/p prior LAD, LCx and RCA stenting remotely for an anterior MI, recurrent MI in 2010 with LAD stent occlusion s/p thrombectomy and PTCA, STEMI 12/2011 s/p DES-prox LAD  . Cardiomyopathy, ischemic 12/14/2011  . Chronic systolic CHF (congestive heart failure) (HCC)   . HTN (hypertension)   . Hyperlipidemia   . ICD (implantable cardiac defibrillator) in place   . Ischemic cardiomyopathy    EF 25-30% s/p single-chamber Medtronic  ICD   . NSVT (nonsustained ventricular tachycardia) (HCC) 12/14/2011  . Obesity   . STEMI (ST elevation myocardial infarction) (HCC) 12/12/2011  . Tobacco abuse   . TOBACCO USER 10/29/2009   Qualifier: Diagnosis of  By: Graciela HusbandsKlein, MD, Susie CassetteFACC, Steven Cochran   . Tooth pain 12/14/2011  . VT (ventricular tachycardia) (HCC) 10/18/2012    Past Surgical History:  Procedure Laterality Date  . CARDIAC CATHETERIZATION  03/31/2009  . CARDIAC CATHETERIZATION  05/08/2008   CARDIAC SIZE AND SILHOUETTE NORMAL. THERE WAS ANTERIOR APICAL HYPOKINESIS WITH EF 35-40%  . CARDIAC DEFIBRILLATOR PLACEMENT  11/2009  . CORONARY ANGIOPLASTY     BALLOON ANGIOPLASTY TO THE LAD WITH THROMBUS EXTRACTION OF THE PREVIOUSLY STENTED SEGMENT AND NEW STENT PLACEMENT TO THE LEFT CIRCUMFLEX. EF IS DOWN IN THE 30% RANGE  . CORONARY ANGIOPLASTY WITH STENT PLACEMENT  12/12/2011   30% mid LCx, mid RCA & mid LCx stents patent. LVEF 25-30%, mid-distal anterolateral wall AK, apex dilated, dyskinetic s/p DES-prox LAD stenosis.  . CORONARY BALLOON ANGIOPLASTY N/A 07/19/2016   Procedure: Coronary Balloon Angioplasty;  Surgeon: SwazilandJordan, Peter M, MD;  [facility];  Service: Cardiovascular;  Laterality: N/A;  . defib    . ICD GENERATOR CHANGEOUT N/A 05/19/2018   Procedure: ICD GENERATOR CHANGEOUT;  Surgeon: Regan Lemmingamnitz, Will Martin, MD;  [facility];  Service: Cardiovascular;  Laterality: N/A;  . LEFT HEART CATH Bilateral 12/12/2011   Procedure: LEFT HEART  CATH;  Surgeon: Peter M Martinique, MD;  Location: [facility] CATH LAB;  Service: Cardiovascular;  Laterality: Bilateral;  . LEFT HEART CATH AND CORONARY ANGIOGRAPHY N/A 07/19/2016   Procedure: Left Heart Cath and Coronary Angiography;  Surgeon: Martinique, Peter M, MD;  Location: [place] CV LAB;  Service: Cardiovascular;  Laterality: N/A;  . LEFT HEART CATH AND CORONARY ANGIOGRAPHY N/A 03/14/2018   Procedure: LEFT HEART CATH AND CORONARY ANGIOGRAPHY;  Surgeon: Martinique, Peter M, MD;   Location: [place] CV LAB;  Service: Cardiovascular;  Laterality: N/A;    Current Outpatient Medications  Medication Sig Dispense Refill  . aspirin EC 81 MG EC tablet Take 1 tablet (81 mg total) by mouth daily. 30 tablet 3  . atorvastatin (LIPITOR) 80 MG tablet Take 1 tablet (80 mg total) by mouth daily at 6 PM. NEED OV. 90 tablet 3  . carvedilol (COREG) 12.5 MG tablet Take 1 tablet (12.5 mg total) by mouth 2 (two) times daily. 180 tablet 3  . cephALEXin (KEFLEX) 500 MG capsule Take 1 capsule (500 mg total) by mouth 3 (three) times daily. 15 capsule 0  . clopidogrel (PLAVIX) 75 MG tablet Take 75 mg by mouth daily.    [name] glipiZIDE (GLUCOTROL XL) 2.5 MG 24 hr tablet Take 2.5 mg by mouth every evening.    [name] ibuprofen (ADVIL) 200 MG tablet Take 800 mg by mouth daily as needed for moderate pain.    [name] lisinopril (ZESTRIL) 10 MG tablet Take 1 tablet (10 mg total) by mouth daily. 90 tablet 3  . nitroGLYCERIN (NITROSTAT) 0.4 MG SL tablet Place 1 tablet (0.4 mg total) under the tongue every 5 (five) minutes as needed. For chest pain 25 tablet 3  . oxymetazoline (AFRIN) 0.05 % nasal spray Place 1 spray into both nostrils 4 (four) times daily as needed for congestion.    . pantoprazole (PROTONIX) 40 MG tablet Take 1 tablet (40 mg total) by mouth every evening. 90 tablet 3   No current facility-administered medications for this visit.     No Known Allergies  Review of Systems negative except from HPI and PMH  Physical Exam BP 126/86   Pulse (!) 53   Ht 6\' 1"  (1.854 m)   Wt 266 lb 6.4 oz (120.8 kg)   SpO2 93%   BMI 35.15 kg/m  Well developed and nourished in no acute distress HENT normal Neck supple with JVP-  flat  Device pocket tender at the superior margin but without significant swelling; no discoloration  clear Regular rate and rhythm, no murmurs or gallops Abd-soft with active BS No Clubbing cyanosis edema Significant restriction in motion of shoulder abduction Skin-warm and dry A  & Oriented  Grossly normal sensory and motor function  G     Assessment and  Plan  Ventricular tachycardia    Ischemic cardiomyopathy    Congestive heart failure chronic systolic   TYobacco abuse__ still smoking  Not rady to pull the stopping trigger  Implantable defibrillator-Medtronic    VT-nonsustained-intercurrent-symptomatic  Shoulder pain probably adhesive capsulitis   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  We have discussed basic physical therapy i.e. climbing the wall.  He will let us know next week how he is doing.  If there is not significant interval improvement in the next 1-2 weeks, will refer him to physical therapy

## 2018-08-03 ENCOUNTER — Telehealth: Payer: Self-pay | Admitting: Internal Medicine

## 2018-08-03 NOTE — Telephone Encounter (Signed)
Discussed with Dr. Caryl Comes-- recommendation is for patient to be seen at [facility]. Plan to offer appointment tomorrow morning at 07:30 to see Dr. Caryl Comes for wound assessment.  Spoke with patient. Gave phone number, address, and hours for [facility]. Explained he should contact his insurance first to ensure they are in-network. Pt verbalizes understanding.   Pt asked if an orthopedic MD would have access to his CT and shoulder X-ray results. Explained that a [facility] provider will be able to see results, but that he will likely need to fill out a records request form for outside providers.  Discussed wound. Pt reports pinhole opening at incision, appeared the day before yesterday. Reports he is unable to tell if it is still draining as he "doesn't watch it 24/7" and he has been working in the yard today and his shirt is wet with sweat. No fever/chills, no further pus noted.  Offered appointment tomorrow at 07:30 to assess wound with Dr. Caryl Comes present. Pt declined appointment, reports that is too early for him. Asked him when he would be able to come in, but pt states he does not feel that coming in would benefit him and he would rather call back if he changes his mind. Offered an appointment next week, which pt also declined. Advised to seek emergency medical attention for fever or chills. Pt verbalizes understanding and denies further questions at this time.

## 2018-08-03 NOTE — Telephone Encounter (Signed)
Thanks. Noted.

## 2018-08-03 NOTE — Telephone Encounter (Signed)
New Message   Patient is calling in asking for a referral to see an Orthopaedic doctor for his shoulder. 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 Patient also has been having issues with Defibrillator, had a blister come up on the incision and had white puss in it, it popped and now it looks it is trying to scab up and is red.

## 2018-08-14 DIAGNOSIS — I10 Essential (primary) hypertension: Secondary | ICD-10-CM | POA: Diagnosis not present

## 2018-08-14 DIAGNOSIS — E118 Type 2 diabetes mellitus with unspecified complications: Secondary | ICD-10-CM | POA: Diagnosis not present

## 2018-08-14 DIAGNOSIS — E119 Type 2 diabetes mellitus without complications: Secondary | ICD-10-CM | POA: Diagnosis not present

## 2018-08-14 DIAGNOSIS — M25512 Pain in left shoulder: Secondary | ICD-10-CM | POA: Diagnosis not present

## 2018-08-16 DIAGNOSIS — I255 Ischemic cardiomyopathy: Secondary | ICD-10-CM | POA: Diagnosis not present

## 2018-08-16 DIAGNOSIS — I252 Old myocardial infarction: Secondary | ICD-10-CM | POA: Diagnosis not present

## 2018-08-16 DIAGNOSIS — M25512 Pain in left shoulder: Secondary | ICD-10-CM | POA: Diagnosis not present

## 2018-08-16 DIAGNOSIS — E669 Obesity, unspecified: Secondary | ICD-10-CM | POA: Diagnosis not present

## 2018-08-16 DIAGNOSIS — M7582 Other shoulder lesions, left shoulder: Secondary | ICD-10-CM | POA: Diagnosis not present

## 2018-08-16 DIAGNOSIS — M7552 Bursitis of left shoulder: Secondary | ICD-10-CM | POA: Diagnosis not present

## 2018-08-16 DIAGNOSIS — E119 Type 2 diabetes mellitus without complications: Secondary | ICD-10-CM | POA: Diagnosis not present

## 2018-08-28 ENCOUNTER — Ambulatory Visit (INDEPENDENT_AMBULATORY_CARE_PROVIDER_SITE_OTHER): Payer: Medicare HMO | Admitting: *Deleted

## 2018-08-28 ENCOUNTER — Encounter: Payer: Medicare HMO | Admitting: Internal Medicine

## 2018-08-28 DIAGNOSIS — I255 Ischemic cardiomyopathy: Secondary | ICD-10-CM

## 2018-08-28 DIAGNOSIS — E785 Hyperlipidemia, unspecified: Secondary | ICD-10-CM | POA: Diagnosis not present

## 2018-08-28 DIAGNOSIS — I252 Old myocardial infarction: Secondary | ICD-10-CM | POA: Diagnosis not present

## 2018-08-28 DIAGNOSIS — I472 Ventricular tachycardia, unspecified: Secondary | ICD-10-CM

## 2018-08-28 DIAGNOSIS — E119 Type 2 diabetes mellitus without complications: Secondary | ICD-10-CM | POA: Diagnosis not present

## 2018-08-29 ENCOUNTER — Telehealth: Payer: Self-pay

## 2018-08-29 NOTE — Telephone Encounter (Signed)
Spoke with patient to remind of missed remote transmission 

## 2018-08-30 DIAGNOSIS — M7582 Other shoulder lesions, left shoulder: Secondary | ICD-10-CM | POA: Diagnosis not present

## 2018-08-30 DIAGNOSIS — I252 Old myocardial infarction: Secondary | ICD-10-CM | POA: Diagnosis not present

## 2018-08-30 DIAGNOSIS — M25512 Pain in left shoulder: Secondary | ICD-10-CM | POA: Diagnosis not present

## 2018-08-30 DIAGNOSIS — E669 Obesity, unspecified: Secondary | ICD-10-CM | POA: Diagnosis not present

## 2018-08-30 DIAGNOSIS — E119 Type 2 diabetes mellitus without complications: Secondary | ICD-10-CM | POA: Diagnosis not present

## 2018-08-30 DIAGNOSIS — I255 Ischemic cardiomyopathy: Secondary | ICD-10-CM | POA: Diagnosis not present

## 2018-08-30 DIAGNOSIS — M7552 Bursitis of left shoulder: Secondary | ICD-10-CM | POA: Diagnosis not present

## 2018-08-30 LAB — CUP PACEART REMOTE DEVICE CHECK
Battery Remaining Longevity: 136 mo
Battery Voltage: 3.14 V
Brady Statistic RV Percent Paced: 0.03 %
Date Time Interrogation Session: 20200819071040
HighPow Impedance: 58 Ohm
HighPow Impedance: 88 Ohm
Implantable Lead Implant Date: 20111102
Implantable Lead Location: 753860
Implantable Lead Model: 6947
Implantable Pulse Generator Implant Date: 20200508
Lead Channel Impedance Value: 380 Ohm
Lead Channel Impedance Value: 456 Ohm
Lead Channel Pacing Threshold Amplitude: 0.875 V
Lead Channel Pacing Threshold Pulse Width: 0.4 ms
Lead Channel Sensing Intrinsic Amplitude: 20.125 mV
Lead Channel Sensing Intrinsic Amplitude: 20.125 mV
Lead Channel Setting Pacing Amplitude: 2.5 V
Lead Channel Setting Pacing Pulse Width: 0.4 ms
Lead Channel Setting Sensing Sensitivity: 0.3 mV

## 2018-09-05 NOTE — Progress Notes (Signed)
Remote ICD transmission.   

## 2018-09-06 DIAGNOSIS — M25512 Pain in left shoulder: Secondary | ICD-10-CM | POA: Diagnosis not present

## 2018-09-27 ENCOUNTER — Other Ambulatory Visit: Payer: Self-pay | Admitting: Cardiology

## 2018-10-04 DIAGNOSIS — M7542 Impingement syndrome of left shoulder: Secondary | ICD-10-CM | POA: Diagnosis not present

## 2018-10-04 DIAGNOSIS — M25512 Pain in left shoulder: Secondary | ICD-10-CM | POA: Diagnosis not present

## 2018-11-22 ENCOUNTER — Other Ambulatory Visit: Payer: Self-pay | Admitting: Orthopedic Surgery

## 2018-11-22 DIAGNOSIS — M25512 Pain in left shoulder: Secondary | ICD-10-CM

## 2018-11-27 ENCOUNTER — Ambulatory Visit (INDEPENDENT_AMBULATORY_CARE_PROVIDER_SITE_OTHER): Payer: Medicare HMO | Admitting: *Deleted

## 2018-11-27 ENCOUNTER — Other Ambulatory Visit: Payer: Self-pay | Admitting: Orthopedic Surgery

## 2018-11-27 DIAGNOSIS — M25512 Pain in left shoulder: Secondary | ICD-10-CM

## 2018-11-27 DIAGNOSIS — I472 Ventricular tachycardia, unspecified: Secondary | ICD-10-CM

## 2018-11-27 DIAGNOSIS — I255 Ischemic cardiomyopathy: Secondary | ICD-10-CM

## 2018-11-28 ENCOUNTER — Other Ambulatory Visit: Payer: Self-pay | Admitting: Cardiology

## 2018-11-28 LAB — CUP PACEART REMOTE DEVICE CHECK
Battery Remaining Longevity: 135 mo
Battery Voltage: 3.09 V
Brady Statistic RV Percent Paced: 0.02 %
Date Time Interrogation Session: 20201116141705
HighPow Impedance: 53 Ohm
HighPow Impedance: 71 Ohm
Implantable Lead Implant Date: 20111102
Implantable Lead Location: 753860
Implantable Lead Model: 6947
Implantable Pulse Generator Implant Date: 20200508
Lead Channel Impedance Value: 399 Ohm
Lead Channel Impedance Value: 437 Ohm
Lead Channel Pacing Threshold Amplitude: 1 V
Lead Channel Pacing Threshold Pulse Width: 0.4 ms
Lead Channel Sensing Intrinsic Amplitude: 21 mV
Lead Channel Sensing Intrinsic Amplitude: 21 mV
Lead Channel Setting Pacing Amplitude: 2.5 V
Lead Channel Setting Pacing Pulse Width: 0.4 ms
Lead Channel Setting Sensing Sensitivity: 0.3 mV

## 2018-12-04 ENCOUNTER — Ambulatory Visit (HOSPITAL_COMMUNITY)
Admission: RE | Admit: 2018-12-04 | Discharge: 2018-12-04 | Disposition: A | Discharge status: Home / Self Care | Payer: Medicare HMO | Source: Ambulatory Visit | Attending: Orthopedic Surgery | Admitting: Orthopedic Surgery

## 2018-12-04 ENCOUNTER — Other Ambulatory Visit: Payer: Self-pay

## 2018-12-04 ENCOUNTER — Ambulatory Visit (HOSPITAL_COMMUNITY): Admission: RE | Admit: 2018-12-04 | Payer: Medicare HMO | Source: Ambulatory Visit

## 2018-12-04 ENCOUNTER — Encounter (HOSPITAL_COMMUNITY): Payer: Self-pay

## 2018-12-04 DIAGNOSIS — M25512 Pain in left shoulder: Secondary | ICD-10-CM | POA: Insufficient documentation

## 2018-12-11 DIAGNOSIS — M25512 Pain in left shoulder: Secondary | ICD-10-CM | POA: Diagnosis not present

## 2018-12-12 ENCOUNTER — Telehealth: Payer: Self-pay | Admitting: *Deleted

## 2018-12-12 NOTE — Telephone Encounter (Signed)
   [facility] Pre-operative Risk Assessment    Request for surgical clearance:  1. What type of surgery is being performed? LEFT SHOULDER SCOPE   2. When is this surgery scheduled? 12/21/18   3. What type of clearance is required (medical clearance vs. Pharmacy clearance to hold med vs. Both)? MEDICAL  4. Are there any medications that need to be held prior to surgery and how long? PLAVIX    5. Practice name and name of physician performing surgery? [facility]; DR. Lennette Bihari SUPPLE   6. What is your office phone number [phone]    7.   What is your office fax number [phone]  8.   Anesthesia type (None, local, MAC, general) ? GENERAL   Jonathan Fischer 12/12/2018, 11:20 AM  _________________________________________________________________   (provider comments below)

## 2018-12-12 NOTE — Telephone Encounter (Signed)
Dr. Caryl Comes, this patient is scheduled for shoulder scope with request for medical clearance and to hold plavix for the procedure.   I called and spoke to the patient regarding his current status. He is having no cardiac complaints. He is not very active and continues to smoke.   He says that he is skeptical that his shoulder pain is related to bone spurs as he has been told in planning for this procedure. His pain began after his ICD Gen change. He says that he has been told in the past to never go off of his Plavix due to his significant cardiac history. He would like for Dr. Caryl Comes to provide his input on the situation. He wonders if having Dr. Caryl Comes review his MRI would be possible.   Can you please provide input, possibly call pt to discuss.   Please route response back to P CV DIV PREOP  Thank you

## 2018-12-15 NOTE — Patient Instructions (Signed)
DUE TO COVID-19 ONLY ONE VISITOR IS ALLOWED TO COME WITH YOU AND STAY IN THE WAITING ROOM ONLY DURING PRE OP AND PROCEDURE DAY OF SURGERY. THE 1 VISITOR MAY VISIT WITH YOU AFTER SURGERY IN YOUR PRIVATE ROOM DURING VISITING HOURS ONLY!   ONCE YOUR COVID TEST IS COMPLETED, PLEASE BEGIN THE QUARANTINE INSTRUCTIONS AS OUTLINED IN YOUR HANDOUT.                Adline Potter II     Your procedure is scheduled on: Thursday 12/21/2018   Report to [facility] Main  Entrance    Report to admitting at   1020 am AM   How to Manage Your Diabetes Before and After Surgery  Why is it important to control my blood sugar before and after surgery? . Improving blood sugar levels before and after surgery helps healing and can limit problems. . A way of improving blood sugar control is eating a healthy diet by: o  Eating less sugar and carbohydrates o  Increasing activity/exercise o  Talking with your doctor about reaching your blood sugar goals . High blood sugars (greater than 180 mg/dL) can raise your risk of infections and slow your recovery, so you will need to focus on controlling your diabetes during the weeks before surgery. . Make sure that the doctor who takes care of your diabetes knows about your planned surgery including the date and location.  How do I manage my blood sugar before surgery? . Check your blood sugar at least 4 times a day, starting 2 days before surgery, to make sure that the level is not too high or low. o Check your blood sugar the morning of your surgery when you wake up and every 2 hours until you get to the Short Stay unit. . If your blood sugar is less than 70 mg/dL, you will need to treat for low blood sugar: o Do not take insulin. o Treat a low blood sugar (less than 70 mg/dL) with  cup of clear juice (cranberry or apple), 4 glucose tablets, OR glucose gel. o Recheck blood sugar in 15 minutes after treatment (to make sure it is greater than 70 mg/dL). If  your blood sugar is not greater than 70 mg/dL on recheck, call [phone] for further instructions. . Report your blood sugar to the short stay nurse when you get to Short Stay.  . If you are admitted to the hospital after surgery: o Your blood sugar will be checked by the staff and you will probably be given insulin after surgery (instead of oral diabetes medicines) to make sure you have good blood sugar levels. o The goal for blood sugar control after surgery is 80-180 mg/dL.   WHAT DO I DO ABOUT MY DIABETES MEDICATION?        The day before surgery, Take the morning dose only of Glipizide (Glucotrol XL)!  [name] Do not take oral diabetes medicines (pills) the morning of surgery.      Call this number if you have problems the morning of surgery [phone]    Remember: Do not eat food  :After Midnight.     NO SOLID FOOD AFTER MIDNIGHT THE NIGHT PRIOR TO SURGERY. NOTHING BY MOUTH EXCEPT CLEAR LIQUIDS UNTIL   0920 am .     PLEASE FINISH  Gatorade zero  DRINK PER SURGEON ORDER  WHICH NEEDS TO BE COMPLETED AT  0920 am .   CLEAR LIQUID DIET   Foods Allowed  Foods Excluded  Coffee and tea, regular and decaf                             liquids that you cannot  Plain Jell-O any favor except red or purple                                           see through such as: Fruit ices (not with fruit pulp)                                     milk, soups, orange juice  Iced Popsicles                                    All solid food Carbonated beverages, regular and diet                                    Cranberry, grape and apple juices Sports drinks like Gatorade Lightly seasoned clear broth or consume(fat free) Sugar, honey syrup  Sample Menu Breakfast                                Lunch                                     Supper Cranberry juice                    Beef broth                            Chicken  broth Jell-O                                     Grape juice                           Apple juice Coffee or tea                        Jell-O                                      Popsicle                                                Coffee or tea                        Coffee or tea  _____________________________________________________________________      BRUSH YOUR TEETH MORNING OF SURGERY AND RINSE YOUR MOUTH OUT, NO CHEWING GUM CANDY OR MINTS.  Take these medicines the morning of surgery with A SIP OF WATER: Carvedilol (Coreg)   DO NOT TAKE ANY DIABETIC MEDICATIONS DAY OF YOUR SURGERY!                               You may not have any metal on your body including hair pins and              piercings  Do not wear jewelry, make-up, lotions, powders or perfumes, deodorant                          Men may shave face and neck.   Do not bring valuables to the hospital. [facility] IS NOT             RESPONSIBLE   FOR VALUABLES.  Contacts, dentures or bridgework may not be worn into surgery.  Leave suitcase in the car. After surgery it may be brought to your room.     Patients discharged the day of surgery will not be allowed to drive home. IF YOU ARE HAVING SURGERY AND GOING HOME THE SAME DAY, YOU MUST HAVE AN ADULT TO DRIVE YOU HOME AND BE WITH YOU FOR 24 HOURS. YOU MAY GO HOME BY TAXI OR UBER OR ORTHERWISE, BUT AN ADULT MUST ACCOMPANY YOU HOME AND STAY WITH YOU FOR 24 HOURS.  Name and phone number of your driver:                Please read over the following fact sheets you were given: _____________________________________________________________________             [facility] Before surgery, you can play an important role.  Because skin is not sterile, your skin needs to be as free of germs as possible.  You can reduce the number of germs on your skin by washing with CHG (chlorahexidine gluconate) soap before surgery.  CHG is an antiseptic  cleaner which kills germs and bonds with the skin to continue killing germs even after washing. Please DO NOT use if you have an allergy to CHG or antibacterial soaps.  If your skin becomes reddened/irritated stop using the CHG and inform your nurse when you arrive at Short Stay. Do not shave (including legs and underarms) for at least 48 hours prior to the first CHG shower.  You may shave your face/neck. Please follow these instructions carefully:  1.  Shower with CHG Soap the night before surgery and the  morning of Surgery.  2.  If you choose to wash your hair, wash your hair first as usual with your  normal  shampoo.  3.  After you shampoo, rinse your hair and body thoroughly to remove the  shampoo.                           4.  Use CHG as you would any other liquid soap.  You can apply chg directly  to the skin and wash                       Gently with a scrungie or clean washcloth.  5.  Apply the CHG Soap to your body ONLY FROM THE NECK DOWN.   Do not use on face/ open  Wound or open sores. Avoid contact with eyes, ears mouth and genitals (private parts).                       Wash face,  Genitals (private parts) with your normal soap.             6.  Wash thoroughly, paying special attention to the area where your surgery  will be performed.  7.  Thoroughly rinse your body with warm water from the neck down.  8.  DO NOT shower/wash with your normal soap after using and rinsing off  the CHG Soap.                9.  Pat yourself dry with a clean towel.            10.  Wear clean pajamas.            11.  Place clean sheets on your bed the night of your first shower and do not  sleep with pets. Day of Surgery : Do not apply any lotions/deodorants the morning of surgery.  Please wear clean clothes to the hospital/surgery center.  FAILURE TO FOLLOW THESE INSTRUCTIONS MAY RESULT IN THE CANCELLATION OF YOUR SURGERY PATIENT  SIGNATURE_________________________________  NURSE SIGNATURE__________________________________  ________________________________________________________________________   Adam Phenix  An incentive spirometer is a tool that can help keep your lungs clear and active. This tool measures how well you are filling your lungs with each breath. Taking long deep breaths may help reverse or decrease the chance of developing breathing (pulmonary) problems (especially infection) following:  A long period of time when you are unable to move or be active. BEFORE THE PROCEDURE   If the spirometer includes an indicator to show your best effort, your nurse or respiratory therapist will set it to a desired goal.  If possible, sit up straight or lean slightly forward. Try not to slouch.  Hold the incentive spirometer in an upright position. INSTRUCTIONS FOR USE  1. Sit on the edge of your bed if possible, or sit up as far as you can in bed or on a chair. 2. Hold the incentive spirometer in an upright position. 3. Breathe out normally. 4. Place the mouthpiece in your mouth and seal your lips tightly around it. 5. Breathe in slowly and as deeply as possible, raising the piston or the ball toward the top of the column. 6. Hold your breath for 3-5 seconds or for as long as possible. Allow the piston or ball to fall to the bottom of the column. 7. Remove the mouthpiece from your mouth and breathe out normally. 8. Rest for a few seconds and repeat Steps 1 through 7 at least 10 times every 1-2 hours when you are awake. Take your time and take a few normal breaths between deep breaths. 9. The spirometer may include an indicator to show your best effort. Use the indicator as a goal to work toward during each repetition. 10. After each set of 10 deep breaths, practice coughing to be sure your lungs are clear. If you have an incision (the cut made at the time of surgery), support your incision when coughing  by placing a pillow or rolled up towels firmly against it. Once you are able to get out of bed, walk around indoors and cough well. You may stop using the incentive spirometer when instructed by your caregiver.  RISKS AND COMPLICATIONS  Take your time so you do not  get dizzy or light-headed.  If you are in pain, you may need to take or ask for pain medication before doing incentive spirometry. It is harder to take a deep breath if you are having pain. AFTER USE  Rest and breathe slowly and easily.  It can be helpful to keep track of a log of your progress. Your caregiver can provide you with a simple table to help with this. If you are using the spirometer at home, follow these instructions: [place] IF:   You are having difficultly using the spirometer.  You have trouble using the spirometer as often as instructed.  Your pain medication is not giving enough relief while using the spirometer.  You develop fever of 100.5 F (38.1 C) or higher. SEEK IMMEDIATE MEDICAL CARE IF:   You cough up bloody sputum that had not been present before.  You develop fever of 102 F (38.9 C) or greater.  You develop worsening pain at or near the incision site. MAKE SURE YOU:   Understand these instructions.  Will watch your condition.  Will get help right away if you are not doing well or get worse. Document Released: 05/10/2006 Document Revised: 03/22/2011 Document Reviewed: 07/11/2006 [facility] Patient Information 2014 [place], [place].   ________________________________________________________________________

## 2018-12-18 ENCOUNTER — Other Ambulatory Visit (HOSPITAL_COMMUNITY)
Admission: RE | Admit: 2018-12-18 | Discharge: 2018-12-18 | Disposition: A | Discharge status: Home / Self Care | Payer: Medicare HMO | Source: Ambulatory Visit | Attending: Orthopedic Surgery | Admitting: Orthopedic Surgery

## 2018-12-18 DIAGNOSIS — Z20828 Contact with and (suspected) exposure to other viral communicable diseases: Secondary | ICD-10-CM | POA: Diagnosis not present

## 2018-12-18 DIAGNOSIS — Z01812 Encounter for preprocedural laboratory examination: Secondary | ICD-10-CM | POA: Diagnosis not present

## 2018-12-18 NOTE — Telephone Encounter (Signed)
He can hold Plavix for his procedure for 5 days prior  Saysha Menta Martinique MD, [facility]

## 2018-12-18 NOTE — Telephone Encounter (Signed)
Dr Martinique this patient is scheduled to have arthroscopic shoulder surgery on 12/10.  He is on Plavix, last PCI/DES was 2013.  He has been told "you can never stop it".  Original clearance request was sent to Dr Caryl Comes who follows him for his ICD- it probably should have gone to you since you follow his CAD.  Can you give me some parameters on holding his Plavix pre op?  Kerin Ransom PA-C 12/18/2018 9:21 AM

## 2018-12-18 NOTE — Telephone Encounter (Addendum)
   Primary Cardiologist: Dr Martinique  Chart reviewed and patient contacted by phone today as part of pre-operative protocol coverage. Given past medical history and time since last visit, based on ACC/AHA guidelines, Jonathan Fischer would be at acceptable risk for the planned procedure without further cardiovascular testing.   OK to hold Plavix up to 5 days pre op if needed.  I will route this recommendation to the requesting party via Epic fax function and remove from pre-op pool.  Please call with questions.  Kerin Ransom, PA-C 12/18/2018, 9:57 AM

## 2018-12-19 ENCOUNTER — Encounter (HOSPITAL_COMMUNITY)
Admission: RE | Admit: 2018-12-19 | Discharge: 2018-12-19 | Disposition: A | Discharge status: Home / Self Care | Payer: Medicare HMO | Source: Ambulatory Visit | Attending: Internal Medicine | Admitting: Internal Medicine

## 2018-12-19 ENCOUNTER — Encounter (HOSPITAL_COMMUNITY): Payer: Self-pay

## 2018-12-19 ENCOUNTER — Other Ambulatory Visit: Payer: Self-pay

## 2018-12-19 ENCOUNTER — Encounter (HOSPITAL_COMMUNITY)
Admission: RE | Admit: 2018-12-19 | Discharge: 2018-12-19 | Disposition: A | Discharge status: Home / Self Care | Payer: Medicare HMO | Source: Ambulatory Visit | Attending: Orthopedic Surgery | Admitting: Orthopedic Surgery

## 2018-12-19 DIAGNOSIS — Z6834 Body mass index (BMI) 34.0-34.9, adult: Secondary | ICD-10-CM | POA: Diagnosis not present

## 2018-12-19 DIAGNOSIS — Z955 Presence of coronary angioplasty implant and graft: Secondary | ICD-10-CM | POA: Insufficient documentation

## 2018-12-19 DIAGNOSIS — F17210 Nicotine dependence, cigarettes, uncomplicated: Secondary | ICD-10-CM | POA: Diagnosis not present

## 2018-12-19 DIAGNOSIS — I255 Ischemic cardiomyopathy: Secondary | ICD-10-CM | POA: Diagnosis not present

## 2018-12-19 DIAGNOSIS — I2510 Atherosclerotic heart disease of native coronary artery without angina pectoris: Secondary | ICD-10-CM | POA: Insufficient documentation

## 2018-12-19 DIAGNOSIS — E785 Hyperlipidemia, unspecified: Secondary | ICD-10-CM | POA: Diagnosis not present

## 2018-12-19 DIAGNOSIS — I110 Hypertensive heart disease with heart failure: Secondary | ICD-10-CM | POA: Diagnosis not present

## 2018-12-19 DIAGNOSIS — Z01812 Encounter for preprocedural laboratory examination: Secondary | ICD-10-CM | POA: Diagnosis not present

## 2018-12-19 DIAGNOSIS — I252 Old myocardial infarction: Secondary | ICD-10-CM | POA: Insufficient documentation

## 2018-12-19 DIAGNOSIS — Z7984 Long term (current) use of oral hypoglycemic drugs: Secondary | ICD-10-CM | POA: Diagnosis not present

## 2018-12-19 DIAGNOSIS — E669 Obesity, unspecified: Secondary | ICD-10-CM | POA: Diagnosis not present

## 2018-12-19 DIAGNOSIS — M7542 Impingement syndrome of left shoulder: Secondary | ICD-10-CM | POA: Diagnosis not present

## 2018-12-19 DIAGNOSIS — Z7982 Long term (current) use of aspirin: Secondary | ICD-10-CM | POA: Insufficient documentation

## 2018-12-19 DIAGNOSIS — Z79899 Other long term (current) drug therapy: Secondary | ICD-10-CM | POA: Diagnosis not present

## 2018-12-19 DIAGNOSIS — I5022 Chronic systolic (congestive) heart failure: Secondary | ICD-10-CM | POA: Diagnosis not present

## 2018-12-19 DIAGNOSIS — Z95810 Presence of automatic (implantable) cardiac defibrillator: Secondary | ICD-10-CM | POA: Insufficient documentation

## 2018-12-19 DIAGNOSIS — M19012 Primary osteoarthritis, left shoulder: Secondary | ICD-10-CM | POA: Diagnosis not present

## 2018-12-19 HISTORY — DX: Unspecified osteoarthritis, unspecified site: M19.90

## 2018-12-19 HISTORY — DX: Headache, unspecified: R51.9

## 2018-12-19 LAB — CBC
HCT: 48.2 % (ref 39.0–52.0)
Hemoglobin: 16.5 g/dL (ref 13.0–17.0)
MCH: 32.5 pg (ref 26.0–34.0)
MCHC: 34.2 g/dL (ref 30.0–36.0)
MCV: 95.1 fL (ref 80.0–100.0)
Platelets: 214 10*3/uL (ref 150–400)
RBC: 5.07 MIL/uL (ref 4.22–5.81)
RDW: 12.6 % (ref 11.5–15.5)
WBC: 7.6 10*3/uL (ref 4.0–10.5)
nRBC: 0 % (ref 0.0–0.2)

## 2018-12-19 LAB — BASIC METABOLIC PANEL
Anion gap: 9 (ref 5–15)
BUN: 11 mg/dL (ref 6–20)
CO2: 27 mmol/L (ref 22–32)
Calcium: 9.1 mg/dL (ref 8.9–10.3)
Chloride: 101 mmol/L (ref 98–111)
Creatinine, Ser: 0.86 mg/dL (ref 0.61–1.24)
GFR calc Af Amer: >60 mL/min (ref >60)
GFR calc non Af Amer: >60 mL/min (ref >60)
Glucose, Bld: 181 mg/dL — ABNORMAL HIGH (ref 70–99)
Potassium: 4.4 mmol/L (ref 3.5–5.1)
Sodium: 137 mmol/L (ref 135–145)

## 2018-12-19 LAB — GLUCOSE, CAPILLARY: Glucose-Capillary: 194 mg/dL — ABNORMAL HIGH (ref 70–99)

## 2018-12-19 LAB — NOVEL CORONAVIRUS, NAA (HOSP ORDER, SEND-OUT TO REF LAB; TAT 18-24 HRS): SARS-CoV-2, NAA: NOT DETECTED

## 2018-12-19 NOTE — Progress Notes (Signed)
PCP -Dr. Jani Gravel   LOV- 08/28/2018  EPIC Cardiologist - Dr. Martinique, Dr. Caryl Comes  LOV- 07/24/2018 12/12/2018 LOV with Daune Perch, NP  EPIC 12/18/2018-Cardiac clearance in Epic and on chart by [place] check (for AICD)-11/28/2018 11/2009-AICD implanted [employer] 11/1998-Cardiac defib replacement 05/19/2018- ICD generator change   Chest x-ray - n/a  EKG -03/08/2018 EPIC Stress Test -11/08/2012 EPIC ECHO - 11/06/2012  EPIC Cardiac Cath - 03/14/2018, 07/19/2016-with balloon angioplasty, 12/12/2011-with coronary angioplasty, 03/31/2009,05/08/2008  Sleep Study - n/a CPAP - n/a  Fasting Blood Sugar - 120's Checks Blood Sugar __1___ times a  week  Blood Thinner Instructions:Plavix . Instructions given to patient 12/18/2018 in the afternoon was to hold Plavix 5 days before surgery on 12/21/2018.Last dose of Plavix was 12/16/2018. Aspirin Instructions:Stopped Aspirin 81 mg 12/16/2018 (last dose) Last Dose:12/16/2018  Anesthesia review:   Chart given to Konrad Felix, PA to review patient's medical history and BMP.  Patient has a history of Heart Attack (first heart attack was at age -2, last MI was 2018 (for a total of 7 MI's all together), had a STEMI in 12/12/2011 with Ischemic cardiomyopathy, CAD,  DM,CHF, HTN,  And AICD implanted.  Patient denies shortness of breath, fever, cough and chest pain at PAT appointment   Patient verbalized understanding of instructions that were given to them at the PAT appointment. Patient was also instructed that they will need to review over the PAT instructions again at home before surgery.

## 2018-12-19 NOTE — Patient Instructions (Addendum)
DUE TO COVID-19 ONLY ONE VISITOR IS ALLOWED TO COME WITH YOU AND STAY IN THE WAITING ROOM ONLY DURING PRE OP AND PROCEDURE DAY OF SURGERY. THE 1 VISITOR MAY VISIT WITH YOU AFTER SURGERY IN YOUR PRIVATE ROOM DURING VISITING HOURS ONLY!   ONCE YOUR COVID TEST IS COMPLETED, PLEASE BEGIN THE QUARANTINE INSTRUCTIONS AS OUTLINED IN YOUR HANDOUT.                Jonathan Fischer     Your procedure is scheduled on: Thursday 12/21/2018   Report to [facility] Main  Entrance    Report to admitting at  1020  AM    How to Manage Your Diabetes Before and After Surgery  Why is it important to control my blood sugar before and after surgery? . Improving blood sugar levels before and after surgery helps healing and can limit problems. . A way of improving blood sugar control is eating a healthy diet by: o  Eating less sugar and carbohydrates o  Increasing activity/exercise o  Talking with your doctor about reaching your blood sugar goals . High blood sugars (greater than 180 mg/dL) can raise your risk of infections and slow your recovery, so you will need to focus on controlling your diabetes during the weeks before surgery. . Make sure that the doctor who takes care of your diabetes knows about your planned surgery including the date and location.  How do I manage my blood sugar before surgery? . Check your blood sugar at least 4 times a day, starting 2 days before surgery, to make sure that the level is not too high or low. o Check your blood sugar the morning of your surgery when you wake up and every 2 hours until you get to the Short Stay unit. . If your blood sugar is less than 70 mg/dL, you will need to treat for low blood sugar: o Do not take insulin. o Treat a low blood sugar (less than 70 mg/dL) with  cup of clear juice (cranberry or apple), 4 glucose tablets, OR glucose gel. o Recheck blood sugar in 15 minutes after treatment (to make sure it is greater than 70 mg/dL). If your  blood sugar is not greater than 70 mg/dL on recheck, call [phone] for further instructions. . Report your blood sugar to the short stay nurse when you get to Short Stay.  . If you are admitted to the hospital after surgery: o Your blood sugar will be checked by the staff and you will probably be given insulin after surgery (instead of oral diabetes medicines) to make sure you have good blood sugar levels. o The goal for blood sugar control after surgery is 80-180 mg/dL.   WHAT DO I DO ABOUT MY DIABETES MEDICATION?        The day before surgery, Take Glipizide ( Glucotrol XL) as prescribed!  . Do not take oral diabetes medicines (pills) the morning of surgery.      Call this number if you have problems the morning of surgery [phone]    Remember: Do not eat food  :After Midnight.    NO SOLID FOOD AFTER MIDNIGHT THE NIGHT PRIOR TO SURGERY. NOTHING BY MOUTH EXCEPT CLEAR LIQUIDS UNTIL 0920 am .     PLEASE FINISH Gatorade G 2 DRINK PER SURGEON ORDER  WHICH NEEDS TO BE COMPLETED AT 0920 am .   CLEAR LIQUID DIET   Foods Allowed  Foods Excluded  Coffee and tea, regular and decaf                             liquids that you cannot  Plain Jell-O any favor except red or purple                                           see through such as: Fruit ices (not with fruit pulp)                                     milk, soups, orange juice  Iced Popsicles                                    All solid food Carbonated beverages, regular and diet                                    Cranberry, grape and apple juices Sports drinks like Gatorade Lightly seasoned clear broth or consume(fat free) Sugar, honey syrup  Sample Menu Breakfast                                Lunch                                     Supper Cranberry juice                    Beef broth                            Chicken broth Jell-O                                      Grape juice                           Apple juice Coffee or tea                        Jell-O                                      Popsicle                                                Coffee or tea                        Coffee or tea  _____________________________________________________________________     BRUSH YOUR TEETH MORNING OF SURGERY AND RINSE YOUR MOUTH OUT, NO CHEWING GUM CANDY OR MINTS.  Take these medicines the morning of surgery with A SIP OF WATER: Carvedilol (Coreg)              DO NOT TAKE ANY DIABETIC MEDICATIONS DAY OF YOUR SURGERY!                               You may not have any metal on your body including hair pins and              piercings  Do not wear jewelry, make-up, lotions, powders or perfumes, deodorant                          Men may shave face and neck.   Do not bring valuables to the hospital. [place].  Contacts, dentures or bridgework may not be worn into surgery.  Leave suitcase in the car. After surgery it may be brought to your room.     Patients discharged the day of surgery will not be allowed to drive home. IF YOU ARE HAVING SURGERY AND GOING HOME THE SAME DAY, YOU MUST HAVE AN ADULT TO DRIVE YOU HOME AND BE WITH YOU FOR 24 HOURS. YOU MAY GO HOME BY TAXI OR UBER OR ORTHERWISE, BUT AN ADULT MUST ACCOMPANY YOU HOME AND STAY WITH YOU FOR 24 HOURS.  Name and phone number of your driver:spouse- Jonathan Fischer                Please read over the following fact sheets you were given: _____________________________________________________________________             [facility] - Preparing for Surgery Before surgery, you can play an important role.  Because skin is not sterile, your skin needs to be as free of germs as possible.  You can reduce the number of germs on your skin by washing with CHG (chlorahexidine gluconate) soap before surgery.  CHG is an antiseptic cleaner which  kills germs and bonds with the skin to continue killing germs even after washing. Please DO NOT use if you have an allergy to CHG or antibacterial soaps.  If your skin becomes reddened/irritated stop using the CHG and inform your nurse when you arrive at Short Stay. Do not shave (including legs and underarms) for at least 48 hours prior to the first CHG shower.  You may shave your face/neck. Please follow these instructions carefully:  1.  Shower with CHG Soap the night before surgery and the  morning of Surgery.  2.  If you choose to wash your hair, wash your hair first as usual with your  normal  shampoo.  3.  After you shampoo, rinse your hair and body thoroughly to remove the  shampoo.                           4.  Use CHG as you would any other liquid soap.  You can apply chg directly  to the skin and wash                       Gently with a scrungie or clean washcloth.  5.  Apply the CHG Soap to your body ONLY FROM THE NECK DOWN.   Do not use on face/ open  Wound or open sores. Avoid contact with eyes, ears mouth and genitals (private parts).                       Wash face,  Genitals (private parts) with your normal soap.             6.  Wash thoroughly, paying special attention to the area where your surgery  will be performed.  7.  Thoroughly rinse your body with warm water from the neck down.  8.  DO NOT shower/wash with your normal soap after using and rinsing off  the CHG Soap.                9.  Pat yourself dry with a clean towel.            10.  Wear clean pajamas.            11.  Place clean sheets on your bed the night of your first shower and do not  sleep with pets. Day of Surgery : Do not apply any lotions/deodorants the morning of surgery.  Please wear clean clothes to the hospital/surgery center.  FAILURE TO FOLLOW THESE INSTRUCTIONS MAY RESULT IN THE CANCELLATION OF YOUR SURGERY PATIENT SIGNATURE_________________________________  NURSE  SIGNATURE__________________________________  ________________________________________________________________________   Jonathan Fischer  An incentive spirometer is a tool that can help keep your lungs clear and active. This tool measures how well you are filling your lungs with each breath. Taking long deep breaths may help reverse or decrease the chance of developing breathing (pulmonary) problems (especially infection) following:  A long period of time when you are unable to move or be active. BEFORE THE PROCEDURE   If the spirometer includes an indicator to show your best effort, your nurse or respiratory therapist will set it to a desired goal.  If possible, sit up straight or lean slightly forward. Try not to slouch.  Hold the incentive spirometer in an upright position. INSTRUCTIONS FOR USE  1. Sit on the edge of your bed if possible, or sit up as far as you can in bed or on a chair. 2. Hold the incentive spirometer in an upright position. 3. Breathe out normally. 4. Place the mouthpiece in your mouth and seal your lips tightly around it. 5. Breathe in slowly and as deeply as possible, raising the piston or the ball toward the top of the column. 6. Hold your breath for 3-5 seconds or for as long as possible. Allow the piston or ball to fall to the bottom of the column. 7. Remove the mouthpiece from your mouth and breathe out normally. 8. Rest for a few seconds and repeat Steps 1 through 7 at least 10 times every 1-2 hours when you are awake. Take your time and take a few normal breaths between deep breaths. 9. The spirometer may include an indicator to show your best effort. Use the indicator as a goal to work toward during each repetition. 10. After each set of 10 deep breaths, practice coughing to be sure your lungs are clear. If you have an incision (the cut made at the time of surgery), support your incision when coughing by placing a pillow or rolled up towels firmly  against it. Once you are able to get out of bed, walk around indoors and cough well. You may stop using the incentive spirometer when instructed by your caregiver.  RISKS AND COMPLICATIONS  Take your time so you do not  get dizzy or light-headed.  If you are in pain, you may need to take or ask for pain medication before doing incentive spirometry. It is harder to take a deep breath if you are having pain. AFTER USE  Rest and breathe slowly and easily.  It can be helpful to keep track of a log of your progress. Your caregiver can provide you with a simple table to help with this. If you are using the spirometer at home, follow these instructions: [place] IF:   You are having difficultly using the spirometer.  You have trouble using the spirometer as often as instructed.  Your pain medication is not giving enough relief while using the spirometer.  You develop fever of 100.5 F (38.1 C) or higher. SEEK IMMEDIATE MEDICAL CARE IF:   You cough up bloody sputum that had not been present before.  You develop fever of 102 F (38.9 C) or greater.  You develop worsening pain at or near the incision site. MAKE SURE YOU:   Understand these instructions.  Will watch your condition.  Will get help right away if you are not doing well or get worse. Document Released: 05/10/2006 Document Revised: 03/22/2011 Document Reviewed: 07/11/2006 [facility] Patient Information 2014 [place], [place].   ________________________________________________________________________

## 2018-12-20 LAB — HEMOGLOBIN A1C
Hgb A1c MFr Bld: 8.1 % — ABNORMAL HIGH (ref 4.8–5.6)
Mean Plasma Glucose: 186 mg/dL

## 2018-12-20 NOTE — Progress Notes (Signed)
HEMAGLOBIN A1 C RESULTS ROUTED TO DR SUPPLE AND JESSUCA ZELNACK SENT SECURE CHAT WITH RESULT

## 2018-12-20 NOTE — Progress Notes (Signed)
Anesthesia Chart Review   Case: [id number] Date/Time: 12/21/18 1205   Procedure: SHOULDER ARTHROSCOPY WITH SUBACROMIAL DECOMPRESSION AND DISTAL CLAVICLE EXCISION (Left ) -   Anesthesia type: General   Pre-op diagnosis: left shoulder impingement, acromioclavicular osteoarthritis   Location: [facility] ROOM 06 / [facility]   Surgeon: Francena Hanly, MD      DISCUSSION:48 y.o. current every day smoker (24 pack years) with h/o HLD, CAD (+stents, multiple Mis and multiple PCIs, ICD (generator change 05/19/2018, last check 11/28/2018), HTN, ischemic cardiomyopathy (EF 36% on Echo 11/06/12), migraines, left shoulder impingement, acromioclavicular OA scheduled for above procedure 12/21/2018 with Dr. Francena Hanly.   Pt cleared by cardiology 12/18/2018.  Per Corine Shelter, PA-C, "Chart reviewed and patient contacted by phone today as part of pre-operative protocol coverage. Given past medical history and time since last visit, based on ACC/AHA guidelines, Jonathan Fischer would be at acceptable risk for the planned procedure without further cardiovascular testing. OK to hold Plavix up to 5 days pre op if needed."  Anticipate pt can proceed with planned procedure barring acute status change.   VS: BP (!) 147/84   Pulse (!) 57   Temp 36.7 C (Oral)   Resp 18   Ht 6\' 1"  (1.854 m)   Wt 118.3 kg   SpO2 95%   BMI 34.41 kg/m   PROVIDERS: , MD is PCP   Pearson Grippe, Peter, MD is Cardiologist  LABS: A1C results forwarded to surgeon  (all labs ordered are listed, but only abnormal results are displayed)  Labs Reviewed  GLUCOSE, CAPILLARY - Abnormal; Notable for the following components:      Result Value   Glucose-Capillary 194 (*)    All other components within normal limits  HEMOGLOBIN A1C - Abnormal; Notable for the following components:   Hgb A1c MFr Bld 8.1 (*)    All other components within normal limits  BASIC METABOLIC PANEL - Abnormal; Notable for the following components:   Glucose, Bld  181 (*)    All other components within normal limits  CBC     IMAGES:   EKG: 03/08/2018 Rate 70 bpm  Normal Sinus rhythm  Possible inferior infarct, age undetermined Anterolateral infarct, age undetermined  CV: Cardiac Cath 03/14/2018   Dist LAD lesion is 85% stenosed.  Prox Cx lesion is 30% stenosed.  Balloon angioplasty was performed.  Previously placed Prox RCA to Mid RCA stent (unknown type) is widely patent.  Previously placed Mid RCA stent (unknown type) is widely patent.  Previously placed Prox Cx to Mid Cx stent (unknown type) is widely patent.  Previously placed Prox LAD to Mid LAD stent (unknown type) is widely patent.  There is severe left ventricular systolic dysfunction.  LV end diastolic pressure is normal.  The left ventricular ejection fraction is 25-35% by visual estimate.   1. Single vessel obstructive CAD involving the very distal LAD at the apex. All prior stents are widely patent.  2. Severe LV dysfunction. EF estimated at 25-30% . 3. Normal LVEDP  Plan: optimize CHF therapy. Will switch metoprolol to Coreg 12.5 mg bid. Further titration of meds as outpatient.   Echo 11/06/12 Myoview shows a large anterior scar without ischemia. EF 36%. Echo shows EF 30-35% with anterior akinesis. No clear trigger for recent VT. Continue medical Rx.  Past Medical History:  Diagnosis Date  . Abnormal echocardiogram    a. Possible small layer of mural apical thrombus without mobility by echo 12/2011, not felt to be candidate  for anticoagulation due to noncompliance.  [name] AICD (automatic cardioverter/defibrillator) present 11/2009  . Arthritis   . AUTOMATIC IMPLANTABLE CARDIAC DEFIBRILLATOR SITU 03/03/2010   Qualifier: Diagnosis of  By: Caryl Comes, MD, Leonidas Romberg Mack Guise   . CAD (coronary artery disease)    s/p prior LAD, LCx and RCA stenting remotely for an anterior MI, recurrent MI in 2010 with LAD stent occlusion s/p thrombectomy and PTCA, STEMI 12/2011 s/p  DES-prox LAD  . Cardiomyopathy, ischemic 12/14/2011  . Chronic systolic CHF (congestive heart failure) ([place])   . Headache    migraines  . HTN (hypertension)   . Hyperlipidemia   . ICD (implantable cardiac defibrillator) in place   . Ischemic cardiomyopathy    EF 25-30% s/p single-chamber Medtronic ICD   . NSVT (nonsustained ventricular tachycardia) ([place]) 12/14/2011  . Obesity   . STEMI (ST elevation myocardial infarction) ([place]) 12/12/2011  . Tobacco abuse   . TOBACCO USER 10/29/2009   Qualifier: Diagnosis of  By: Caryl Comes, MD, Remus Blake   . Tooth pain 12/14/2011  . VT (ventricular tachycardia) ([place]) 10/18/2012    Past Surgical History:  Procedure Laterality Date  . CARDIAC CATHETERIZATION  03/31/2009  . CARDIAC CATHETERIZATION  05/08/2008   CARDIAC SIZE AND SILHOUETTE NORMAL. THERE WAS ANTERIOR APICAL HYPOKINESIS WITH EF 35-40%  . CARDIAC CATHETERIZATION  03/14/2018  . CARDIAC DEFIBRILLATOR PLACEMENT  11/2009  . CORONARY ANGIOPLASTY  07/19/2016   BALLOON ANGIOPLASTY TO THE LAD WITH THROMBUS EXTRACTION OF THE PREVIOUSLY STENTED SEGMENT AND NEW STENT PLACEMENT TO THE LEFT CIRCUMFLEX. EF IS DOWN IN THE 30% RANGE  . CORONARY ANGIOPLASTY WITH STENT PLACEMENT  12/12/2011   30% mid LCx, mid RCA & mid LCx stents patent. LVEF 25-30%, mid-distal anterolateral wall AK, apex dilated, dyskinetic s/p DES-prox LAD stenosis.  . CORONARY BALLOON ANGIOPLASTY N/A 07/19/2016   Procedure: Coronary Balloon Angioplasty;  Surgeon: Martinique, Peter M, MD;  Location: [place] CV LAB;  Service: Cardiovascular;  Laterality: N/A;  . defib    . HAND SURGERY  1994   right hand  . ICD GENERATOR CHANGEOUT N/A 05/19/2018   Procedure: ICD GENERATOR CHANGEOUT;  Surgeon: Constance Haw, MD;  Location: [place] CV LAB;  Service: Cardiovascular;  Laterality: N/A;  . LEFT HEART CATH Bilateral 12/12/2011   Procedure: LEFT HEART CATH;  Surgeon: Peter M Martinique, MD;  Location: [facility] CATH LAB;  Service: Cardiovascular;   Laterality: Bilateral;  . LEFT HEART CATH AND CORONARY ANGIOGRAPHY N/A 07/19/2016   Procedure: Left Heart Cath and Coronary Angiography;  Surgeon: Martinique, Peter M, MD;  Location: [place] CV LAB;  Service: Cardiovascular;  Laterality: N/A;  . LEFT HEART CATH AND CORONARY ANGIOGRAPHY N/A 03/14/2018   Procedure: LEFT HEART CATH AND CORONARY ANGIOGRAPHY;  Surgeon: Martinique, Peter M, MD;  Location: [place] CV LAB;  Service: Cardiovascular;  Laterality: N/A;    MEDICATIONS: . aspirin EC 81 MG EC tablet  . atorvastatin (LIPITOR) 80 MG tablet  . carvedilol (COREG) 12.5 MG tablet  . cephALEXin (KEFLEX) 500 MG capsule  . clopidogrel (PLAVIX) 75 MG tablet  . glipiZIDE (GLUCOTROL XL) 2.5 MG 24 hr tablet  . ibuprofen (ADVIL) 200 MG tablet  . lisinopril (ZESTRIL) 10 MG tablet  . nitroGLYCERIN (NITROSTAT) 0.4 MG SL tablet  . oxymetazoline (AFRIN) 0.05 % nasal spray  . pantoprazole (PROTONIX) 40 MG tablet   No current facility-administered medications for this encounter.     Maia Plan [facility] Pre-Surgical Testing [phone] 12/20/18  12:11 PM

## 2018-12-20 NOTE — Anesthesia Preprocedure Evaluation (Addendum)
Anesthesia Evaluation  Patient identified by MRN, date of birth, ID band Patient awake    Reviewed: Allergy & Precautions, NPO status , Patient's Chart, lab work & pertinent test results, reviewed documented beta blocker date and time   Airway Mallampati: I  TM Distance: >3 FB Neck ROM: Full    Dental  (+) Poor Dentition, Chipped,    Pulmonary neg pulmonary ROS, Current Smoker,    Pulmonary exam normal breath sounds clear to auscultation       Cardiovascular hypertension, Pt. on home beta blockers and Pt. on medications + angina + CAD, + Past MI, + Cardiac Stents (s/p prior LAD, LCx and RCA stenting remotely for an anterior MI, recurrent MI in 2010 with LAD stent occlusion s/p thrombectomy and PTCA, STEMI 12/2011 s/p DES-prox LAD) and +CHF  Normal cardiovascular exam+ Cardiac Defibrillator  Rhythm:Regular Rate:Normal  Cardiac Cath 03/14/2018 Dist LAD lesion is 85% stenosed. Prox Cx lesion is 30% stenosed. Balloon angioplasty was performed. Previously placed Prox RCA to Mid RCA stent (unknown type) is widely patent. Previously placed Mid RCA stent (unknown type) is widely patent. Previously placed Prox Cx to Mid Cx stent (unknown type) is widely patent. Previously placed Prox LAD to Mid LAD stent (unknown type) is widely patent. There is severe left ventricular systolic dysfunction. LV end diastolic pressure is normal. The left ventricular ejection fraction is 25-35% by visual estimate. 1. Single vessel obstructive CAD involving the very distal LAD at the apex. All prior stents are widely patent.  2. Severe LV dysfunction. EF estimated at 25-30% . 3. Normal LVEDP Plan: optimize CHF therapy. Will switch metoprolol to Coreg 12.5 mg bid. Further titration of meds as outpatient.   Echo 11/06/12 Myoview shows a large anterior scar without ischemia. EF 36%. Echo shows EF 30-35% with anterior akinesis. No clear trigger for  recent VT. Continue medical Rx.    Neuro/Psych negative neurological ROS  negative psych ROS   GI/Hepatic negative GI ROS, Neg liver ROS,   Endo/Other  negative endocrine ROS  Renal/GU negative Renal ROS  negative genitourinary   Musculoskeletal  (+) Arthritis ,   Abdominal   Peds  Hematology negative hematology ROS (+)   Anesthesia Other Findings On plavix  Reproductive/Obstetrics                           Anesthesia Physical Anesthesia Plan  ASA: IV  Anesthesia Plan: General and Regional   Post-op Pain Management:  Regional for Post-op pain   Induction: Intravenous  PONV Risk Score and Plan: 1 and Midazolam, Dexamethasone and Ondansetron  Airway Management Planned: Oral ETT  Additional Equipment:   Intra-op Plan:   Post-operative Plan: Extubation in OR  Informed Consent: I have reviewed the patients History and Physical, chart, labs and discussed the procedure including the risks, benefits and alternatives for the proposed anesthesia with the patient or authorized representative who has indicated his/her understanding and acceptance.     Dental advisory given  Plan Discussed with: CRNA  Anesthesia Plan Comments:        Anesthesia Quick Evaluation

## 2018-12-21 ENCOUNTER — Other Ambulatory Visit: Payer: Self-pay

## 2018-12-21 ENCOUNTER — Telehealth (HOSPITAL_COMMUNITY): Payer: Self-pay | Admitting: *Deleted

## 2018-12-21 ENCOUNTER — Encounter (HOSPITAL_COMMUNITY): Payer: Self-pay | Admitting: Orthopedic Surgery

## 2018-12-21 ENCOUNTER — Ambulatory Visit (HOSPITAL_COMMUNITY): Payer: Medicare HMO | Admitting: Anesthesiology

## 2018-12-21 ENCOUNTER — Ambulatory Visit (HOSPITAL_COMMUNITY)
Admission: RE | Admit: 2018-12-21 | Discharge: 2018-12-21 | Disposition: A | Discharge status: Home / Self Care | Payer: Medicare HMO | Attending: Orthopedic Surgery | Admitting: Orthopedic Surgery

## 2018-12-21 ENCOUNTER — Ambulatory Visit (HOSPITAL_COMMUNITY): Payer: Medicare HMO | Admitting: Physician Assistant

## 2018-12-21 ENCOUNTER — Encounter (HOSPITAL_COMMUNITY)
Admission: RE | Disposition: A | Discharge status: Home / Self Care | Payer: Self-pay | Source: Home / Self Care | Attending: Orthopedic Surgery

## 2018-12-21 DIAGNOSIS — G8929 Other chronic pain: Secondary | ICD-10-CM | POA: Diagnosis not present

## 2018-12-21 DIAGNOSIS — M7542 Impingement syndrome of left shoulder: Secondary | ICD-10-CM | POA: Diagnosis not present

## 2018-12-21 DIAGNOSIS — I2510 Atherosclerotic heart disease of native coronary artery without angina pectoris: Secondary | ICD-10-CM | POA: Insufficient documentation

## 2018-12-21 DIAGNOSIS — Z8249 Family history of ischemic heart disease and other diseases of the circulatory system: Secondary | ICD-10-CM | POA: Diagnosis not present

## 2018-12-21 DIAGNOSIS — M75112 Incomplete rotator cuff tear or rupture of left shoulder, not specified as traumatic: Secondary | ICD-10-CM | POA: Insufficient documentation

## 2018-12-21 DIAGNOSIS — Z7984 Long term (current) use of oral hypoglycemic drugs: Secondary | ICD-10-CM | POA: Diagnosis not present

## 2018-12-21 DIAGNOSIS — Z79899 Other long term (current) drug therapy: Secondary | ICD-10-CM | POA: Diagnosis not present

## 2018-12-21 DIAGNOSIS — Z6834 Body mass index (BMI) 34.0-34.9, adult: Secondary | ICD-10-CM | POA: Insufficient documentation

## 2018-12-21 DIAGNOSIS — X58XXXA Exposure to other specified factors, initial encounter: Secondary | ICD-10-CM | POA: Diagnosis not present

## 2018-12-21 DIAGNOSIS — I252 Old myocardial infarction: Secondary | ICD-10-CM | POA: Diagnosis not present

## 2018-12-21 DIAGNOSIS — Z955 Presence of coronary angioplasty implant and graft: Secondary | ICD-10-CM | POA: Insufficient documentation

## 2018-12-21 DIAGNOSIS — I739 Peripheral vascular disease, unspecified: Secondary | ICD-10-CM | POA: Diagnosis not present

## 2018-12-21 DIAGNOSIS — F17290 Nicotine dependence, other tobacco product, uncomplicated: Secondary | ICD-10-CM | POA: Diagnosis not present

## 2018-12-21 DIAGNOSIS — Z7982 Long term (current) use of aspirin: Secondary | ICD-10-CM | POA: Insufficient documentation

## 2018-12-21 DIAGNOSIS — Z7902 Long term (current) use of antithrombotics/antiplatelets: Secondary | ICD-10-CM | POA: Insufficient documentation

## 2018-12-21 DIAGNOSIS — E669 Obesity, unspecified: Secondary | ICD-10-CM | POA: Diagnosis not present

## 2018-12-21 DIAGNOSIS — G8918 Other acute postprocedural pain: Secondary | ICD-10-CM | POA: Diagnosis not present

## 2018-12-21 DIAGNOSIS — Z95810 Presence of automatic (implantable) cardiac defibrillator: Secondary | ICD-10-CM | POA: Insufficient documentation

## 2018-12-21 DIAGNOSIS — I110 Hypertensive heart disease with heart failure: Secondary | ICD-10-CM | POA: Insufficient documentation

## 2018-12-21 DIAGNOSIS — I5022 Chronic systolic (congestive) heart failure: Secondary | ICD-10-CM | POA: Diagnosis not present

## 2018-12-21 DIAGNOSIS — M19012 Primary osteoarthritis, left shoulder: Secondary | ICD-10-CM | POA: Insufficient documentation

## 2018-12-21 DIAGNOSIS — S43432A Superior glenoid labrum lesion of left shoulder, initial encounter: Secondary | ICD-10-CM | POA: Diagnosis not present

## 2018-12-21 DIAGNOSIS — Z4889 Encounter for other specified surgical aftercare: Secondary | ICD-10-CM | POA: Diagnosis not present

## 2018-12-21 DIAGNOSIS — I255 Ischemic cardiomyopathy: Secondary | ICD-10-CM | POA: Insufficient documentation

## 2018-12-21 DIAGNOSIS — E785 Hyperlipidemia, unspecified: Secondary | ICD-10-CM | POA: Diagnosis not present

## 2018-12-21 DIAGNOSIS — M25812 Other specified joint disorders, left shoulder: Secondary | ICD-10-CM | POA: Insufficient documentation

## 2018-12-21 DIAGNOSIS — M25512 Pain in left shoulder: Secondary | ICD-10-CM | POA: Diagnosis not present

## 2018-12-21 LAB — SURGICAL PCR SCREEN
MRSA, PCR: NEGATIVE
Staphylococcus aureus: NEGATIVE

## 2018-12-21 LAB — GLUCOSE, CAPILLARY: Glucose-Capillary: 181 mg/dL — ABNORMAL HIGH (ref 70–99)

## 2018-12-21 SURGERY — SHOULDER ARTHROSCOPY WITH SUBACROMIAL DECOMPRESSION AND DISTAL CLAVICLE EXCISION
Anesthesia: Regional | Site: Shoulder | Laterality: Left

## 2018-12-21 MED ORDER — BUPIVACAINE LIPOSOME 1.3 % IJ SUSP
INTRAMUSCULAR | Status: DC | PRN
  Administered 2018-12-21: 10 mL via PERINEURAL

## 2018-12-21 MED ORDER — PHENYLEPHRINE 40 MCG/ML (10ML) SYRINGE FOR IV PUSH (FOR BLOOD PRESSURE SUPPORT)
PREFILLED_SYRINGE | INTRAVENOUS | Status: DC | PRN
  Administered 2018-12-21 (×2): 80 ug via INTRAVENOUS

## 2018-12-21 MED ORDER — LIDOCAINE 2% (20 MG/ML) 5 ML SYRINGE
INTRAMUSCULAR | Status: DC | PRN
  Administered 2018-12-21: 40 mg via INTRAVENOUS

## 2018-12-21 MED ORDER — ROCURONIUM BROMIDE 10 MG/ML (PF) SYRINGE
PREFILLED_SYRINGE | INTRAVENOUS | Status: DC | PRN
  Administered 2018-12-21: 10 mg via INTRAVENOUS

## 2018-12-21 MED ORDER — ACETAMINOPHEN 500 MG PO TABS
1000.0000 mg | ORAL_TABLET | Freq: Once | ORAL | Status: AC
  Administered 2018-12-21: 1000 mg via ORAL
  Filled 2018-12-21: qty 2

## 2018-12-21 MED ORDER — SUCCINYLCHOLINE CHLORIDE 20 MG/ML IJ SOLN
INTRAMUSCULAR | Status: DC | PRN
  Administered 2018-12-21: 180 mg via INTRAVENOUS

## 2018-12-21 MED ORDER — SODIUM CHLORIDE 0.9 % IR SOLN
Status: DC | PRN
  Administered 2018-12-21 (×2): 3000 mL

## 2018-12-21 MED ORDER — DEXAMETHASONE SODIUM PHOSPHATE 10 MG/ML IJ SOLN
INTRAMUSCULAR | Status: DC | PRN
  Administered 2018-12-21: 10 mg via INTRAVENOUS

## 2018-12-21 MED ORDER — MIDAZOLAM HCL 2 MG/2ML IJ SOLN
1.0000 mg | Freq: Once | INTRAMUSCULAR | Status: AC
  Administered 2018-12-21: 2 mg via INTRAVENOUS
  Filled 2018-12-21: qty 2

## 2018-12-21 MED ORDER — FENTANYL CITRATE (PF) 250 MCG/5ML IJ SOLN
INTRAMUSCULAR | Status: AC
  Filled 2018-12-21: qty 5

## 2018-12-21 MED ORDER — MIDAZOLAM HCL 5 MG/5ML IJ SOLN
INTRAMUSCULAR | Status: DC | PRN
  Administered 2018-12-21: 2 mg via INTRAVENOUS

## 2018-12-21 MED ORDER — PHENYLEPHRINE HCL-NACL 10-0.9 MG/250ML-% IV SOLN
INTRAVENOUS | Status: DC | PRN
  Administered 2018-12-21: 30 ug/min via INTRAVENOUS

## 2018-12-21 MED ORDER — CARVEDILOL 12.5 MG PO TABS
12.5000 mg | ORAL_TABLET | ORAL | Status: AC
  Administered 2018-12-21: 12.5 mg via ORAL
  Filled 2018-12-21: qty 1

## 2018-12-21 MED ORDER — ONDANSETRON HCL 4 MG PO TABS: 4.0000 mg | ORAL_TABLET | Freq: Three times a day (TID) | ORAL | 0 refills | Status: DC | PRN

## 2018-12-21 MED ORDER — CHLORHEXIDINE GLUCONATE 4 % EX LIQD
60.0000 mL | Freq: Once | CUTANEOUS | Status: AC
  Administered 2018-12-21: 4 via TOPICAL

## 2018-12-21 MED ORDER — SUCCINYLCHOLINE CHLORIDE 200 MG/10ML IV SOSY
PREFILLED_SYRINGE | INTRAVENOUS | Status: AC
  Filled 2018-12-21: qty 10

## 2018-12-21 MED ORDER — FENTANYL CITRATE (PF) 100 MCG/2ML IJ SOLN
INTRAMUSCULAR | Status: DC | PRN
  Administered 2018-12-21: 100 ug via INTRAVENOUS

## 2018-12-21 MED ORDER — LACTATED RINGERS IV SOLN
INTRAVENOUS | Status: DC
  Administered 2018-12-21: 11:00:00 via INTRAVENOUS

## 2018-12-21 MED ORDER — MIDAZOLAM HCL 2 MG/2ML IJ SOLN
INTRAMUSCULAR | Status: AC
  Filled 2018-12-21: qty 2

## 2018-12-21 MED ORDER — BUPIVACAINE HCL (PF) 0.5 % IJ SOLN
INTRAMUSCULAR | Status: DC | PRN
  Administered 2018-12-21: 15 mL via PERINEURAL

## 2018-12-21 MED ORDER — CYCLOBENZAPRINE HCL 10 MG PO TABS: 10.0000 mg | ORAL_TABLET | Freq: Three times a day (TID) | ORAL | 1 refills | Status: DC | PRN

## 2018-12-21 MED ORDER — ALBUTEROL SULFATE HFA 108 (90 BASE) MCG/ACT IN AERS
INHALATION_SPRAY | RESPIRATORY_TRACT | Status: DC | PRN
  Administered 2018-12-21: 2 via RESPIRATORY_TRACT

## 2018-12-21 MED ORDER — ONDANSETRON HCL 4 MG/2ML IJ SOLN
INTRAMUSCULAR | Status: DC | PRN
  Administered 2018-12-21: 4 mg via INTRAVENOUS

## 2018-12-21 MED ORDER — CEFAZOLIN SODIUM-DEXTROSE 2-4 GM/100ML-% IV SOLN
2.0000 g | INTRAVENOUS | Status: AC
  Administered 2018-12-21: 2 g via INTRAVENOUS
  Filled 2018-12-21: qty 100

## 2018-12-21 MED ORDER — PHENYLEPHRINE 40 MCG/ML (10ML) SYRINGE FOR IV PUSH (FOR BLOOD PRESSURE SUPPORT)
PREFILLED_SYRINGE | INTRAVENOUS | Status: AC
  Filled 2018-12-21: qty 10

## 2018-12-21 MED ORDER — FENTANYL CITRATE (PF) 100 MCG/2ML IJ SOLN: 25.0000 ug | INTRAMUSCULAR | Status: DC | PRN

## 2018-12-21 MED ORDER — ROCURONIUM BROMIDE 10 MG/ML (PF) SYRINGE
PREFILLED_SYRINGE | INTRAVENOUS | Status: AC
  Filled 2018-12-21: qty 10

## 2018-12-21 MED ORDER — PROPOFOL 10 MG/ML IV BOLUS
INTRAVENOUS | Status: DC | PRN
  Administered 2018-12-21: 150 mg via INTRAVENOUS

## 2018-12-21 MED ORDER — ALBUTEROL SULFATE HFA 108 (90 BASE) MCG/ACT IN AERS
INHALATION_SPRAY | RESPIRATORY_TRACT | Status: AC
  Filled 2018-12-21: qty 6.7

## 2018-12-21 MED ORDER — ONDANSETRON HCL 4 MG/2ML IJ SOLN
INTRAMUSCULAR | Status: AC
  Filled 2018-12-21: qty 2

## 2018-12-21 MED ORDER — OXYCODONE-ACETAMINOPHEN 5-325 MG PO TABS: 1.0000 | ORAL_TABLET | ORAL | 0 refills | Status: DC | PRN

## 2018-12-21 MED ORDER — FENTANYL CITRATE (PF) 100 MCG/2ML IJ SOLN
50.0000 ug | Freq: Once | INTRAMUSCULAR | Status: AC
  Administered 2018-12-21: 50 ug via INTRAVENOUS
  Filled 2018-12-21: qty 2

## 2018-12-21 MED ORDER — PROPOFOL 10 MG/ML IV BOLUS
INTRAVENOUS | Status: AC
  Filled 2018-12-21: qty 20

## 2018-12-21 SURGICAL SUPPLY — 63 items
BLADE EXCALIBUR 4.0MM X 13CM (MISCELLANEOUS) ×1
BLADE EXCALIBUR 4.0X13 (MISCELLANEOUS) ×2 IMPLANT
BOOTIES KNEE HIGH SLOAN (MISCELLANEOUS) ×6 IMPLANT
BURR OVAL 8 FLU 4.0MM X 13CM (MISCELLANEOUS) ×1
BURR OVAL 8 FLU 4.0X13 (MISCELLANEOUS) ×2 IMPLANT
CANNULA ACUFLEX KIT 5X76 (CANNULA) ×3 IMPLANT
CANNULA DRILOCK 5.0MMX75MM (CANNULA)
CANNULA DRILOCK 5.0X75 (CANNULA) IMPLANT
CANNULA TWIST IN 8.25X7CM (CANNULA) IMPLANT
CLOSURE STERI-STRIP 1/2X4 (GAUZE/BANDAGES/DRESSINGS) ×1
CLOSURE WOUND 1/2 X4 (GAUZE/BANDAGES/DRESSINGS) ×1
CLSR STERI-STRIP ANTIMIC 1/2X4 (GAUZE/BANDAGES/DRESSINGS) ×1 IMPLANT
CONNECTOR 5 IN 1 STRAIGHT STRL (MISCELLANEOUS) ×3 IMPLANT
COOLER ICEMAN CLASSIC (MISCELLANEOUS) IMPLANT
COVER WAND RF STERILE (DRAPES) ×1 IMPLANT
DISSECTOR  3.8MM X 13CM (MISCELLANEOUS) ×2
DISSECTOR 3.8MM X 13CM (MISCELLANEOUS) ×1 IMPLANT
DRAPE INCISE 23X17 IOBAN STRL (DRAPES) ×2
DRAPE INCISE 23X17 STRL (DRAPES) ×1 IMPLANT
DRAPE INCISE IOBAN 23X17 STRL (DRAPES) ×1 IMPLANT
DRAPE INCISE IOBAN 66X45 STRL (DRAPES) ×5 IMPLANT
DRAPE ORTHO SPLIT 77X108 STRL (DRAPES) ×6
DRAPE STERI 35X30 U-POUCH (DRAPES) ×3 IMPLANT
DRAPE SURG 17X11 SM STRL (DRAPES) ×3 IMPLANT
DRAPE SURG ORHT 6 SPLT 77X108 (DRAPES) ×2 IMPLANT
DRAPE U-SHAPE 47X51 STRL (DRAPES) ×3 IMPLANT
DRSG PAD ABDOMINAL 8X10 ST (GAUZE/BANDAGES/DRESSINGS) ×8 IMPLANT
DURAPREP 26ML APPLICATOR (WOUND CARE) ×6 IMPLANT
GAUZE SPONGE 4X4 12PLY STRL (GAUZE/BANDAGES/DRESSINGS) ×3 IMPLANT
GLOVE BIO SURGEON STRL SZ7.5 (GLOVE) ×3 IMPLANT
GLOVE BIO SURGEON STRL SZ8 (GLOVE) ×3 IMPLANT
GLOVE SS BIOGEL STRL SZ 7 (GLOVE) ×2 IMPLANT
GLOVE SS BIOGEL STRL SZ 7.5 (GLOVE) ×1 IMPLANT
GLOVE SUPERSENSE BIOGEL SZ 7 (GLOVE) ×4
GLOVE SUPERSENSE BIOGEL SZ 7.5 (GLOVE) ×2
GOWN STRL REUS W/TWL LRG LVL3 (GOWN DISPOSABLE) ×6 IMPLANT
KIT BASIN OR (CUSTOM PROCEDURE TRAY) ×3 IMPLANT
KIT SHOULDER TRACTION (DRAPES) ×3 IMPLANT
KIT TURNOVER KIT A (KITS) IMPLANT
MANIFOLD NEPTUNE II (INSTRUMENTS) ×3 IMPLANT
NDL SCORPION MULTI FIRE (NEEDLE) IMPLANT
NEEDLE SCORPION MULTI FIRE (NEEDLE) IMPLANT
NS IRRIG 1000ML POUR BTL (IV SOLUTION) ×3 IMPLANT
PACK ARTHROSCOPY DSU (CUSTOM PROCEDURE TRAY) ×3 IMPLANT
PAD ARMBOARD 7.5X6 YLW CONV (MISCELLANEOUS) ×3 IMPLANT
PAD COLD SHLDR WRAP-ON (PAD) IMPLANT
PENCIL SMOKE EVACUATOR (MISCELLANEOUS) IMPLANT
PROBE APOLLO 90XL (SURGICAL WAND) ×3 IMPLANT
SLING ARM FOAM STRAP MED (SOFTGOODS) IMPLANT
SLING ARM IMMOBILIZER LRG (SOFTGOODS) ×2 IMPLANT
SPONGE LAP 4X18 RFD (DISPOSABLE) ×3 IMPLANT
STRIP CLOSURE SKIN 1/2X4 (GAUZE/BANDAGES/DRESSINGS) ×2 IMPLANT
SUT FIBERWIRE #2 38 T-5 BLUE (SUTURE)
SUT MNCRL AB 3-0 PS2 18 (SUTURE) ×3 IMPLANT
SUT PDS AB 0 CT 36 (SUTURE) IMPLANT
SUT TIGER TAPE 7 IN WHITE (SUTURE) ×3 IMPLANT
SUTURE FIBERWR #2 38 T-5 BLUE (SUTURE) IMPLANT
TAPE FIBER 2MM 7IN #2 BLUE (SUTURE) IMPLANT
TAPE PAPER 3X10 WHT MICROPORE (GAUZE/BANDAGES/DRESSINGS) ×3 IMPLANT
TAPE STRIPS DRAPE STRL (GAUZE/BANDAGES/DRESSINGS) ×2 IMPLANT
TOWEL OR 17X26 10 PK STRL BLUE (TOWEL DISPOSABLE) ×3 IMPLANT
TUBING ARTHROSCOPY IRRIG 16FT (MISCELLANEOUS) ×3 IMPLANT
WATER STERILE IRR 1000ML POUR (IV SOLUTION) ×3 IMPLANT

## 2018-12-21 NOTE — Discharge Instructions (Signed)
   Metta Clines. Supple, M.D., F.A.A.O.S. Orthopaedic Surgery Specializing in Arthroscopic and Reconstructive Surgery of the Shoulder [phone] [address] [place], [place] [address] - Fax [phone]   POST-OP SHOULDER ARTHROSCOPY INSTRUCTIONS  1. Call the office at [phone] to schedule your first post-op appointment 7-10 days from the date of your surgery.  2. Leave the steri-strips in place over your incisions when performing dressing changes and showering. You may remove your dressings and begin showering 72 hours from surgery. You can expect drainage that is clear to bloody in nature that occasionally will soak through your dressings. If this occurs go ahead and perform a dressing change. The drainage should lessen daily and when there is no drainage from your incisions feel free to go without a dressing.  3. Wear your sling for comfort. You may come out of your sling for ad lib activity and even decide not to use the sling at all. If you find you are more comfortable in your sling, make sure you come out of your sling at least 3-4 times a day to do the exercises that are included below.  4. Range of motion to your elbow, wrist, and hand are encouraged 3-5 times daily. Exercise to your hand and fingers helps to reduce swelling you may experience.  5. Use the ice machine as instructed  6. You may drive when safely off narcotics and muscle relaxants.  7.Pain control following an exparel block  To help control your post-operative pain you received a nerve block  performed with Exparel which is a long acting anesthetic (numbing agent) which can provide pain relief and sensations of numbness (and relief of pain) in the operative shoulder and arm for up to 3 days. Sometimes it provides mixed relief, meaning you may still have numbness in certain areas of the arm but can still be able to move  parts of that arm, hand, and fingers. We recommend that your prescribed pain  medications  be used as needed. We do not feel it is necessary to "pre medicate" and "stay ahead" of pain.  Taking narcotic pain medications when you are not having any pain can lead to unnecessary and potentially dangerous side effects.    8. Pain medications can produce constipation along with their use. If you experience this, the use of an over the counter stool softener or laxative daily is recommended.   9. For additional questions or concerns, please do not hesitate to call the office. If after hours there is an answering service to forward your concerns to the physician on call.   POST-OP EXERCISES  The pendulum exercises should be performed while bending at the waist as far over as possible thereby letting gravity do the work for you.  Range of Motion Exercises: Pendulum (circular)  Repeat 20 times. Do 3 sessions per day.     Range of Motion Exercises: Pendulum (side-to-side)  Repeat 20 times. Do 3 sessions per day.    Range of Motion Exercises (self-stretching activities):  Slide arm up wall with palm toward you, moving closer to the wall. Hold for 5 seconds.  Repeat 10 times. Do 3 sessions per day.

## 2018-12-21 NOTE — Anesthesia Procedure Notes (Signed)
Anesthesia Regional Block: Interscalene brachial plexus block   Pre-Anesthetic Checklist: ,, timeout performed, Correct Patient, Correct Site, Correct Laterality, Correct Procedure, Correct Position, site marked, Risks and benefits discussed,  Surgical consent,  Pre-op evaluation,  At surgeon's request and post-op pain management  Laterality: Left  Prep: Maximum Sterile Barrier Precautions used, chloraprep       Needles:  Injection technique: Single-shot  Needle Type: Echogenic Stimulator Needle     Needle Length: 4cm  Needle Gauge: 22     Additional Needles:   Procedures:,,,, ultrasound used (permanent image in chart),,,,  Narrative:  Start time: 12/21/2018 11:26 AM End time: 12/21/2018 11:36 AM Injection made incrementally with aspirations every 5 mL.  Performed by: Personally  Anesthesiologist: Freddrick March, MD  Additional Notes: Monitors applied. No increased pain on injection. No increased resistance to injection. Injection made in 5cc increments. Good needle visualization. Patient tolerated procedure well.

## 2018-12-21 NOTE — Op Note (Signed)
12/21/2018  1:48 PM  PATIENT:   Jonathan Fischer  48 y.o. male  PRE-OPERATIVE DIAGNOSIS:  left shoulder impingement, acromioclavicular osteoarthritis  POST-OPERATIVE DIAGNOSIS: Same with small degenerative superior labral tear and partial articular rotator cuff tear  PROCEDURE:  1.  Left shoulder glenohumeral joint diagnostic arthroscopy  2.  Debridement of degenerative superior labral tear and partial articular rotator cuff tear  3.  Arthroscopic subacromial decompression and bursectomy  4.  Arthroscopic distal clavicle resection.  SURGEON:  Senaida Lange M.D.  ASSISTANTS: Ralene Bathe, PA-C  ANESTHESIA:   General endotracheal and interscalene block with Exparel  EBL: Minimal  SPECIMEN: None  Drains: None   PATIENT DISPOSITION:  PACU - hemodynamically stable.    PLAN OF CARE: Discharge to home after PACU  Brief history:  Jonathan Fischer is a 48 year old gentleman with a past medical history significant for coronary artery disease with cardiac dysfunction and chronic utilization of a pacemaker who had a recent pacemaker exchange and post procedure noted the onset of left shoulder pain.  He has had continued difficulties despite prolonged attempts at conservative management.  His MRI scan shows evidence for advanced AC joint arthritis with impingement syndrome.  No obvious discrete full-thickness rotator cuff tear.  Due to his ongoing pain and functional imitations he is brought to the operating room at this time for planned left shoulder arthroscopy as described below  Preoperatively I counseled Jonathan Fischer regarding treatment options as well as the potential risks versus benefits thereof.  Possible surgical complications were reviewed including bleeding, infection, neurovascular injury, persistent pain, loss of motion, anesthetic complication, and possible need for additional surgery.  He understands, and accepts, and agrees with our planned procedure.  Procedure in  detail:  After undergoing routine preop evaluation patient received prophylactic antibiotics and interscalene block with Exparel was established in the holding area by the anesthesia department.  Patient was placed supine on the operating table and underwent the smooth induction of a general endotracheal anesthesia.  Turned to the right lateral decubitus position on a beanbag and appropriately padded and protected.  Left arm was then suspended at 70 degrees of abduction with 15 pounds of traction in the left shoulder girdle region was sterilely prepped and draped in standard fashion.  Timeout was called.  A posterior portal established into the glenohumeral joint and an anterior portal established under direct visualization.  The articular surfaces were all in excellent condition.  Capsular volume was within normal limits.  There was degenerative tearing of the anterior and superior labrum which we debrided with a shaver to a stable margin.  No instability patterns were noted.  The biceps anchor was stable and the biceps tendon was of normal caliber with no evidence for proximal or distal instability.  There was some partial articular sided tearing of the most anterior distal aspect of the supraspinatus which we debrided with a shaver but this was felt to be well less than 5% of the overall thickness of the tendon.  The balance of the examination within the glenohumeral joint showed no additional pathologies.  Fluid and instruments were then removed.  The arm was dropped down to 30 degrees of abduction and the arthroscope was introduced in the subacromial space of the posterior portal and a direct lateral portal established into the subacromial space.  Abundant dense bursal tissue and multiple adhesions were encountered and these were all divided and excised with the combination of a shaver and Stryker wand.  The wand was then  used to remove the periosteum from the undersurface of the anterior half of the acromion  and then a subacromial decompression was performed with a bur creating a type I morphology.  A portal was then established directly anterior to the distal clavicle and a distal clavicle resection was performed with a bur.  Care was taken to confirm visualization of the entire circumference of the distal clavicle to ensure adequate removal of bone.  We then completed the subacromial/subdeltoid bursectomy.  Carefully inspected the bursal surface of the rotator cuff was found to be intact.  Final hemostasis was obtained.  Fluid and instruments were removed.  The portals were closed with a Monocryl and Steri-Strip.  A dry dressing taped about the left shoulder and left arm was placed into a sling and the patient was awakened, extubated, and taken to the recovery room in stable addition.  Jonathan Loges, PA-C was utilized throughout this case as an Environmental consultant critical for help with patient positioning manipulation of the extremity tissue manipulation equipment management wound closure and intraoperative decision making.  Jonathan Clines Ashana Tullo MD   Contact # [phone]

## 2018-12-21 NOTE — Transfer of Care (Signed)
Immediate Anesthesia Transfer of Care Note  Patient: Jonathan Fischer  Procedure(s) Performed: SHOULDER ARTHROSCOPY WITH SUBACROMIAL DECOMPRESSION AND DISTAL CLAVICLE EXCISION (Left Shoulder)  Patient Location: PACU  Anesthesia Type:General  Level of Consciousness: sedated, patient cooperative and responds to stimulation  Airway & Oxygen Therapy: Patient Spontanous Breathing and Patient connected to face mask oxygen  Post-op Assessment: Report given to RN and Post -op Vital signs reviewed and stable  Post vital signs: Reviewed and stable  Last Vitals:  Vitals Value Taken Time  BP 151/80 12/21/18 1401  Temp    Pulse 61 12/21/18 1404  Resp 18 12/21/18 1404  SpO2 93 % 12/21/18 1404  Vitals shown include unvalidated device data.  Last Pain:  Vitals:   12/21/18 1054  TempSrc:   PainSc: 4       Patients Stated Pain Goal: 4 (37/16/96 7893)  Complications: No apparent anesthesia complications

## 2018-12-21 NOTE — Anesthesia Postprocedure Evaluation (Signed)
Anesthesia Post Note  Patient: Jonathan Fischer  Procedure(s) Performed: SHOULDER ARTHROSCOPY WITH SUBACROMIAL DECOMPRESSION AND DISTAL CLAVICLE EXCISION (Left Shoulder)     Patient location during evaluation: PACU Anesthesia Type: Regional and General Level of consciousness: awake and alert Pain management: pain level controlled Vital Signs Assessment: post-procedure vital signs reviewed and stable Respiratory status: spontaneous breathing, nonlabored ventilation, respiratory function stable and patient connected to nasal cannula oxygen Cardiovascular status: blood pressure returned to baseline and stable Postop Assessment: no apparent nausea or vomiting Anesthetic complications: no    Last Vitals:  Vitals:   12/21/18 1500 12/21/18 1515  BP: 119/73 123/83  Pulse: 60 70  Resp: 17 19  Temp:    SpO2: 92% 94%    Last Pain:  Vitals:   12/21/18 1515  TempSrc:   PainSc: 0-No pain                 Laurene Melendrez L Graham Hyun

## 2018-12-21 NOTE — Progress Notes (Signed)
AssistedDr. Chelsey Woodrum with left, ultrasound guided, interscalene  block. Side rails up, monitors on throughout procedure. See vital signs in flow sheet. Tolerated Procedure well.  

## 2018-12-21 NOTE — Anesthesia Procedure Notes (Signed)
Procedure Name: Intubation Performed by: Jessyka Austria J, CRNA Pre-anesthesia Checklist: Patient identified, Emergency Drugs available, Suction available, Patient being monitored and Timeout performed Patient Re-evaluated:Patient Re-evaluated prior to induction Oxygen Delivery Method: Circle system utilized Preoxygenation: Pre-oxygenation with 100% oxygen Induction Type: IV induction Ventilation: Mask ventilation without difficulty Laryngoscope Size: Mac and 4 Grade View: Grade I Tube size: 7.5 mm Number of attempts: 1 Airway Equipment and Method: Stylet Placement Confirmation: ETT inserted through vocal cords under direct vision,  positive ETCO2 and breath sounds checked- equal and bilateral Secured at: 23 cm Tube secured with: Tape Dental Injury: Teeth and Oropharynx as per pre-operative assessment        

## 2018-12-21 NOTE — Progress Notes (Signed)
Pt upset upon discharge. [employer] Representative not available at the time of his discharge. Pt mentioned he will send a report from home. Pt discharged in NAD, VSS, no pain or discomfort. Pt discharged by wheelchair home with wife.

## 2018-12-21 NOTE — H&P (Signed)
Jonathan Fischer    Chief Complaint: left shoulder impingement, acromioclavicular osteoarthritis HPI: The patient is a 48 y.o. male with chronic left shoulder pain that has been refractory to prolonged attempts at conservative management.  Preoperative MRI scan confirms advanced AC joint arthritis and associated severe bony impingement.  He is brought to the operating this time for planned left shoulder arthroscopy with subacromial decompression and distal clavicle resection and treatment of any additional pathologies identified at the time of surgery.  Past Medical History:  Diagnosis Date  . Abnormal echocardiogram    a. 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  [name] AICD (automatic cardioverter/defibrillator) present 11/2009  . Arthritis   . AUTOMATIC IMPLANTABLE CARDIAC DEFIBRILLATOR SITU 03/03/2010   Qualifier: Diagnosis of  By: Caryl Comes, MD, Leonidas Romberg Mack Guise   . CAD (coronary artery disease)    s/p prior LAD, LCx and RCA stenting remotely for an anterior MI, recurrent MI in 2010 with LAD stent occlusion s/p thrombectomy and PTCA, STEMI 12/2011 s/p DES-prox LAD  . Cardiomyopathy, ischemic 12/14/2011  . Chronic systolic CHF (congestive heart failure) ([place])   . Headache    migraines  . HTN (hypertension)   . Hyperlipidemia   . ICD (implantable cardiac defibrillator) in place   . Ischemic cardiomyopathy    EF 25-30% s/p single-chamber Med[employer]onic ICD   . NSVT (nonsustained ventricular tachycardia) ([place]) 12/14/2011  . Obesity   . STEMI (ST elevation myocardial infarction) ([place]) 12/12/2011  . Tobacco abuse   . TOBACCO USER 10/29/2009   Qualifier: Diagnosis of  By: Caryl Comes, MD, Remus Blake   . Tooth pain 12/14/2011  . VT (ventricular tachycardia) ([place]) 10/18/2012    Past Surgical History:  Procedure Laterality Date  . CARDIAC CATHETERIZATION  03/31/2009  . CARDIAC  CATHETERIZATION  05/08/2008   CARDIAC SIZE AND SILHOUETTE NORMAL. THERE WAS ANTERIOR APICAL HYPOKINESIS WITH EF 35-40%  . CARDIAC CATHETERIZATION  03/14/2018  . CARDIAC DEFIBRILLATOR PLACEMENT  11/2009  . CORONARY ANGIOPLASTY  07/19/2016   BALLOON ANGIOPLASTY TO THE LAD WITH THROMBUS EXTRACTION OF THE PREVIOUSLY STENTED SEGMENT AND NEW STENT PLACEMENT TO THE LEFT CIRCUMFLEX. EF IS DOWN IN THE 30% RANGE  . CORONARY ANGIOPLASTY WITH STENT PLACEMENT  12/12/2011   30% mid LCx, mid RCA & mid LCx stents patent. LVEF 25-30%, mid-distal anterolateral wall AK, apex dilated, dyskinetic s/p DES-prox LAD stenosis.  . CORONARY BALLOON ANGIOPLASTY N/A 07/19/2016   Procedure: Coronary Balloon Angioplasty;  Surgeon: Martinique, Peter M, MD;  Location: [facility];  Service: Cardiovascular;  Laterality: N/A;  . defib    . HAND SURGERY  1994   right hand  . ICD GENERATOR CHANGEOUT N/A 05/19/2018   Procedure: ICD GENERATOR CHANGEOUT;  Surgeon: Constance Haw, MD;  Location: [facility];  Service: Cardiovascular;  Laterality: N/A;  . LEFT HEART CATH Bilateral 12/12/2011   Procedure: LEFT HEART CATH;  Surgeon: Peter M Martinique, MD;  Location: [facility];  Service: Cardiovascular;  Laterality: Bilateral;  . LEFT HEART CATH AND CORONARY ANGIOGRAPHY N/A 07/19/2016   Procedure: Left Heart Cath and Coronary Angiography;  Surgeon: Martinique, Peter M, MD;  Location: [facility];  Service: Cardiovascular;  Laterality: N/A;  . LEFT HEART CATH AND CORONARY ANGIOGRAPHY N/A 03/14/2018   Procedure: LEFT HEART CATH AND CORONARY ANGIOGRAPHY;  Surgeon: Martinique, Peter M, MD;  Location: [facility];  Service: Cardiovascular;  Laterality: N/A;    Family History  Problem Relation Age of Onset  . Heart disease Father   . Cancer Mother     Social History:  reports that he has been smoking e-cigarettes. He has a 24.00 pack-year smoking history. He has never used smokeless tobacco. He reports current alcohol use. He  reports that he does not use drugs.   Medications Prior to Admission  Medication Sig Dispense Refill  . aspirin EC 81 MG EC tablet Take 1 tablet (81 mg total) by mouth daily. 30 tablet 3  . atorvastatin (LIPITOR) 80 MG tablet TAKE 1 TABLET EVERY DAY AT 6PM (NEED OFFICE VISIT) (Patient taking differently: Take 80 mg by mouth daily at 6 PM. ) 90 tablet 3  . carvedilol (COREG) 12.5 MG tablet Take 1 tablet (12.5 mg total) by mouth 2 (two) times daily. 180 tablet 3  . glipiZIDE (GLUCOTROL XL) 5 MG 24 hr tablet Take 5 mg by mouth daily. with food    . ibuprofen (ADVIL) 200 MG tablet Take 800 mg by mouth daily as needed for moderate pain.    [name] lisinopril (ZESTRIL) 10 MG tablet Take 1 tablet (10 mg total) by mouth daily. 90 tablet 3  . oxymetazoline (AFRIN) 0.05 % nasal spray Place 1 spray into both nostrils 2 (two) times daily.     . pantoprazole (PROTONIX) 40 MG tablet Take 1 tablet (40 mg total) by mouth every evening. 90 tablet 3  . cephALEXin (KEFLEX) 500 MG capsule Take 1 capsule (500 mg total) by mouth 3 (three) times daily. (Patient not taking: Reported on 12/21/2018) 15 capsule 0  . clopidogrel (PLAVIX) 75 MG tablet Take 75 mg by mouth daily.    . nitroGLYCERIN (NITROSTAT) 0.4 MG SL tablet Place 1 tablet (0.4 mg total) under the tongue every 5 (five) minutes as needed. For chest pain 25 tablet 3     Physical Exam: Left shoulder demonstrates painful and guarded motion with a positive impingement sign.  He maintains good strength to manual muscle testing.  His examination is otherwise as documented at his previous office visits.  His MRI scan is reviewed confirming severe bony impingement as well as AC joint arthritis.  Vitals  Temp:  [98.5 F (36.9 C)] 98.5 F (36.9 C) (12/10 1042) Pulse Rate:  [55-62] 59 (12/10 1140) Resp:  [17-25] 22 (12/10 1140) BP: (138-158)/(71-100) 158/100 (12/10 1135) SpO2:  [95 %-98 %] 96 % (12/10 1140) Weight:  [117.9 kg] 117.9 kg (12/10  1054)  Assessment/Plan  Impression: left shoulder impingement, acromioclavicular osteoarthritis  Plan of Action: Procedure(s): SHOULDER ARTHROSCOPY WITH SUBACROMIAL DECOMPRESSION AND DISTAL CLAVICLE EXCISION  Nicholaus Steinke M Cassadie Pankonin 12/21/2018, 11:41 AM Contact # [phone]

## 2018-12-23 NOTE — Progress Notes (Signed)
Remote ICD transmission.   

## 2019-01-25 ENCOUNTER — Ambulatory Visit: Payer: Medicare HMO | Attending: Internal Medicine

## 2019-01-25 DIAGNOSIS — Z20822 Contact with and (suspected) exposure to covid-19: Secondary | ICD-10-CM | POA: Diagnosis not present

## 2019-01-26 LAB — NOVEL CORONAVIRUS, NAA: SARS-CoV-2, NAA: NOT DETECTED

## 2019-02-06 DIAGNOSIS — E119 Type 2 diabetes mellitus without complications: Secondary | ICD-10-CM | POA: Diagnosis not present

## 2019-02-06 DIAGNOSIS — E785 Hyperlipidemia, unspecified: Secondary | ICD-10-CM | POA: Diagnosis not present

## 2019-02-06 DIAGNOSIS — R05 Cough: Secondary | ICD-10-CM | POA: Diagnosis not present

## 2019-02-06 DIAGNOSIS — I5020 Unspecified systolic (congestive) heart failure: Secondary | ICD-10-CM | POA: Diagnosis not present

## 2019-02-06 DIAGNOSIS — I110 Hypertensive heart disease with heart failure: Secondary | ICD-10-CM | POA: Diagnosis not present

## 2019-02-06 DIAGNOSIS — I10 Essential (primary) hypertension: Secondary | ICD-10-CM | POA: Diagnosis not present

## 2019-02-21 ENCOUNTER — Other Ambulatory Visit: Payer: Self-pay | Admitting: Cardiology

## 2019-02-26 ENCOUNTER — Ambulatory Visit (INDEPENDENT_AMBULATORY_CARE_PROVIDER_SITE_OTHER): Payer: Medicare HMO | Admitting: *Deleted

## 2019-02-26 DIAGNOSIS — I472 Ventricular tachycardia, unspecified: Secondary | ICD-10-CM

## 2019-03-02 LAB — CUP PACEART REMOTE DEVICE CHECK
Battery Remaining Longevity: 134 mo
Battery Voltage: 3.05 V
Brady Statistic RV Percent Paced: 0.01 %
Date Time Interrogation Session: 20210219192615
HighPow Impedance: 55 Ohm
HighPow Impedance: 84 Ohm
Implantable Lead Implant Date: 20111102
Implantable Lead Location: 753860
Implantable Lead Model: 6947
Implantable Pulse Generator Implant Date: 20200508
Lead Channel Impedance Value: 380 Ohm
Lead Channel Impedance Value: 437 Ohm
Lead Channel Pacing Threshold Amplitude: 0.875 V
Lead Channel Pacing Threshold Pulse Width: 0.4 ms
Lead Channel Sensing Intrinsic Amplitude: 28.5 mV
Lead Channel Sensing Intrinsic Amplitude: 28.5 mV
Lead Channel Setting Pacing Amplitude: 2.5 V
Lead Channel Setting Pacing Pulse Width: 0.4 ms
Lead Channel Setting Sensing Sensitivity: 0.3 mV

## 2019-03-02 NOTE — Progress Notes (Signed)
ICD Remote  

## 2019-05-19 ENCOUNTER — Other Ambulatory Visit: Payer: Self-pay | Admitting: Cardiology

## 2019-06-05 ENCOUNTER — Other Ambulatory Visit: Payer: Self-pay | Admitting: Internal Medicine

## 2019-07-31 ENCOUNTER — Ambulatory Visit (INDEPENDENT_AMBULATORY_CARE_PROVIDER_SITE_OTHER): Payer: Medicare HMO | Admitting: *Deleted

## 2019-07-31 DIAGNOSIS — I472 Ventricular tachycardia, unspecified: Secondary | ICD-10-CM

## 2019-07-31 LAB — CUP PACEART REMOTE DEVICE CHECK
Battery Remaining Longevity: 131 mo
Battery Voltage: 3.03 V
Brady Statistic RV Percent Paced: 0.01 %
Date Time Interrogation Session: 20210720004735
HighPow Impedance: 57 Ohm
HighPow Impedance: 84 Ohm
Implantable Lead Implant Date: 20111102
Implantable Lead Location: 753860
Implantable Lead Model: 6947
Implantable Pulse Generator Implant Date: 20200508
Lead Channel Impedance Value: 342 Ohm
Lead Channel Impedance Value: 456 Ohm
Lead Channel Pacing Threshold Amplitude: 1 V
Lead Channel Pacing Threshold Pulse Width: 0.4 ms
Lead Channel Sensing Intrinsic Amplitude: 18.625 mV
Lead Channel Sensing Intrinsic Amplitude: 18.625 mV
Lead Channel Setting Pacing Amplitude: 2.5 V
Lead Channel Setting Pacing Pulse Width: 0.4 ms
Lead Channel Setting Sensing Sensitivity: 0.3 mV

## 2019-08-02 NOTE — Progress Notes (Signed)
Remote ICD transmission.   

## 2019-08-03 ENCOUNTER — Other Ambulatory Visit: Payer: Self-pay | Admitting: Cardiology

## 2019-09-09 ENCOUNTER — Other Ambulatory Visit: Payer: Self-pay | Admitting: Cardiology

## 2019-10-19 ENCOUNTER — Other Ambulatory Visit: Payer: Self-pay | Admitting: Cardiology

## 2019-10-26 ENCOUNTER — Telehealth: Payer: Self-pay | Admitting: Cardiology

## 2019-10-26 NOTE — Telephone Encounter (Signed)
Patients wife called in to see if Dr. Swaziland can order a covid monoclonal antibody infusion due to a possible covid case with Jonathan Fischer. She states he is high risk. Please advise.

## 2019-10-26 NOTE — Telephone Encounter (Signed)
Spoke with patient wife (okay per DPR) who states patient was exposed to his niece on Tuesday of this week who then tested positive for covid on Thursday. Patient currently has not been tested and currently has no symptoms. Patients wife wanted to know if patient could receive the monoclonal antibody infusion. Reached out to [facility] through [facility]. Per that group the prophylactic injection is no longer available due to a shortage but advised me to let the patient know that if the patient were to test positive and had symptoms they could call [phone]) and the patient could be screened to see if he would qualify for the monoclonal antibody infusion. Advised patients wife to call the number above if patient were to develop symptoms and tested positive. Patients wife verbalized understanding.

## 2019-10-30 ENCOUNTER — Ambulatory Visit (INDEPENDENT_AMBULATORY_CARE_PROVIDER_SITE_OTHER): Payer: Medicare HMO

## 2019-10-30 DIAGNOSIS — I472 Ventricular tachycardia, unspecified: Secondary | ICD-10-CM

## 2019-11-02 LAB — CUP PACEART REMOTE DEVICE CHECK
Battery Remaining Longevity: 128 mo
Battery Voltage: 3.03 V
Brady Statistic RV Percent Paced: 0.01 %
Date Time Interrogation Session: 20211022021035
HighPow Impedance: 55 Ohm
HighPow Impedance: 85 Ohm
Implantable Lead Implant Date: 20111102
Implantable Lead Location: 753860
Implantable Lead Model: 6947
Implantable Pulse Generator Implant Date: 20200508
Lead Channel Impedance Value: 380 Ohm
Lead Channel Impedance Value: 437 Ohm
Lead Channel Pacing Threshold Amplitude: 1 V
Lead Channel Pacing Threshold Pulse Width: 0.4 ms
Lead Channel Sensing Intrinsic Amplitude: 19.75 mV
Lead Channel Sensing Intrinsic Amplitude: 19.75 mV
Lead Channel Setting Pacing Amplitude: 2.5 V
Lead Channel Setting Pacing Pulse Width: 0.4 ms
Lead Channel Setting Sensing Sensitivity: 0.3 mV

## 2019-11-03 ENCOUNTER — Other Ambulatory Visit: Payer: Self-pay | Admitting: Cardiology

## 2019-11-05 NOTE — Progress Notes (Signed)
Remote ICD transmission.   

## 2019-11-12 ENCOUNTER — Other Ambulatory Visit: Payer: Self-pay | Admitting: Cardiology

## 2019-11-28 ENCOUNTER — Other Ambulatory Visit: Payer: Self-pay | Admitting: Cardiology

## 2019-11-29 ENCOUNTER — Telehealth: Payer: Self-pay

## 2019-11-29 NOTE — Telephone Encounter (Signed)
Spoke to patient advised I received refill request for Atorvastatin.Advised your last office with Dr.Jordan 08/02/16.Stated he no longer sees Dr.Jordan.He is Dr.Klein's patient.Advised I will send message to Dr.Klein's RN for a refill.

## 2019-12-03 NOTE — Telephone Encounter (Signed)
OK to refill. I last saw him in Feb 2020 with his last cath. He does need to get an appointment though with me or an APP. Next available.   Javanna Patin Swaziland MD, [facility]

## 2019-12-03 NOTE — Telephone Encounter (Signed)
Attempted phone call to pt and left detailed message (ok according to Epic) Pt advised Dr Swaziland is his general cardiologist and will follow him for his history of CAD and lipid f/u.  Explained Dr Graciela Husbands will continue to follow pt's ICD. Advised pt he is past due for follow up with Dr Swaziland and Dr Graciela Husbands and to please schedule follow up appointments.

## 2019-12-03 NOTE — Telephone Encounter (Signed)
Attempted phone call to pt and left detailed message (ok according to Epic) Pt advised Dr Swaziland is his general cardiologist and will follow him for his history of CAD and lipid f/u.  Explained Dr Graciela Husbands will continue to follow pt's ICD. Advised pt he is past due for follow up with Dr Swaziland and Dr Graciela Husbands and to please schedule follow up appointments as soon as possible.

## 2019-12-04 NOTE — Telephone Encounter (Signed)
Per Dr Swaziland OK to refill Atorvastatin but does need to schedule a f/u appointment with Dr Swaziland or APP.

## 2019-12-25 ENCOUNTER — Other Ambulatory Visit: Payer: Self-pay | Admitting: Cardiology

## 2020-01-08 ENCOUNTER — Other Ambulatory Visit: Payer: Self-pay | Admitting: Internal Medicine

## 2020-01-12 ENCOUNTER — Other Ambulatory Visit: Payer: Self-pay | Admitting: Internal Medicine

## 2020-01-29 ENCOUNTER — Other Ambulatory Visit: Payer: Self-pay | Admitting: Internal Medicine

## 2020-01-29 ENCOUNTER — Other Ambulatory Visit: Payer: Self-pay | Admitting: Cardiology

## 2020-02-19 ENCOUNTER — Other Ambulatory Visit: Payer: Self-pay | Admitting: Cardiology

## 2020-02-19 ENCOUNTER — Ambulatory Visit (INDEPENDENT_AMBULATORY_CARE_PROVIDER_SITE_OTHER): Payer: Medicare HMO

## 2020-02-19 DIAGNOSIS — I472 Ventricular tachycardia, unspecified: Secondary | ICD-10-CM

## 2020-02-19 LAB — CUP PACEART REMOTE DEVICE CHECK
Battery Remaining Longevity: 126 mo
Battery Voltage: 3.02 V
Brady Statistic RV Percent Paced: 0.01 %
Date Time Interrogation Session: 20220208083225
HighPow Impedance: 63 Ohm
HighPow Impedance: 91 Ohm
Implantable Lead Implant Date: 20111102
Implantable Lead Location: 753860
Implantable Lead Model: 6947
Implantable Pulse Generator Implant Date: 20200508
Lead Channel Impedance Value: 380 Ohm
Lead Channel Impedance Value: 456 Ohm
Lead Channel Pacing Threshold Amplitude: 1.25 V
Lead Channel Pacing Threshold Pulse Width: 0.4 ms
Lead Channel Sensing Intrinsic Amplitude: 21 mV
Lead Channel Sensing Intrinsic Amplitude: 21 mV
Lead Channel Setting Pacing Amplitude: 2.5 V
Lead Channel Setting Pacing Pulse Width: 0.4 ms
Lead Channel Setting Sensing Sensitivity: 0.3 mV

## 2020-02-25 NOTE — Progress Notes (Signed)
Remote ICD transmission.   

## 2020-04-02 DIAGNOSIS — E1165 Type 2 diabetes mellitus with hyperglycemia: Secondary | ICD-10-CM | POA: Diagnosis not present

## 2020-04-02 DIAGNOSIS — M25512 Pain in left shoulder: Secondary | ICD-10-CM | POA: Diagnosis not present

## 2020-04-02 DIAGNOSIS — I10 Essential (primary) hypertension: Secondary | ICD-10-CM | POA: Diagnosis not present

## 2020-04-02 DIAGNOSIS — I2510 Atherosclerotic heart disease of native coronary artery without angina pectoris: Secondary | ICD-10-CM | POA: Diagnosis not present

## 2020-04-02 DIAGNOSIS — I252 Old myocardial infarction: Secondary | ICD-10-CM | POA: Diagnosis not present

## 2020-04-02 DIAGNOSIS — E785 Hyperlipidemia, unspecified: Secondary | ICD-10-CM | POA: Diagnosis not present

## 2020-04-02 DIAGNOSIS — S43102A Unspecified dislocation of left acromioclavicular joint, initial encounter: Secondary | ICD-10-CM | POA: Diagnosis not present

## 2020-04-02 DIAGNOSIS — B379 Candidiasis, unspecified: Secondary | ICD-10-CM | POA: Diagnosis not present

## 2020-04-02 DIAGNOSIS — S4352XA Sprain of left acromioclavicular joint, initial encounter: Secondary | ICD-10-CM | POA: Diagnosis not present

## 2020-04-02 DIAGNOSIS — Z95810 Presence of automatic (implantable) cardiac defibrillator: Secondary | ICD-10-CM | POA: Diagnosis not present

## 2020-04-09 DIAGNOSIS — M25512 Pain in left shoulder: Secondary | ICD-10-CM | POA: Diagnosis not present

## 2020-04-09 DIAGNOSIS — N475 Adhesions of prepuce and glans penis: Secondary | ICD-10-CM | POA: Diagnosis not present

## 2020-04-09 DIAGNOSIS — Z95810 Presence of automatic (implantable) cardiac defibrillator: Secondary | ICD-10-CM | POA: Diagnosis not present

## 2020-04-09 DIAGNOSIS — E1165 Type 2 diabetes mellitus with hyperglycemia: Secondary | ICD-10-CM | POA: Diagnosis not present

## 2020-04-10 DIAGNOSIS — E785 Hyperlipidemia, unspecified: Secondary | ICD-10-CM | POA: Diagnosis not present

## 2020-04-10 DIAGNOSIS — I5020 Unspecified systolic (congestive) heart failure: Secondary | ICD-10-CM | POA: Diagnosis not present

## 2020-04-10 DIAGNOSIS — I252 Old myocardial infarction: Secondary | ICD-10-CM | POA: Diagnosis not present

## 2020-04-10 DIAGNOSIS — E1165 Type 2 diabetes mellitus with hyperglycemia: Secondary | ICD-10-CM | POA: Diagnosis not present

## 2020-04-11 ENCOUNTER — Other Ambulatory Visit: Payer: Self-pay | Admitting: Internal Medicine

## 2020-04-12 ENCOUNTER — Other Ambulatory Visit: Payer: Self-pay | Admitting: Cardiology

## 2020-04-17 DIAGNOSIS — N475 Adhesions of prepuce and glans penis: Secondary | ICD-10-CM | POA: Diagnosis not present

## 2020-04-30 ENCOUNTER — Encounter (HOSPITAL_BASED_OUTPATIENT_CLINIC_OR_DEPARTMENT_OTHER): Payer: Self-pay

## 2020-04-30 ENCOUNTER — Other Ambulatory Visit: Payer: Self-pay

## 2020-04-30 ENCOUNTER — Emergency Department (HOSPITAL_BASED_OUTPATIENT_CLINIC_OR_DEPARTMENT_OTHER)
Admission: EM | Admit: 2020-04-30 | Discharge: 2020-05-01 | Disposition: A | Discharge status: Home / Self Care | Payer: Medicare HMO | Attending: Emergency Medicine | Admitting: Emergency Medicine

## 2020-04-30 DIAGNOSIS — I110 Hypertensive heart disease with heart failure: Secondary | ICD-10-CM | POA: Diagnosis not present

## 2020-04-30 DIAGNOSIS — U071 COVID-19: Secondary | ICD-10-CM | POA: Diagnosis not present

## 2020-04-30 DIAGNOSIS — S4992XA Unspecified injury of left shoulder and upper arm, initial encounter: Secondary | ICD-10-CM | POA: Diagnosis present

## 2020-04-30 DIAGNOSIS — Z7902 Long term (current) use of antithrombotics/antiplatelets: Secondary | ICD-10-CM | POA: Insufficient documentation

## 2020-04-30 DIAGNOSIS — I10 Essential (primary) hypertension: Secondary | ICD-10-CM | POA: Diagnosis not present

## 2020-04-30 DIAGNOSIS — F17290 Nicotine dependence, other tobacco product, uncomplicated: Secondary | ICD-10-CM | POA: Diagnosis not present

## 2020-04-30 DIAGNOSIS — I25110 Atherosclerotic heart disease of native coronary artery with unstable angina pectoris: Secondary | ICD-10-CM | POA: Diagnosis not present

## 2020-04-30 DIAGNOSIS — Z79899 Other long term (current) drug therapy: Secondary | ICD-10-CM | POA: Diagnosis not present

## 2020-04-30 DIAGNOSIS — S46912A Strain of unspecified muscle, fascia and tendon at shoulder and upper arm level, left arm, initial encounter: Secondary | ICD-10-CM | POA: Insufficient documentation

## 2020-04-30 DIAGNOSIS — S29012A Strain of muscle and tendon of back wall of thorax, initial encounter: Secondary | ICD-10-CM | POA: Diagnosis not present

## 2020-04-30 DIAGNOSIS — X58XXXA Exposure to other specified factors, initial encounter: Secondary | ICD-10-CM | POA: Insufficient documentation

## 2020-04-30 DIAGNOSIS — M546 Pain in thoracic spine: Secondary | ICD-10-CM | POA: Diagnosis not present

## 2020-04-30 DIAGNOSIS — I5022 Chronic systolic (congestive) heart failure: Secondary | ICD-10-CM | POA: Insufficient documentation

## 2020-04-30 DIAGNOSIS — Z7982 Long term (current) use of aspirin: Secondary | ICD-10-CM | POA: Diagnosis not present

## 2020-04-30 NOTE — ED Triage Notes (Signed)
Patient here POV from Home with Back Pain.  Patient felt Generalized Body Aches and Weakness 2 nights ago and Patient tested positive for COVID-19 yesterday. Back Pain began yesterday and has been worsening since.  No SOB, No CP, No Cough.   PCP concerned for PE and sent for Evaluation.   Ambulatory, [facility] 15.

## 2020-04-30 NOTE — ED Notes (Signed)
Patient made aware that they may have one visitor in the examination room at this time. Visitor made aware that they may not leave the examination room unless to use the restroom or leave the building (in which case they may not return unless theres an extenuating circumstance). Both the Visitor and Patient are agreeable at this Time. Both instructed on Call Bell use.

## 2020-05-01 ENCOUNTER — Emergency Department (HOSPITAL_BASED_OUTPATIENT_CLINIC_OR_DEPARTMENT_OTHER): Payer: Medicare HMO

## 2020-05-01 ENCOUNTER — Encounter (HOSPITAL_BASED_OUTPATIENT_CLINIC_OR_DEPARTMENT_OTHER): Payer: Self-pay | Admitting: Radiology

## 2020-05-01 DIAGNOSIS — R079 Chest pain, unspecified: Secondary | ICD-10-CM | POA: Diagnosis not present

## 2020-05-01 DIAGNOSIS — R911 Solitary pulmonary nodule: Secondary | ICD-10-CM | POA: Diagnosis not present

## 2020-05-01 DIAGNOSIS — R531 Weakness: Secondary | ICD-10-CM | POA: Diagnosis not present

## 2020-05-01 DIAGNOSIS — I517 Cardiomegaly: Secondary | ICD-10-CM | POA: Diagnosis not present

## 2020-05-01 LAB — CBC
HCT: 48.9 % (ref 39.0–52.0)
Hemoglobin: 17.1 g/dL — ABNORMAL HIGH (ref 13.0–17.0)
MCH: 31.7 pg (ref 26.0–34.0)
MCHC: 35 g/dL (ref 30.0–36.0)
MCV: 90.7 fL (ref 80.0–100.0)
Platelets: 170 10*3/uL (ref 150–400)
RBC: 5.39 MIL/uL (ref 4.22–5.81)
RDW: 12.8 % (ref 11.5–15.5)
WBC: 5 10*3/uL (ref 4.0–10.5)
nRBC: 0 % (ref 0.0–0.2)

## 2020-05-01 LAB — COMPREHENSIVE METABOLIC PANEL
ALT: 47 U/L — ABNORMAL HIGH (ref 0–44)
AST: 34 U/L (ref 15–41)
Albumin: 4.5 g/dL (ref 3.5–5.0)
Alkaline Phosphatase: 37 U/L — ABNORMAL LOW (ref 38–126)
Anion gap: 10 (ref 5–15)
BUN: 20 mg/dL (ref 6–20)
CO2: 27 mmol/L (ref 22–32)
Calcium: 9.6 mg/dL (ref 8.9–10.3)
Chloride: 101 mmol/L (ref 98–111)
Creatinine, Ser: 0.84 mg/dL (ref 0.61–1.24)
GFR, Estimated: >60 mL/min (ref >60)
Glucose, Bld: 140 mg/dL — ABNORMAL HIGH (ref 70–99)
Potassium: 3.8 mmol/L (ref 3.5–5.1)
Sodium: 138 mmol/L (ref 135–145)
Total Bilirubin: 0.5 mg/dL (ref 0.3–1.2)
Total Protein: 7.6 g/dL (ref 6.5–8.1)

## 2020-05-01 MED ORDER — IOHEXOL 350 MG/ML SOLN
100.0000 mL | Freq: Once | INTRAVENOUS | Status: AC | PRN
  Administered 2020-05-01: 100 mL via INTRAVENOUS

## 2020-05-01 MED ORDER — KETOROLAC TROMETHAMINE 15 MG/ML IJ SOLN
15.0000 mg | Freq: Once | INTRAMUSCULAR | Status: AC
  Administered 2020-05-01: 15 mg via INTRAVENOUS
  Filled 2020-05-01: qty 1

## 2020-05-01 NOTE — ED Provider Notes (Addendum)
[facility] EMERGENCY DEPT Provider Note  CSN: [id number] Arrival date & time: 04/30/20 2319  Chief Complaint(s) Back Pain  HPI Jonathan Fischer is a 50 y.o. male here for several days of right parascapular pain worse with movement and certain positions.  Patient tested positive for COVID-19 2 days ago.  Reports that the pain began after the initial night of shivering and rigors.  Patient has a chronic cough which is unchanged.  He had fevers and chills which subsided.  No nausea or vomiting.  No chest pain or shortness of breath.  No abdominal pain.  Patient has tried over-the-counter medications as well as massage, stretching and muscle creams with minimal relief.  He called the emergency hotline for his PCP who recommended he be assessed for possible pulmonary embolism, prompting his visit.    HPI  Past Medical History Past Medical History:  Diagnosis Date  . Abnormal echocardiogram    a. 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  [name] AICD (automatic cardioverter/defibrillator) present 11/2009  . Arthritis   . AUTOMATIC IMPLANTABLE CARDIAC DEFIBRILLATOR SITU 03/03/2010   Qualifier: Diagnosis of  By: Graciela Husbands, MD, Ruthann Cancer Ty Hilts   . CAD (coronary artery disease)    s/p prior LAD, LCx and RCA stenting remotely for an anterior MI, recurrent MI in 2010 with LAD stent occlusion s/p thrombectomy and PTCA, STEMI 12/2011 s/p DES-prox LAD  . Cardiomyopathy, ischemic 12/14/2011  . Chronic systolic CHF (congestive heart failure) (HCC)   . Headache    migraines  . HTN (hypertension)   . Hyperlipidemia   . ICD (implantable cardiac defibrillator) in place   . Ischemic cardiomyopathy    EF 25-30% s/p single-chamber Medtronic ICD   . NSVT (nonsustained ventricular tachycardia) (HCC) 12/14/2011  . Obesity   . STEMI (ST elevation myocardial infarction) (HCC) 12/12/2011  .  Tobacco abuse   . TOBACCO USER 10/29/2009   Qualifier: Diagnosis of  By: Graciela Husbands, MD, Susie Cassette   . Tooth pain 12/14/2011  . VT (ventricular tachycardia) (HCC) 10/18/2012   Patient Active Problem List   Diagnosis Date Noted  . Angina pectoris (HCC) 03/14/2018  . VT (ventricular tachycardia) (HCC) 10/18/2012  . Tooth pain 12/14/2011  . Cardiomyopathy, ischemic 12/14/2011  . NSVT (nonsustained ventricular tachycardia) (HCC) 12/14/2011  . STEMI (ST elevation myocardial infarction) (HCC) 12/12/2011  . Obesity   . Hyperlipidemia   . Coronary artery disease involving native coronary artery of native heart with unstable angina pectoris (HCC) 06/03/2010  . Chronic systolic congestive heart failure (HCC) 06/03/2010  . AUTOMATIC IMPLANTABLE CARDIAC DEFIBRILLATOR SITU 03/03/2010  . TOBACCO USER 10/29/2009   Home Medication(s) Prior to Admission medications   Medication Sig Start Date End Date Taking? Authorizing Provider  aspirin EC 81 MG EC tablet Take 1 tablet (81 mg total) by mouth daily. 12/14/11   Arguello, Roger A, PA-C  atorvastatin (LIPITOR) 80 MG tablet TAKE 1 TABLET EVERY DAY AT 6PM (NEED MD APPOINTMENT FOR REFILLS) 04/14/20   [place], Peter M, MD  carvedilol (COREG) 12.5 MG tablet TAKE 1 TABLET TWICE DAILY (PLEASE SCHEDULE APPOINTMENT FOR FUTURE REFILLS) 01/08/20   [place], Peter M, MD  cephALEXin (KEFLEX) 500 MG capsule Take 1 capsule (500 mg total) by mouth 3 (three) times daily. Patient not taking: Reported on 12/21/2018 06/22/18   Graciella Freer, PA-C  clopidogrel (PLAVIX) 75 MG tablet Take 75 mg by mouth daily.  [provider]  cyclobenzaprine (FLEXERIL) 10 MG tablet Take 1 tablet (10 mg total) by mouth 3 (three) times daily as needed for muscle spasms. 12/21/18   Shuford, French Ana, PA-C  glipiZIDE (GLUCOTROL XL) 5 MG 24 hr tablet Take 5 mg by mouth daily. with food 08/28/18   [provider]  ibuprofen (ADVIL) 200 MG tablet Take 800 mg by mouth daily  as needed for moderate pain.    [provider]  lisinopril (ZESTRIL) 10 MG tablet Take 1 tablet (10 mg total) by mouth daily. Please make overdue appt with Dr. Graciela Husbands before anymore refills. Thank you Final Attempt 01/15/20   Duke Salvia, MD  nitroGLYCERIN (NITROSTAT) 0.4 MG SL tablet Place 1 tablet (0.4 mg total) under the tongue every 5 (five) minutes as needed. For chest pain 07/13/16   [place], Peter M, MD  ondansetron [facility]) 4 MG tablet Take 1 tablet (4 mg total) by mouth every 8 (eight) hours as needed for nausea or vomiting. 12/21/18   Shuford, French Ana, PA-C  oxyCODONE-acetaminophen (PERCOCET) 5-325 MG tablet Take 1 tablet by mouth every 4 (four) hours as needed (max 6 q). 12/21/18   Shuford, French Ana, PA-C  oxymetazoline (AFRIN) 0.05 % nasal spray Place 1 spray into both nostrils 2 (two) times daily.     [provider]  pantoprazole (PROTONIX) 40 MG tablet TAKE 1 TABLET EVERY EVENING (PLEASE SCHEDULE APPOINTMENT FOR FUTURE REFILLS) 04/11/20   [place], Peter M, MD                                                                                                                                    Past Surgical History Past Surgical History:  Procedure Laterality Date  . CARDIAC CATHETERIZATION  03/31/2009  . CARDIAC CATHETERIZATION  05/08/2008   CARDIAC SIZE AND SILHOUETTE NORMAL. THERE WAS ANTERIOR APICAL HYPOKINESIS WITH EF 35-40%  . CARDIAC CATHETERIZATION  03/14/2018  . CARDIAC DEFIBRILLATOR PLACEMENT  11/2009  . CORONARY ANGIOPLASTY  07/19/2016   BALLOON ANGIOPLASTY TO THE LAD WITH THROMBUS EXTRACTION OF THE PREVIOUSLY STENTED SEGMENT AND NEW STENT PLACEMENT TO THE LEFT CIRCUMFLEX. EF IS DOWN IN THE 30% RANGE  . CORONARY ANGIOPLASTY WITH STENT PLACEMENT  12/12/2011   30% mid LCx, mid RCA & mid LCx stents patent. LVEF 25-30%, mid-distal anterolateral wall AK, apex dilated, dyskinetic s/p DES-prox LAD stenosis.  . CORONARY BALLOON ANGIOPLASTY N/A 07/19/2016   Procedure: Coronary  Balloon Angioplasty;  Surgeon: [place], Peter M, MD;  Location: [facility] INVASIVE CV LAB;  Service: Cardiovascular;  Laterality: N/A;  . defib    . HAND SURGERY  1994   right hand  . ICD GENERATOR CHANGEOUT N/A 05/19/2018   Procedure: ICD GENERATOR CHANGEOUT;  Surgeon: Regan Lemming, MD;  Location: [facility] INVASIVE CV LAB;  Service: Cardiovascular;  Laterality: N/A;  . LEFT HEART CATH Bilateral 12/12/2011   Procedure: LEFT HEART CATH;  Surgeon: Peter M Swaziland, MD;  Location: [facility] CATH LAB;  Service: Cardiovascular;  Laterality: Bilateral;  . LEFT HEART CATH AND CORONARY ANGIOGRAPHY N/A 07/19/2016   Procedure: Left Heart Cath and Coronary Angiography;  Surgeon: [place], Peter M, MD;  Location: [facility] INVASIVE CV LAB;  Service: Cardiovascular;  Laterality: N/A;  . LEFT HEART CATH AND CORONARY ANGIOGRAPHY N/A 03/14/2018   Procedure: LEFT HEART CATH AND CORONARY ANGIOGRAPHY;  Surgeon: [place], Peter M, MD;  Location: [facility] INVASIVE CV LAB;  Service: Cardiovascular;  Laterality: N/A;   Family History Family History  Problem Relation Age of Onset  . Heart disease Father   . Cancer Mother     Social History Social History   Tobacco Use  . Smoking status: Current Every Day Smoker    Packs/day: 1.00    Years: 24.00    Pack years: 24.00    Types: E-cigarettes  . Smokeless tobacco: Never Used  Vaping Use  . Vaping Use: Never used  Substance Use Topics  . Alcohol use: Yes    Comment: Occassionally.  . Drug use: No   Allergies Patient has no known allergies.  Review of Systems Review of Systems All other systems are reviewed and are negative for acute change except as noted in the HPI  Physical Exam Vital Signs  I have reviewed the triage vital signs BP 138/80 (BP Location: Right Arm)   Pulse 60   Temp 98.5 F (36.9 C) (Oral)   Resp 16   Ht  (1.854 m)   Wt 106.6 kg   SpO2 98%   BMI 31.00 kg/m   Physical Exam Vitals reviewed.  Constitutional:      General: He is not in acute distress.     Appearance: He is well-developed. He is not diaphoretic.  HENT:     Head: Normocephalic and atraumatic.     Nose: Nose normal.  Eyes:     General: No scleral icterus.       Right eye: No discharge.        Left eye: No discharge.     Conjunctiva/sclera: Conjunctivae normal.     Pupils: Pupils are equal, round, and reactive to light.  Cardiovascular:     Rate and Rhythm: Normal rate and regular rhythm.     Heart sounds: No murmur heard. No friction rub. No gallop.   Pulmonary:     Effort: Pulmonary effort is normal. No respiratory distress.     Breath sounds: No stridor.  Abdominal:     General: There is no distension.     Palpations: Abdomen is soft.     Tenderness: There is no abdominal tenderness.  Musculoskeletal:     Cervical back: Normal range of motion and neck supple.     Thoracic back: Spasms and tenderness present. No bony tenderness.       Back:  Skin:    General: Skin is warm and dry.     Findings: No erythema or rash.  Neurological:     Mental Status: He is alert and oriented to person, place, and time.     ED Results and Treatments Labs (all labs ordered are listed, but only abnormal results are displayed) Labs Reviewed  RESP PANEL BY RT-PCR (FLU A&B, COVID) ARPGX2 - Abnormal; Notable for the following components:      Result Value   SARS Coronavirus 2 by RT PCR POSITIVE (*)    All other components within normal limits  CBC - Abnormal; Notable for the following components:   Hemoglobin 17.1 (*)  All other components within normal limits  COMPREHENSIVE METABOLIC PANEL - Abnormal; Notable for the following components:   Glucose, Bld 140 (*)    ALT 47 (*)    Alkaline Phosphatase 37 (*)    All other components within normal limits                                                                                                                         EKG  EKG Interpretation  Date/Time:  Thursday May 01 2020 00:31:46 EDT Ventricular Rate:  59 PR  Interval:  136 QRS Duration: 98 QT Interval:  468 QTC Calculation: 464 R Axis:   114 Text Interpretation: Sinus rhythm Abnormal lateral Q waves Anterior infarct, old No acute changes Confirmed by Drema Pry [phone]) on 05/01/2020 12:49:33 AM      Radiology CT Angio Chest PE W and/or Wo Contrast  Result Date: 05/01/2020 CLINICAL DATA:  Generalized body aches and weakness which began 2 nights prior, COVID-19 positive yesterday, back pain now worsening EXAM: CT ANGIOGRAPHY CHEST WITH CONTRAST TECHNIQUE: Multidetector CT imaging of the chest was performed using the standard protocol during bolus administration of intravenous contrast. Multiplanar CT image reconstructions and MIPs were obtained to evaluate the vascular anatomy. CONTRAST:  OMNIPAQUE IOHEXOL 350 MG/ML SOLN COMPARISON:  Radiograph 02/06/2019 FINDINGS: Cardiovascular: Satisfactory opacification the pulmonary arteries to the segmental level. No pulmonary artery filling defects are identified. Central pulmonary arteries are normal caliber. Borderline cardiomegaly. Small amount of sub endocardial fat ballooning towards the cardiac apex may reflect sequela of prior infarct. Terminus of a single pacer lead is seen directed towards the apical septum. Pacer pack in the left chest wall. Three-vessel coronary artery atherosclerosis with evidence of prior coronary stenting. Suboptimal opacification of the aortic for luminal assessment. No gross acute aortic abnormality. Normal caliber aorta. Minimal atherosclerotic plaque. Dilatation of the ascending thoracic aorta to 4.3 cm. Return to a normal caliber by the distal aortic arch 2.8 cm. Normal 3 vessel branching of the aortic arch. Proximal great vessels are free of acute abnormality with minimal plaque. Mediastinum/Nodes: No mediastinal fluid or gas. Normal thyroid gland and thoracic inlet. No acute abnormality of the trachea or esophagus. No worrisome mediastinal, hilar or axillary adenopathy.  Lungs/Pleura: Dependent atelectatic changes are noted. These are likely accentuated by imaging during exhalation. Mild centrilobular and paraseptal emphysematous changes. Diffuse airways thickening and scattered secretions could reflect acute or chronic bronchitic change. 4 mm subpleural nodule in the right upper lobe (4/85). Additional 4 mm subpleural nodule in the right apex as well (4/40). No other concerning pulmonary nodules or masses. Upper Abdomen: No acute abnormalities present in the visualized portions of the upper abdomen. Musculoskeletal: No chest wall abnormality. No acute or significant osseous findings. Review of the MIP images confirms the above findings. IMPRESSION: 1. No evidence of acute pulmonary embolism. 2. Mild atelectatic changes. 3. Mild airways thickening and scattered secretions could reflect acute or chronic bronchitic change. 4. Mild centrilobular and paraseptal  emphysema ( Emphysema (ICD10-J43.9).). 5. Three-vessel coronary artery atherosclerosis with evidence of prior coronary stenting. Small amount of sub endocardial fat and ballooning towards the cardiac apex may reflect sequela of prior infarct. 6. Two small sub 5 mm subpleural nodules in the right upper lobe. No follow-up needed if patient is low-risk (and has no known or suspected primary neoplasm). Non-contrast chest CT can be considered in 12 months if patient is high-risk. This recommendation follows the consensus statement: Guidelines for Management of Incidental Pulmonary Nodules Detected on CT Images: From the Fleischner Society 2017; Radiology 2017; 284:228-243. 7. Aortic Atherosclerosis (ICD10-I70.0) 8. 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 Recommend annual imaging followup by CTA or MRA. This recommendation follows 2010 ACCF/AHA/AATS/ACR/ASA/SCA/SCAI/SIR/STS/SVM Guidelines for the Diagnosis and Management of Patients with Thoracic Aortic Disease. Circulation. 2010; 121: O756-E332. Aortic aneurysm NOS  (ICD10-I71.9) Electronically Signed   By: Kreg Shropshire M.D.   On: 05/01/2020 01:58    Pertinent labs & imaging results that were available during my care of the patient were reviewed by me and considered in my medical decision making (see chart for details).  Medications Ordered in ED Medications  ketorolac (TORADOL) 15 MG/ML injection 15 mg (has no administration in time range)  iohexol (OMNIPAQUE) 350 MG/ML injection 100 mL (100 mLs Intravenous Contrast Given 05/01/20 0136)                                                                                                                                    Procedures Procedures  (including critical care time)  Medical Decision Making / ED Course I have reviewed the nursing notes for this encounter and the patient's prior records (if available in EHR or on provided paperwork).   MELCHOR KIRCHGESSNER Fischer was evaluated in Emergency Department on 05/01/2020 for the symptoms described in the history of present illness. He was evaluated in the context of the global COVID-19 pandemic, which necessitated consideration that the patient might be at risk for infection with the SARS-CoV-2 virus that causes COVID-19. Institutional protocols and algorithms that pertain to the evaluation of patients at risk for COVID-19 are in a state of rapid change based on information released by regulatory bodies including the CDC and federal and state organizations. These policies and algorithms were followed during the patient's care in the ED.  Confirmed COVID-positive. Work-up reassuring without leukocytosis. CTA obtained and negative for PE or PTx. EKG without acute ischemic changes.  Do not believe this is cardiac related. Feel this is likely muscular in nature. Continue supportive management recommended.       Final Clinical Impression(s) / ED Diagnoses Final diagnoses:  Acute right-sided thoracic back pain  Muscle strain of left shoulder, initial encounter   COVID-19 virus infection   The patient appears reasonably screened and/or stabilized for discharge and I doubt any other medical condition or other [facility] requiring further screening, evaluation, or treatment in the  ED at this time prior to discharge. Safe for discharge with strict return precautions.  Disposition: Discharge  Condition: Good  I have discussed the results, Dx and Tx plan with the patient/family who expressed understanding and agree(s) with the plan. Discharge instructions discussed at length. The patient/family was given strict return precautions who verbalized understanding of the instructions. No further questions at time of discharge.    ED Discharge Orders    None      Follow Up: Pearson GrippeKim, James, MD [address] [place] [id number] [phone]  Call  as needed      This chart was dictated using voice recognition software.  Despite best efforts to proofread,  errors can occur which can change the documentation meaning.     Nira Connardama, Eisen Robenson Eduardo, MD 05/01/20 [phone]

## 2020-05-01 NOTE — ED Notes (Signed)
EDP Cardama made aware of COVID-19 Results. No New Order.

## 2020-05-01 NOTE — Discharge Instructions (Addendum)
You may use over-the-counter Motrin (Ibuprofen), Acetaminophen (Tylenol), topical muscle creams such as SalonPas, Icy Hot, Bengay, etc. Please stretch, apply ice or heat (whichever helps), and have massage therapy for additional assistance.  

## 2020-05-01 NOTE — ED Notes (Signed)
Patient returned from CT

## 2020-05-02 ENCOUNTER — Telehealth: Payer: Self-pay | Admitting: Nurse Practitioner

## 2020-05-02 LAB — RESP PANEL BY RT-PCR (FLU A&B, COVID) ARPGX2
Influenza A by PCR: NEGATIVE
Influenza B by PCR: NEGATIVE
SARS Coronavirus 2 by RT PCR: POSITIVE — AB

## 2020-05-02 NOTE — Telephone Encounter (Signed)
Called to Discuss with patient about Covid symptoms and the use of the monoclonal antibody/antiviral infusion/oral therapies for those with mild to moderate Covid symptoms and at a high risk of hospitalization.     Pt appears to qualify for this infusion due to co-morbid conditions and/or a member of an at-risk group in accordance with the FDA Emergency Use Authorization.   Patient has declined oral and IV therapies. Advised to call back if he changes his mind.   Symptom onset: 4/20 Vaccinated: No Booster: No  Immunocompromised:No Qualifiers: htn, obesity, cad  Willette Alma, NP [facility]  [phone]

## 2020-05-05 ENCOUNTER — Other Ambulatory Visit: Payer: Self-pay | Admitting: Cardiology

## 2020-05-06 ENCOUNTER — Other Ambulatory Visit: Payer: Self-pay | Admitting: Cardiology

## 2020-05-13 DIAGNOSIS — E119 Type 2 diabetes mellitus without complications: Secondary | ICD-10-CM | POA: Diagnosis not present

## 2020-05-20 DIAGNOSIS — E1165 Type 2 diabetes mellitus with hyperglycemia: Secondary | ICD-10-CM | POA: Diagnosis not present

## 2020-05-20 DIAGNOSIS — E785 Hyperlipidemia, unspecified: Secondary | ICD-10-CM | POA: Diagnosis not present

## 2020-05-20 DIAGNOSIS — I252 Old myocardial infarction: Secondary | ICD-10-CM | POA: Diagnosis not present

## 2020-05-20 DIAGNOSIS — I5020 Unspecified systolic (congestive) heart failure: Secondary | ICD-10-CM | POA: Diagnosis not present

## 2020-05-20 DIAGNOSIS — Z794 Long term (current) use of insulin: Secondary | ICD-10-CM | POA: Diagnosis not present

## 2020-05-22 ENCOUNTER — Telehealth: Payer: Self-pay

## 2020-05-22 NOTE — Telephone Encounter (Signed)
NOTES ON FILE FROM [facility] [phone], SENT REFERRAL TO SCHEDULING 

## 2020-05-29 ENCOUNTER — Other Ambulatory Visit: Payer: Self-pay | Admitting: Cardiology

## 2020-06-16 NOTE — Progress Notes (Deleted)
Jonathan Fischer Date of Birth: [birth year] Medical Record #[id number]  History of Present Illness: Jonathan Fischer is seen back today for follow up CAD. Last seen by me in March 2020 in the hospital.   He  has extensive CAD. Has an ischemic CM with EF of 25 to 30%. Has an ICD in place. Has had multiple MIs and multiple PCIs.   He reports his first stent(s) was 15 years ago at [facility]. He returned 6 months later and had another stent placed. He had a  STEMI in December 2013 with occluded proximal LAD treated with repeat DES of the proximal LAD (occluded at the site of the prior stent). Stent in the LCX was patent. Stent in the mid RCA was patent. EF 25 to 30%. Troponin over 20. Echo shoed EF of 25 to 30% as well, septal apical and anterior wall AK and possible small layer of mural apical thrombus without mobility. Not felt to be a candidate for further anticoagulation due to his noncompliance.   He was readmitted in October 2014 with an ICD discharge. Interrogation confirmed this was ventricular tachycardia. He had normal cardiac enzymes. Potassium was mildly decreased. He had no evidence of increased congestive heart failure. We recommended continued medical therapy with beta blocker and potassium repletion. Echo showed ejection fraction was in the range of 30% to 35%. Akinesis of the mid-distalanteroseptal myocardium. Myoview showed a large anteroseptal scar without ischemia. ejection fraction was in the range of 30% to 35%. Akinesis of the mid-distalanteroseptal myocardium.  ICD interrogation and followup showed no recurrent episodes of ventricular tachycardia. His last remote ICD transmission was in Feb 2022.   Was seen in 2018 he was off all medication for 2 years. Underwent cardiac cath that showed focal in stent restenosis in RCA treated successfully with cutting balloon angioplasty. Medication resumed including ASA, plavix, lisinopril, lipitor, and Toprol.   In January 2020 he reached ERI of his  ICD and had generator change out.  In March 2020 he presented with chest pain and cardiac cath was done showing only distal LAD disease. Stents were patent.    On follow up today he is feeling much better. Seeing Dr. Selena Batten for his diabetes and is on Trulicity. Following a low carb diet. Walking 1-2 miles per day. Anginal symptoms resolved. Still smoking > 1ppd. Tolerating medication well. Seems to be taking his health more seriously now.   Current Outpatient Medications on File Prior to Visit  Medication Sig Dispense Refill  . aspirin EC 81 MG EC tablet Take 1 tablet (81 mg total) by mouth daily. 30 tablet 3  . atorvastatin (LIPITOR) 80 MG tablet TAKE 1 TABLET EVERY DAY AT 6PM (NEED MD APPOINTMENT FOR REFILLS) 15 tablet 0  . carvedilol (COREG) 12.5 MG tablet TAKE 1 TABLET TWICE DAILY (PLEASE SCHEDULE APPOINTMENT FOR FUTURE REFILLS) 60 tablet 0  . cephALEXin (KEFLEX) 500 MG capsule Take 1 capsule (500 mg total) by mouth 3 (three) times daily. (Patient not taking: Reported on 12/21/2018) 15 capsule 0  . clopidogrel (PLAVIX) 75 MG tablet Take 75 mg by mouth daily.    . cyclobenzaprine (FLEXERIL) 10 MG tablet Take 1 tablet (10 mg total) by mouth 3 (three) times daily as needed for muscle spasms. 30 tablet 1  . glipiZIDE (GLUCOTROL XL) 5 MG 24 hr tablet Take 5 mg by mouth daily. with food    . ibuprofen (ADVIL) 200 MG tablet Take 800 mg by mouth daily as needed for moderate pain.    [name]  lisinopril (ZESTRIL) 10 MG tablet Take 1 tablet (10 mg total) by mouth daily. Please make overdue appt with Dr. Graciela Husbands before anymore refills. Thank you Final Attempt 15 tablet 0  . nitroGLYCERIN (NITROSTAT) 0.4 MG SL tablet Place 1 tablet (0.4 mg total) under the tongue every 5 (five) minutes as needed. For chest pain 25 tablet 3  . ondansetron (ZOFRAN) 4 MG tablet Take 1 tablet (4 mg total) by mouth every 8 (eight) hours as needed for nausea or vomiting. 10 tablet 0  . oxyCODONE-acetaminophen (PERCOCET) 5-325 MG tablet  Take 1 tablet by mouth every 4 (four) hours as needed (max 6 q). 20 tablet 0  . oxymetazoline (AFRIN) 0.05 % nasal spray Place 1 spray into both nostrils 2 (two) times daily.     . pantoprazole (PROTONIX) 40 MG tablet TAKE 1 TABLET EVERY EVENING (PLEASE SCHEDULE APPOINTMENT FOR FUTURE REFILLS) 30 tablet 0   No current facility-administered medications on file prior to visit.    No Known Allergies  Past Medical History:  Diagnosis Date  . Abnormal echocardiogram    a. 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  [name] AICD (automatic cardioverter/defibrillator) present 11/2009  . Arthritis   . AUTOMATIC IMPLANTABLE CARDIAC DEFIBRILLATOR SITU 03/03/2010   Qualifier: Diagnosis of  By: Graciela Husbands, MD, Ruthann Cancer Ty Hilts   . CAD (coronary artery disease)    s/p prior LAD, LCx and RCA stenting remotely for an anterior MI, recurrent MI in 2010 with LAD stent occlusion s/p thrombectomy and PTCA, STEMI 12/2011 s/p DES-prox LAD  . Cardiomyopathy, ischemic 12/14/2011  . Chronic systolic CHF (congestive heart failure) (HCC)   . Headache    migraines  . HTN (hypertension)   . Hyperlipidemia   . ICD (implantable cardiac defibrillator) in place   . Ischemic cardiomyopathy    EF 25-30% s/p single-chamber Medtronic ICD   . NSVT (nonsustained ventricular tachycardia) (HCC) 12/14/2011  . Obesity   . STEMI (ST elevation myocardial infarction) (HCC) 12/12/2011  . Tobacco abuse   . TOBACCO USER 10/29/2009   Qualifier: Diagnosis of  By: Graciela Husbands, MD, Susie Cassette   . Tooth pain 12/14/2011  . VT (ventricular tachycardia) (HCC) 10/18/2012    Past Surgical History:  Procedure Laterality Date  . CARDIAC CATHETERIZATION  03/31/2009  . CARDIAC CATHETERIZATION  05/08/2008   CARDIAC SIZE AND SILHOUETTE NORMAL. THERE WAS ANTERIOR APICAL HYPOKINESIS WITH EF 35-40%  . CARDIAC CATHETERIZATION  03/14/2018  . CARDIAC  DEFIBRILLATOR PLACEMENT  11/2009  . CORONARY ANGIOPLASTY  07/19/2016   BALLOON ANGIOPLASTY TO THE LAD WITH THROMBUS EXTRACTION OF THE PREVIOUSLY STENTED SEGMENT AND NEW STENT PLACEMENT TO THE LEFT CIRCUMFLEX. EF IS DOWN IN THE 30% RANGE  . CORONARY ANGIOPLASTY WITH STENT PLACEMENT  12/12/2011   30% mid LCx, mid RCA & mid LCx stents patent. LVEF 25-30%, mid-distal anterolateral wall AK, apex dilated, dyskinetic s/p DES-prox LAD stenosis.  . CORONARY BALLOON ANGIOPLASTY N/A 07/19/2016   Procedure: Coronary Balloon Angioplasty;  Surgeon: Swaziland, Constantine Ruddick M, MD;  Location: [facility] INVASIVE CV LAB;  Service: Cardiovascular;  Laterality: N/A;  . defib    . HAND SURGERY  1994   right hand  . ICD GENERATOR CHANGEOUT N/A 05/19/2018   Procedure: ICD GENERATOR CHANGEOUT;  Surgeon: Regan Lemming, MD;  Location: [facility] INVASIVE CV LAB;  Service: Cardiovascular;  Laterality: N/A;  . LEFT HEART CATH Bilateral 12/12/2011   Procedure: LEFT HEART CATH;  Surgeon: Kasidy Gianino M [place]aziland, MD;  Location: [facility] CATH LAB;  Service: Cardiovascular;  Laterality: Bilateral;  . LEFT HEART CATH AND CORONARY ANGIOGRAPHY N/A 07/19/2016   Procedure: Left Heart Cath and Coronary Angiography;  Surgeon: Swaziland, Egan Sahlin M, MD;  Location: [facility] INVASIVE CV LAB;  Service: Cardiovascular;  Laterality: N/A;  . LEFT HEART CATH AND CORONARY ANGIOGRAPHY N/A 03/14/2018   Procedure: LEFT HEART CATH AND CORONARY ANGIOGRAPHY;  Surgeon: Swaziland, Lulie Hurd M, MD;  Location: [facility] INVASIVE CV LAB;  Service: Cardiovascular;  Laterality: N/A;    Social History   Tobacco Use  Smoking Status Current Every Day Smoker  . Packs/day: 1.00  . Years: 24.00  . Pack years: 24.00  . Types: E-cigarettes  Smokeless Tobacco Never Used    Social History   Substance and Sexual Activity  Alcohol Use Yes   Comment: Occassionally.    Family History  Problem Relation Age of Onset  . Heart disease Father   . Cancer Mother     Review of Systems: The review of systems is per the HPI.   All other systems were reviewed and are negative.  Physical Exam: There were no vitals taken for this visit. Patient is an obese WM in no acute distress.   Skin is warm and dry. Color is normal.  HEENT is unremarkable. Normocephalic/atraumatic. PERRL. Sclera are nonicteric. Neck is supple. No masses. No JVD. Lungs are clear. Cardiac exam shows a regular rate and rhythm.normal S1-2 without gallop or murmur. Abdomen is soft. Extremities are without edema. Radial cath site without hematoma. Pulses are 2+ and equal.  Gait and ROM are intact. No gross neurologic deficits noted.   LABORATORY DATA:  Lab Results  Component Value Date   WBC 5.0 05/01/2020   HGB 17.1 (H) 05/01/2020   HCT 48.9 05/01/2020   PLT 170 05/01/2020   GLUCOSE 140 (H) 05/01/2020   CHOL 228 (H) 07/13/2016   TRIG 559 (HH) 07/13/2016   HDL 23 (L) 07/13/2016   LDLCALC Comment 07/13/2016   ALT 47 (H) 05/01/2020   AST 34 05/01/2020   NA 138 05/01/2020   K 3.8 05/01/2020   CL 101 05/01/2020   CREATININE 0.84 05/01/2020   BUN 20 05/01/2020   CO2 27 05/01/2020   TSH 0.633 12/12/2011   INR 1.0 07/13/2016   HGBA1C 8.1 (H) 12/19/2018   Lab Results  Component Value Date   CKTOTAL 209 10/19/2011   CKMB 1.8 10/19/2011   TROPONINI <0.30 09/15/2013   Ecg today shows NSR with rate 67. Old anterior infarct. No acute change. I have personally reviewed and interpreted this study.  ICD interrogation today shows battery voltage of 2.9 volts. 6 episodes of NSVT. No device therapies.   Cardiac cath 07/19/16: Conclusion     Prox RCA to Mid RCA lesion, 0 %stenosed.  Prox Cx to Mid Cx lesion, 0 %stenosed.  Prox Cx lesion, 30 %stenosed.  Prox LAD to Mid LAD lesion, 0 %stenosed.  Dist LAD lesion, 60 %stenosed.  The left ventricular ejection fraction is 25-35% by visual estimate.  There is severe left ventricular systolic dysfunction.  LV end diastolic pressure is mildly elevated.  Mid RCA lesion, 95 %stenosed.  Post  intervention, there is a 0% residual stenosis.   1. Single vessel obstructive CAD with focal instent restenosis in the mid RCA 2. Severe LV dysfunction. EF 30-35%. 3. Mildly elevated LVEDP  Plan: Continue DAPT for one year (due to increased CV risk and USAP). Patient is a candidate for same  day discharge. Aggressive risk factor modification.     Cardiac cath 03/14/18:  LEFT HEART CATH AND CORONARY ANGIOGRAPHY    Conclusion    Dist LAD lesion is 85% stenosed.  Prox Cx lesion is 30% stenosed.  Balloon angioplasty was performed.  Previously placed Prox RCA to Mid RCA stent (unknown type) is widely patent.  Previously placed Mid RCA stent (unknown type) is widely patent.  Previously placed Prox Cx to Mid Cx stent (unknown type) is widely patent.  Previously placed Prox LAD to Mid LAD stent (unknown type) is widely patent.  There is severe left ventricular systolic dysfunction.  LV end diastolic pressure is normal.  The left ventricular ejection fraction is 25-35% by visual estimate.   1. Single vessel obstructive CAD involving the very distal LAD at the apex. All prior stents are widely patent.  2. Severe LV dysfunction. EF estimated at 25-30% . 3. Normal LVEDP  Plan: optimize CHF therapy. Will switch metoprolol to Coreg 12.5 mg bid. Further titration of meds as outpatient.    Diagnostic Dominance: Right    Intervention    Assessment / Plan:  1. CAD with extensive history of CAD with multiple PCIs including LAD, LCx, RCA. S/p  STEMI/repeat DES to the LAD in Dec 2013. No ischemia noted on Myoview study in 2014. Extensive anterior apical scar. Presented July 2018 with progressive unstable angina. Found to have severe in stent restenosis in mid RCA treated with cutting balloon angioplasty. Last cardiac cath in March 2020 showed patent stents with severe distal LAD disease.   2. Ischemic CM - EF is 30-35 %. No overt CHF.  Continue Toprol XL and lisinopril. Follow  up in 3 months and consider adding aldactone at that time. Will repeat Echo once medications optimized.   3. HLD-  Resume lipitor 80 mg daily. Discussed heart healthy diet with efforts at weight loss.  4. Ongoing tobacco abuse - Has failed Chantix and Wellbutrin in the past. He reports he is trying to quit.  5. Ventricular tachycardia. No sustained recurrence. Will resume beta blocker therapy. ICD followed in device clinic.    6. DM type 2- per Dr. Selena BattenKim  7. Noncompliance. Discussed importance of medical therapy to reduce future CV events or CHF progression. Motivation is better but still needs reinforcement.

## 2020-06-30 ENCOUNTER — Ambulatory Visit: Payer: Medicare HMO | Admitting: Cardiology

## 2020-07-16 ENCOUNTER — Ambulatory Visit (INDEPENDENT_AMBULATORY_CARE_PROVIDER_SITE_OTHER): Payer: Medicare HMO

## 2020-07-16 DIAGNOSIS — I255 Ischemic cardiomyopathy: Secondary | ICD-10-CM

## 2020-07-17 LAB — CUP PACEART REMOTE DEVICE CHECK
Battery Remaining Longevity: 122 mo
Battery Voltage: 3.01 V
Brady Statistic RV Percent Paced: 0.01 %
Date Time Interrogation Session: 20220706045331
HighPow Impedance: 55 Ohm
HighPow Impedance: 81 Ohm
Implantable Lead Implant Date: 20111102
Implantable Lead Location: 753860
Implantable Lead Model: 6947
Implantable Pulse Generator Implant Date: 20200508
Lead Channel Impedance Value: 342 Ohm
Lead Channel Impedance Value: 437 Ohm
Lead Channel Pacing Threshold Amplitude: 1 V
Lead Channel Pacing Threshold Pulse Width: 0.4 ms
Lead Channel Sensing Intrinsic Amplitude: 19.5 mV
Lead Channel Sensing Intrinsic Amplitude: 19.5 mV
Lead Channel Setting Pacing Amplitude: 2.5 V
Lead Channel Setting Pacing Pulse Width: 0.4 ms
Lead Channel Setting Sensing Sensitivity: 0.3 mV

## 2020-08-07 NOTE — Progress Notes (Signed)
Remote ICD transmission.   

## 2020-08-22 ENCOUNTER — Telehealth: Payer: Self-pay

## 2020-08-22 NOTE — Telephone Encounter (Signed)
Carelink alert received for AT/AF burden 3.1%, no OAC on file. Longest episode 1 hour 40 minutes. No hx of AF or AFL in Epic.  Routing to Dr. Ladona Ridgel for recommendations.

## 2020-08-25 NOTE — Telephone Encounter (Signed)
Duration< 2 hrs;  would consider this SCAF and not anticoagulate

## 2020-08-27 NOTE — Telephone Encounter (Signed)
Awaiting response from Dr. Graciela Husbands.

## 2020-09-01 ENCOUNTER — Telehealth: Payer: Self-pay | Admitting: Cardiology

## 2020-09-01 NOTE — Telephone Encounter (Signed)
Attempted to call both numbers provided. Was unable to get through or leave a message.  This is a Dr. Graciela Husbands patient.

## 2020-09-01 NOTE — Telephone Encounter (Signed)
Pt c/o of Chest Pain: STAT if CP now or developed within 24 hours  1. Are you having CP right now?  No pt have not had CP in over a  week  2. Are you experiencing any other symptoms (ex. SOB, nausea, vomiting, sweating)?  Sweating only  3. How long have you been experiencing CP?   4. Is your CP continuous or coming and going? Coming and going  5. Have you taken Nitroglycerin?  Pt is currently out of Nitro for a couple of weeks. Pt is calling because he have been having to chew more aspirin than normal and he needs to make sure he is good before leaving for [place]. I offered pt an appt but his wife refused the appt ?

## 2020-09-02 NOTE — Telephone Encounter (Signed)
Lm to call back ./cy 

## 2020-09-04 NOTE — Telephone Encounter (Signed)
Please arrange OV or telehealth visit  to discuss   we have lots of cooks in the [employer] [facility]

## 2020-09-08 NOTE — Telephone Encounter (Signed)
Spoke with pt and has had a couple of episodes of chest pain when it has been real hot outside also had experienced sweating and blurry vision   Per pt is wanting to be seen before [place] trip in Oct  is suppose to leave the 7th  Per pt has taken and chewed a couple of baby asa and pain resolved Appt made with  Gaynelle Adu NP Encouraged pt if has worsening episodes to go the ED for eval and treatment Pt verbalized understanding ./cy

## 2020-09-26 NOTE — Telephone Encounter (Signed)
Pt scheduled to see Dr Graciela Husbands 10/07/2020 in [place] office.

## 2020-10-07 ENCOUNTER — Other Ambulatory Visit: Payer: Self-pay

## 2020-10-07 ENCOUNTER — Encounter: Payer: Self-pay | Admitting: Internal Medicine

## 2020-10-07 ENCOUNTER — Ambulatory Visit: Payer: Medicare HMO | Admitting: Internal Medicine

## 2020-10-07 VITALS — BP 130/80 | HR 68 | Ht 73.0 in | Wt 244.0 lb

## 2020-10-07 DIAGNOSIS — I472 Ventricular tachycardia, unspecified: Secondary | ICD-10-CM

## 2020-10-07 DIAGNOSIS — I5022 Chronic systolic (congestive) heart failure: Secondary | ICD-10-CM

## 2020-10-07 DIAGNOSIS — I255 Ischemic cardiomyopathy: Secondary | ICD-10-CM | POA: Diagnosis not present

## 2020-10-07 DIAGNOSIS — Z95810 Presence of automatic (implantable) cardiac defibrillator: Secondary | ICD-10-CM

## 2020-10-07 DIAGNOSIS — Z79899 Other long term (current) drug therapy: Secondary | ICD-10-CM | POA: Diagnosis not present

## 2020-10-07 LAB — PACEMAKER DEVICE OBSERVATION

## 2020-10-07 MED ORDER — NITROGLYCERIN 0.4 MG SL SUBL: 0.4000 mg | SUBLINGUAL_TABLET | SUBLINGUAL | 3 refills | Status: AC | PRN

## 2020-10-07 MED ORDER — LISINOPRIL 10 MG PO TABS: 10.0000 mg | ORAL_TABLET | Freq: Every day | ORAL | 1 refills | Status: DC

## 2020-10-07 MED ORDER — ATORVASTATIN CALCIUM 80 MG PO TABS: ORAL_TABLET | ORAL | 1 refills | Status: DC

## 2020-10-07 MED ORDER — PANTOPRAZOLE SODIUM 40 MG PO TBEC: DELAYED_RELEASE_TABLET | ORAL | 1 refills | Status: DC

## 2020-10-07 MED ORDER — CARVEDILOL 12.5 MG PO TABS: ORAL_TABLET | ORAL | 1 refills | Status: DC

## 2020-10-07 MED ORDER — SPIRONOLACTONE 25 MG PO TABS: 12.5000 mg | ORAL_TABLET | Freq: Every day | ORAL | 1 refills | Status: DC

## 2020-10-07 MED ORDER — CLOPIDOGREL BISULFATE 75 MG PO TABS: 75.0000 mg | ORAL_TABLET | Freq: Every day | ORAL | 1 refills | Status: DC

## 2020-10-07 NOTE — Patient Instructions (Signed)
Medication Instructions:  - Your physician has recommended you make the following change in your medication:   1) START aldactone (spironolactone) 25 mg- take 0.5 tablet (12.5 mg) by  mouth once daily   *If you need a refill on your cardiac medications before your next appointment, please call your pharmacy*   Lab Work: - Your physician recommends that you return for lab work: the 1st week of November- BMP  If you have labs (blood work) drawn today and your tests are completely normal, you will receive your results only by: [employer] (if you have MyChart) OR A paper copy in the mail If you have any lab test that is abnormal or we need to change your treatment, we will call you to review the results.   Testing/Procedures: - none ordered   Follow-Up: At [facility], you and your health needs are our priority.  As part of our continuing mission to provide you with exceptional heart care, we have created designated Provider Care Teams.  These Care Teams include your primary Cardiologist (physician) and Advanced Practice Providers (APPs -  Physician Assistants and Nurse Practitioners) who all work together to provide you with the care you need, when you need it.  We recommend signing up for the patient portal called "MyChart".  Sign up information is provided on this After Visit Summary.  MyChart is used to connect with patients for Virtual Visits (Telemedicine).  Patients are able to view lab/test results, encounter notes, upcoming appointments, etc.  Non-urgent messages can be sent to your provider as well.   To learn more about what you can do with MyChart, go to [email or web address].    Your next appointment:   1) In November/ December with one of Dr. Elvis Coil APP [facility] office) - for medication adjustment  2) In 1 year with Dr. Graciela Husbands  The format for your next appointment:   In Person  Provider:   As above   Other Instructions  Spironolactone Tablets What is  this medication? SPIRONOLACTONE (speer on oh LAK tone) treats high blood pressure and heart failure. It may also be used to reduce swelling related to heart, kidney, or liver disease. It helps your kidneys remove more fluid and salt from your blood through the urine without losing too much potassium. It belongs to a group of medications called diuretics. This medicine may be used for other purposes; ask your health care provider or pharmacist if you have questions. COMMON BRAND NAME(S): Aldactone What should I tell my care team before I take this medication? They need to know if you have any of these conditions: Addison's disease or low adrenal gland function High blood level of potassium Kidney disease Liver disease An unusual or allergic reaction to spironolactone, other medications, foods, dyes, or preservatives Pregnant or trying to get pregnant Breast-feeding How should I use this medication? Take this medication by mouth. Take it as directed on the prescription label at the same time every day. You can take it with or without food. You should always take it the same way. Keep taking it unless your care team tells you to stop. Talk to your care team about the use of this medication in children. Special care may be needed. Overdosage: If you think you have taken too much of this medicine contact a poison control center or emergency room at once. NOTE: This medicine is only for you. Do not share this medicine with others. What if I miss a dose? If you miss a dose,  take it as soon as you can. If it is almost time for your next dose, take only that dose. Do not take double or extra doses. What may interact with this medication? Do not take this medication with any of the following: Cidofovir Eplerenone Tranylcypromine This medication may also interact with the following: Aspirin Certain medications for blood pressure or heart disease like benazepril, lisinopril, losartan,  valsartan Certain medications that treat or prevent blood clots like heparin and enoxaparin Cholestyramine Cyclosporine Digoxin Lithium Medications that relax muscles for surgery NSAIDs, medications for pain and inflammation, like ibuprofen or naproxen Other diuretics Potassium salts or supplements Steroid medications like prednisone or cortisone Trimethoprim This list may not describe all possible interactions. Give your health care provider a list of all the medicines, herbs, non-prescription drugs, or dietary supplements you use. Also tell them if you smoke, drink alcohol, or use illegal drugs. Some items may interact with your medicine. What should I watch for while using this medication? Visit your care team for regular checks on your progress. Check your blood pressure as directed. Ask your care team what your blood pressure should be. Also, find out when you should contact him or her. Do not treat yourself for coughs, colds, or pain while you are using this medication without asking your care team for advice. Some medications may increase your blood pressure. Check with your care team if you have severe diarrhea, nausea, and vomiting, or if you sweat a lot. The loss of too much body fluid may make it dangerous for you to take this medication. You may need to be on a special diet while taking this medication. Ask your care team. Also, find out how many glasses of fluid you need to drink each day. You may get drowsy or dizzy. Do not drive, use machinery, or do anything that needs mental alertness until you know how this medication affects you. Do not stand or sit up quickly, especially if you are an older patient. This reduces the risk of dizzy or fainting spells. Alcohol may interfere with the effects of this medication. Avoid alcoholic drinks. Avoid salt substitutes unless you are told otherwise by your care team. What side effects may I notice from receiving this medication? Side effects  that you should report to your care team as soon as possible: Allergic reactions-skin rash, itching, hives, swelling of the face, lips, tongue, or throat Dehydration-increased thirst, dry mouth, feeling faint or lightheaded, headache, dark yellow or brown urine High potassium level-muscle weakness, fast or irregular heartbeat Kidney injury-decrease in the amount of urine, swelling of the ankles, hands, or feet Low blood pressure-dizziness, feeling faint or lightheaded, blurry vision Low sodium level-muscle weakness, fatigue, dizziness, headache, confusion Side effects that usually do not require medical attention (report to your care team if they continue or are bothersome): Breast pain or tenderness Changes in sex drive or performance Dizziness Headache Irregular menstrual cycles or spotting Unexpected breast tissue growth This list may not describe all possible side effects. Call your doctor for medical advice about side effects. You may report side effects to FDA at 1-800-FDA-1088. Where should I keep my medication? Keep out of the reach of children and pets. Store below 25 degrees C (77 degrees F). Get rid of any unused medication after the expiration date. To get rid of medications that are no longer needed or have expired: Take the medication to a medication take-back program. Check with your pharmacy or law enforcement to find a location. If you  cannot return the medication, check the label or package insert to see if the medication should be thrown out in the garbage or flushed down the toilet. If you are not sure, ask your care team. If it is safe to put into the trash, take the medication out of the container. Mix the medication with cat litter, dirt, coffee grounds, or other unwanted substance. Seal the mixture in a bag or container. Put it in the trash. NOTE: This sheet is a summary. It may not cover all possible information. If you have questions about this medicine, talk to your  doctor, pharmacist, or health care provider.  2022 Elsevier/Gold Standard (2020-03-27 13:01:04)

## 2020-10-07 NOTE — Progress Notes (Signed)
Patient Care Team: Pearson Grippe, MD as PCP - General (Internal Medicine) [place], Peter M, MD as PCP - Cardiology (Cardiology) Duke Salvia, MD as PCP - Electrophysiology (Cardiology)   HPI  Jonathan Fischer is a 50 y.o. male Seen in followup for an ICD implanted 2011 for primary prevention in the setting of complex coronary disease . History of appropriate therapy for ventricular tachycardia with ICD discharge.  The rhythm strip was reviewed it was a monomorphic tachycardia   ICD generator replacement by Dr. Derek Jack 5/20 as I was out of hospital because of COVID.  Postprocedure shoulder pain with negative imaging consistent with adhesive capsulitis.  Have not seen the patient since 7/20 no more shoulder pain but some stiffness  Denies chest pain shortness of breath peripheral edema, no palpitations or syncope   DATE TEST EF   12/13 LHC 25-30% LAD t->>stent Cx-S, RCA-S patent  10/14 Myoview 36% Large anterior scar  7/18 LHC 25-30% mRCA 95% med rx  3/20 LHC 25-30% LAD stent; CXp stent RCA x 2 stent>>all patent LADd-85%;    Date Cr K Hgb  7/18 0./83 4.6 17.5  4/22  0.84 3.8 17.1      Past Medical History:  Diagnosis Date   Abnormal echocardiogram    a. 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   AICD (automatic cardioverter/defibrillator) present 11/2009   Arthritis    AUTOMATIC IMPLANTABLE CARDIAC DEFIBRILLATOR SITU 03/03/2010   Qualifier: Diagnosis of  By: Graciela Husbands, MD, [facility], Ty Hilts    CAD (coronary artery disease)    s/p prior LAD, LCx and RCA stenting remotely for an anterior MI, recurrent MI in 2010 with LAD stent occlusion s/p thrombectomy and PTCA, STEMI 12/2011 s/p DES-prox LAD   Cardiomyopathy, ischemic 12/14/2011   Chronic systolic CHF (congestive heart failure) (HCC)    Headache    migraines   HTN (hypertension)    Hyperlipidemia    ICD (implantable  cardiac defibrillator) in place    Ischemic cardiomyopathy    EF 25-30% s/p single-chamber Medtronic ICD    NSVT (nonsustained ventricular tachycardia) (HCC) 12/14/2011   Obesity    STEMI (ST elevation myocardial infarction) (HCC) 12/12/2011   Tobacco abuse    TOBACCO USER 10/29/2009   Qualifier: Diagnosis of  By: Graciela Husbands, MD, Susie Cassette    Tooth pain 12/14/2011   VT (ventricular tachycardia) (HCC) 10/18/2012    Past Surgical History:  Procedure Laterality Date   CARDIAC CATHETERIZATION  03/31/2009   CARDIAC CATHETERIZATION  05/08/2008   CARDIAC SIZE AND SILHOUETTE NORMAL. THERE WAS ANTERIOR APICAL HYPOKINESIS WITH EF 35-40%   CARDIAC CATHETERIZATION  03/14/2018   CARDIAC DEFIBRILLATOR PLACEMENT  11/2009   CORONARY ANGIOPLASTY  07/19/2016   BALLOON ANGIOPLASTY TO THE LAD WITH THROMBUS EXTRACTION OF THE PREVIOUSLY STENTED SEGMENT AND NEW STENT PLACEMENT TO THE LEFT CIRCUMFLEX. EF IS DOWN IN THE 30% RANGE   CORONARY ANGIOPLASTY WITH STENT PLACEMENT  12/12/2011   30% mid LCx, mid RCA & mid LCx stents patent. LVEF 25-30%, mid-distal anterolateral wall AK, apex dilated, dyskinetic s/p DES-prox LAD stenosis.   CORONARY BALLOON ANGIOPLASTY N/A 07/19/2016   Procedure: Coronary Balloon Angioplasty;  Surgeon: Swaziland, Peter M, MD;  Location: [facility] INVASIVE CV LAB;  Service: Cardiovascular;  Laterality: N/A;   defib     HAND SURGERY  1994   right hand   ICD GENERATOR CHANGEOUT N/A  05/19/2018   Procedure: ICD GENERATOR CHANGEOUT;  Surgeon: Regan Lemming, MD;  Location: [facility] INVASIVE CV LAB;  Service: Cardiovascular;  Laterality: N/A;   LEFT HEART CATH Bilateral 12/12/2011   Procedure: LEFT HEART CATH;  Surgeon: Peter M Swaziland, MD;  Location: [facility] CATH LAB;  Service: Cardiovascular;  Laterality: Bilateral;   LEFT HEART CATH AND CORONARY ANGIOGRAPHY N/A 07/19/2016   Procedure: Left Heart Cath and Coronary Angiography;  Surgeon: Swaziland, Peter M, MD;  Location: [facility] INVASIVE CV LAB;  Service:  Cardiovascular;  Laterality: N/A;   LEFT HEART CATH AND CORONARY ANGIOGRAPHY N/A 03/14/2018   Procedure: LEFT HEART CATH AND CORONARY ANGIOGRAPHY;  Surgeon: Swaziland, Peter M, MD;  Location: [facility] INVASIVE CV LAB;  Service: Cardiovascular;  Laterality: N/A;    Current Outpatient Medications  Medication Sig Dispense Refill   aspirin EC 81 MG EC tablet Take 1 tablet (81 mg total) by mouth daily. 30 tablet 3   atorvastatin (LIPITOR) 80 MG tablet TAKE 1 TABLET EVERY DAY AT 6PM (NEED MD APPOINTMENT FOR REFILLS) 15 tablet 0   carvedilol (COREG) 12.5 MG tablet TAKE 1 TABLET TWICE DAILY (PLEASE SCHEDULE APPOINTMENT FOR FUTURE REFILLS) 60 tablet 0   clopidogrel (PLAVIX) 75 MG tablet Take 75 mg by mouth daily.     glipiZIDE (GLUCOTROL XL) 5 MG 24 hr tablet Take 5 mg by mouth daily. with food     ibuprofen (ADVIL) 200 MG tablet Take 800 mg by mouth daily as needed for moderate pain.     insulin degludec (TRESIBA FLEXTOUCH) 100 UNIT/ML FlexTouch Pen 36 units     lisinopril (ZESTRIL) 10 MG tablet Take 1 tablet (10 mg total) by mouth daily. Please make overdue appt with Dr. Graciela Husbands before anymore refills. Thank you Final Attempt 15 tablet 0   nitroGLYCERIN (NITROSTAT) 0.4 MG SL tablet Place 1 tablet (0.4 mg total) under the tongue every 5 (five) minutes as needed. For chest pain 25 tablet 3   oxymetazoline (AFRIN) 0.05 % nasal spray Place 1 spray into both nostrils 2 (two) times daily.     pantoprazole (PROTONIX) 40 MG tablet TAKE 1 TABLET EVERY EVENING (PLEASE SCHEDULE APPOINTMENT FOR FUTURE REFILLS) 30 tablet 0   cephALEXin (KEFLEX) 500 MG capsule Take 1 capsule (500 mg total) by mouth 3 (three) times daily. (Patient not taking: No sig reported) 15 capsule 0   ezetimibe (ZETIA) 10 MG tablet  (Patient not taking: Reported on 10/07/2020)     No current facility-administered medications for this visit.    No Known Allergies  Review of Systems negative except from HPI and PMH  Physical Exam BP 130/80 (BP  Location: Left Arm, Patient Position: Sitting, Cuff Size: Normal)   Pulse 68   Ht 6\' 1"  (1.854 m)   Wt 244 lb (110.7 kg)   SpO2 96%   BMI 32.19 kg/m  Well developed and well nourished in no acute distress HENT normal Neck supple with JVP-flat Clear Device pocket well healed; without hematoma or erythema.  There is no tethering  Regular rate and rhythm, no gallop No / murmur Abd-soft with active BS No Clubbing cyanosis  edema Skin-warm and dry A & Oriented  Grossly normal sensory and motor function  ECG sinus at 68 Interval 14/09/41   Assessment and  Plan  Ventricular tachycardia    Ischemic cardiomyopathy    Congestive heart failure chronic systolic   TYobacco abuse__ still smoking  Not rady to pull the stopping trigger  Implantable defibrillator-Medtronic    VT-nonsustained-intercurrent-symptomatic  No intercurrent ventricular tachycardia.  We will continue his carvedilol at 12.5 although I would like to think about uptitrating this.  With his cardiomyopathy, we will continue him on his lisinopril, Entresto previously have not been unaffordable.  We will add spironolactone which he previously was able to afford and which was not associated with hyperkalemia.  He is about to go to [place] who will resume it when he returns at 12.5 mg a day.  We will arrange for metabolic profile 2 weeks later and also arrange for follow-up with his primary cardiologist about 2 months later to consider further up titration of his carvedilol  Euvolemic.  Continue DAPT for his complex coronary disease.  He is also on atorvastatin will continue at 80 mg

## 2020-10-09 ENCOUNTER — Ambulatory Visit (HOSPITAL_BASED_OUTPATIENT_CLINIC_OR_DEPARTMENT_OTHER): Payer: Medicare HMO | Admitting: Family

## 2020-10-31 ENCOUNTER — Ambulatory Visit (INDEPENDENT_AMBULATORY_CARE_PROVIDER_SITE_OTHER): Payer: Medicare HMO

## 2020-10-31 DIAGNOSIS — I255 Ischemic cardiomyopathy: Secondary | ICD-10-CM | POA: Diagnosis not present

## 2020-11-03 LAB — CUP PACEART REMOTE DEVICE CHECK
Battery Remaining Longevity: 120 mo
Battery Voltage: 3.01 V
Brady Statistic RV Percent Paced: 0.01 %
Date Time Interrogation Session: 20221021004402
HighPow Impedance: 55 Ohm
HighPow Impedance: 87 Ohm
Implantable Lead Implant Date: 20111102
Implantable Lead Location: 753860
Implantable Lead Model: 6947
Implantable Pulse Generator Implant Date: 20200508
Lead Channel Impedance Value: 342 Ohm
Lead Channel Impedance Value: 437 Ohm
Lead Channel Pacing Threshold Amplitude: 1.125 V
Lead Channel Pacing Threshold Pulse Width: 0.4 ms
Lead Channel Sensing Intrinsic Amplitude: 19 mV
Lead Channel Sensing Intrinsic Amplitude: 19 mV
Lead Channel Setting Pacing Amplitude: 2.5 V
Lead Channel Setting Pacing Pulse Width: 0.4 ms
Lead Channel Setting Sensing Sensitivity: 0.3 mV

## 2020-11-10 NOTE — Progress Notes (Signed)
Remote ICD transmission.   

## 2020-12-08 ENCOUNTER — Other Ambulatory Visit: Payer: Medicare HMO

## 2020-12-15 ENCOUNTER — Ambulatory Visit: Payer: Medicare HMO | Admitting: Medical

## 2020-12-15 ENCOUNTER — Encounter: Payer: Self-pay | Admitting: Medical

## 2020-12-15 ENCOUNTER — Other Ambulatory Visit: Payer: Self-pay

## 2020-12-15 VITALS — BP 120/76 | HR 63 | Ht 73.0 in | Wt 248.0 lb

## 2020-12-15 DIAGNOSIS — I5022 Chronic systolic (congestive) heart failure: Secondary | ICD-10-CM | POA: Diagnosis not present

## 2020-12-15 DIAGNOSIS — I2510 Atherosclerotic heart disease of native coronary artery without angina pectoris: Secondary | ICD-10-CM | POA: Diagnosis not present

## 2020-12-15 DIAGNOSIS — I472 Ventricular tachycardia, unspecified: Secondary | ICD-10-CM | POA: Diagnosis not present

## 2020-12-15 DIAGNOSIS — Z95810 Presence of automatic (implantable) cardiac defibrillator: Secondary | ICD-10-CM | POA: Diagnosis not present

## 2020-12-15 DIAGNOSIS — I255 Ischemic cardiomyopathy: Secondary | ICD-10-CM | POA: Diagnosis not present

## 2020-12-15 NOTE — Progress Notes (Signed)
Cardiology Office Note:    Date:  12/15/2020   ID:  Jonathan Fischer, DOB September 25, 1970, MRN [id number]  PCP:  Pearson Grippe, MD  [facility] Cardiologist:  Peter Swaziland, MD  [facility] Electrophysiologist:  Sherryl Manges, MD   Referring MD: Pearson Grippe, MD   Chief Complaint: 2 month follow-up  History of Present Illness:    Jonathan Fischer is a 50 y.o. male with a hx of CAD s/p prior stenting, ICM, HFrEF, VT, s/p ICD implantation, HTN, HLD who presents for follow-up.   He reports his first stent(s) was 10 years ago at [facility]. He returned 6 months later and had another stent placed. He had a  STEMI in December 2013 with occluded proximal LAD treated with repeat DES of the proximal LAD (occluded at the site of the prior stent). Stent in the LCX was patent. Stent in the mid RCA was patent. EF 25 to 30%. Troponin over 20. Echo shoed EF of 25 to 30% as well, septal apical and anterior wall AK and possible small layer of mural apical thrombus without mobility. Not felt to be a candidate for further anticoagulation due to his noncompliance.    He was readmitted in October 2014 with an ICD discharge. Interrogation confirmed this was ventricular tachycardia. He had normal cardiac enzymes. Potassium was mildly decreased. He had no evidence of increased congestive heart failure. We recommended continued medical therapy with beta blocker and potassium repletion. Echo showed ejection fraction was in the range of 30% to 35%. Akinesis of the mid-distalanteroseptal myocardium. Myoview showed a large anteroseptal scar without ischemia. ejection fraction was in the range of 30% to 35%. Akinesis of the mid-distalanteroseptal myocardium.  ICD interrogation and followup showed no recurrent episodes of ventricular tachycardia. His last remote ICD transmission was in August 2016.    Was seen in 2018 with symptoms of unstable angina. Was off all medication for 2 years. Underwent cardiac cath that showed  focal in stent restenosis in RCA treated successfully with cutting balloon angioplasty. Medication resumed including ASA, plavix, lisinopril, lipitor, and Toprol.   Patient seen 02/2018 and reported worsening anginal symptoms and was set up for repeat cardiac catheterization. Repeat cath showed single vessel obstructive CAD in the dLAD at the apex with all prior stents widely patent, EF 25-30%, normal LVEDP.   He was last seen 10/07/20 and had appropriate therapy for VT with ICD discharge. Started on spironolactone.    Today, the patient reports palpitations. They can occur any time, not necessarily after exertion. No chest pain or shortness of breath. Needs labs today. No LLE, orthopnea, pnd. No other issues. BP good, HR 63bpm.    Past Medical History:  Diagnosis Date   Abnormal echocardiogram    a. 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   AICD (automatic cardioverter/defibrillator) present 11/2009   Arthritis    AUTOMATIC IMPLANTABLE CARDIAC DEFIBRILLATOR SITU 03/03/2010   Qualifier: Diagnosis of  By: Graciela Husbands, MD, [facility], Ty Hilts    CAD (coronary artery disease)    s/p prior LAD, LCx and RCA stenting remotely for an anterior MI, recurrent MI in 2010 with LAD stent occlusion s/p thrombectomy and PTCA, STEMI 12/2011 s/p DES-prox LAD   Cardiomyopathy, ischemic 12/14/2011   Chronic systolic CHF (congestive heart failure) (HCC)    Headache    migraines   HTN (hypertension)    Hyperlipidemia    ICD (  implantable cardiac defibrillator) in place    Ischemic cardiomyopathy    EF 25-30% s/p single-chamber Medtronic ICD    NSVT (nonsustained ventricular tachycardia) 12/14/2011   Obesity    STEMI (ST elevation myocardial infarction) (HCC) 12/12/2011   Tobacco abuse    TOBACCO USER 10/29/2009   Qualifier: Diagnosis of  By: Graciela Husbands, MD, Susie Cassette    Tooth pain 12/14/2011   VT  (ventricular tachycardia) 10/18/2012    Past Surgical History:  Procedure Laterality Date   CARDIAC CATHETERIZATION  03/31/2009   CARDIAC CATHETERIZATION  05/08/2008   CARDIAC SIZE AND SILHOUETTE NORMAL. THERE WAS ANTERIOR APICAL HYPOKINESIS WITH EF 35-40%   CARDIAC CATHETERIZATION  03/14/2018   CARDIAC DEFIBRILLATOR PLACEMENT  11/2009   CORONARY ANGIOPLASTY  07/19/2016   BALLOON ANGIOPLASTY TO THE LAD WITH THROMBUS EXTRACTION OF THE PREVIOUSLY STENTED SEGMENT AND NEW STENT PLACEMENT TO THE LEFT CIRCUMFLEX. EF IS DOWN IN THE 30% RANGE   CORONARY ANGIOPLASTY WITH STENT PLACEMENT  12/12/2011   30% mid LCx, mid RCA & mid LCx stents patent. LVEF 25-30%, mid-distal anterolateral wall AK, apex dilated, dyskinetic s/p DES-prox LAD stenosis.   CORONARY BALLOON ANGIOPLASTY N/A 07/19/2016   Procedure: Coronary Balloon Angioplasty;  Surgeon: Swaziland, Peter M, MD;  Location: [facility] INVASIVE CV LAB;  Service: Cardiovascular;  Laterality: N/A;   defib     HAND SURGERY  1994   right hand   ICD GENERATOR CHANGEOUT N/A 05/19/2018   Procedure: ICD GENERATOR CHANGEOUT;  Surgeon: Regan Lemming, MD;  Location: [facility] INVASIVE CV LAB;  Service: Cardiovascular;  Laterality: N/A;   LEFT HEART CATH Bilateral 12/12/2011   Procedure: LEFT HEART CATH;  Surgeon: Peter M Swaziland, MD;  Location: [facility] CATH LAB;  Service: Cardiovascular;  Laterality: Bilateral;   LEFT HEART CATH AND CORONARY ANGIOGRAPHY N/A 07/19/2016   Procedure: Left Heart Cath and Coronary Angiography;  Surgeon: Swaziland, Peter M, MD;  Location: [facility] INVASIVE CV LAB;  Service: Cardiovascular;  Laterality: N/A;   LEFT HEART CATH AND CORONARY ANGIOGRAPHY N/A 03/14/2018   Procedure: LEFT HEART CATH AND CORONARY ANGIOGRAPHY;  Surgeon: Swaziland, Peter M, MD;  Location: [facility] INVASIVE CV LAB;  Service: Cardiovascular;  Laterality: N/A;    Current Medications: Current Meds  Medication Sig   aspirin EC 81 MG EC tablet Take 1 tablet (81 mg total) by mouth daily.   atorvastatin  (LIPITOR) 80 MG tablet Take 1 tablet (80 mg) by mouth once daily   carvedilol (COREG) 12.5 MG tablet Take 1 tablet (12.5 mg) by mouth twice daily   clopidogrel (PLAVIX) 75 MG tablet Take 1 tablet (75 mg total) by mouth daily.   glipiZIDE (GLUCOTROL XL) 5 MG 24 hr tablet Take 5 mg by mouth daily. with food   ibuprofen (ADVIL) 200 MG tablet Take 800 mg by mouth daily as needed for moderate pain.   insulin degludec (TRESIBA FLEXTOUCH) 100 UNIT/ML FlexTouch Pen Inject into the skin daily.   lisinopril (ZESTRIL) 10 MG tablet Take 1 tablet (10 mg total) by mouth daily.   nitroGLYCERIN (NITROSTAT) 0.4 MG SL tablet Place 1 tablet (0.4 mg total) under the tongue every 5 (five) minutes as needed. For chest pain   oxymetazoline (AFRIN) 0.05 % nasal spray Place 1 spray into both nostrils 2 (two) times daily.   pantoprazole (PROTONIX) 40 MG tablet Take 1 tablet (40 mg) by mouth once daily   spironolactone (ALDACTONE) 25 MG tablet Take 0.5 tablets (12.5 mg total) by mouth daily.     Allergies:  Patient has no known allergies.   Social History   Socioeconomic History   Marital status: Married    Spouse name: Not on file   Number of children: Not on file   Years of education: Not on file   Highest education level: Not on file  Occupational History   Not on file  Tobacco Use   Smoking status: Every Day    Packs/day: 1.00    Years: 24.00    Pack years: 24.00    Types: E-cigarettes, Cigarettes   Smokeless tobacco: Never  Vaping Use   Vaping Use: Never used  Substance and Sexual Activity   Alcohol use: Yes    Comment: Occassionally   Drug use: No   Sexual activity: Yes  Other Topics Concern   Not on file  Social History Narrative   Not on file   Social Determinants of Health   Financial Resource Strain: Not on file  Food Insecurity: Not on file  Transportation Needs: Not on file  Physical Activity: Not on file  Stress: Not on file  Social Connections: Not on file     Family  History: The patient's family history includes Cancer in his mother; Heart disease in his father.  ROS:   Please see the history of present illness.     All other systems reviewed and are negative.  EKGs/Labs/Other Studies Reviewed:    The following studies were reviewed today:  LHC 02/2018 Dist LAD lesion is 85% stenosed. Prox Cx lesion is 30% stenosed. Balloon angioplasty was performed. Previously placed Prox RCA to Mid RCA stent (unknown type) is widely patent. Previously placed Mid RCA stent (unknown type) is widely patent. Previously placed Prox Cx to Mid Cx stent (unknown type) is widely patent. Previously placed Prox LAD to Mid LAD stent (unknown type) is widely patent. There is severe left ventricular systolic dysfunction. LV end diastolic pressure is normal. The left ventricular ejection fraction is 25-35% by visual estimate.   1. Single vessel obstructive CAD involving the very distal LAD at the apex. All prior stents are widely patent.  2. Severe LV dysfunction. EF estimated at 25-30% . 3. Normal LVEDP   Plan: optimize CHF therapy. Will switch metoprolol to Coreg 12.5 mg bid. Further titration of meds as outpatient.    EKG:  EKG is  ordered today.  The ekg ordered today demonstrates NSR, 63bpm, q waves anterolateral leads, nonspecific ST changes  Recent Labs: 05/01/2020: ALT 47; BUN 20; Creatinine, Ser 0.84; Hemoglobin 17.1; Platelets 170; Potassium 3.8; Sodium 138  Recent Lipid Panel    Component Value Date/Time   CHOL 228 (H) 07/13/2016 1005   TRIG 559 (HH) 07/13/2016 1005   HDL 23 (L) 07/13/2016 1005   CHOLHDL 9.9 (H) 07/13/2016 1005   CHOLHDL 7.0 12/13/2011 0500   VLDL 53 (H) 12/13/2011 0500   LDLCALC Comment 07/13/2016 1005     Physical Exam:    VS:  BP 120/76 (BP Location: Left Arm, Patient Position: Sitting, Cuff Size: Large)   Pulse 63   Ht 6\' 1"  (1.854 m)   Wt 248 lb (112.5 kg)   SpO2 98%   BMI 32.72 kg/m     Wt Readings from Last 3  Encounters:  12/15/20 248 lb (112.5 kg)  10/07/20 244 lb (110.7 kg)  04/30/20 235 lb (106.6 kg)     GEN:  Well nourished, well developed in no acute distress HEENT: Normal NECK: No JVD; No carotid bruits LYMPHATICS: No lymphadenopathy CARDIAC: RRR, no murmurs, rubs,  gallops RESPIRATORY:  Clear to auscultation without rales, wheezing or rhonchi  ABDOMEN: Soft, non-tender, non-distended MUSCULOSKELETAL:  No edema; No deformity  SKIN: Warm and dry NEUROLOGIC:  Alert and oriented x 3 PSYCHIATRIC:  Normal affect   ASSESSMENT:    1. Cardiomyopathy, ischemic   2. VT (ventricular tachycardia)   3. Chronic systolic congestive heart failure (HCC)   4. Coronary artery disease involving native coronary artery of native heart without angina pectoris   5. Implantable cardioverter-defibrillator (ICD) in situ   6. Chronic systolic heart failure (HCC)    PLAN:    In order of problems listed above:  CAD Patient denies anginal symptoms. Last cath in 2020 showed single vessel obstructive CAD involving the distal LAD at the apex, all prior stents were widely patent. No further ischemic work-up at this time. Continue Aspirin, Plavix, lisinopril 10mg  daily, statin, BB.   ICM HFrEF s/p ICD implantation Started on spironolactone at the last visit. BMET today. Unable to afford Entresto in the past. HE is euvolemic on exam. Continue lisinopril. I will increase Coreg to 25mg  BID. He is followed closely by EP.   Tobacco use Still smoking 1 ppd. Cessation encouraged  Palpitations I will increase Coreg to 25mg  BID. HR 63bpm, recommended they monitor HR and symptoms.   HLD No recent LDL, can update at follow-up. Continue Lipitor 80mg  daily  Disposition: Follow up in 2 month(s) with MD   Signed, Sanai Frick , PA-C  12/15/2020 1:55 PM    [facility]

## 2020-12-15 NOTE — Patient Instructions (Signed)
Medication Instructions:  - Your physician recommends that you continue on your current medications as directed. Please refer to the Current Medication list given to you today.  *If you need a refill on your cardiac medications before your next appointment, please call your pharmacy*   Lab Work: - Your physician recommends that you have lab work today: BMP  If you have labs (blood work) drawn today and your tests are completely normal, you will receive your results only by: MyChart Message (if you have MyChart) OR A paper copy in the mail If you have any lab test that is abnormal or we need to change your treatment, we will call you to review the results.   Testing/Procedures: - none ordered   Follow-Up: At [facility], you and your health needs are our priority.  As part of our continuing mission to provide you with exceptional heart care, we have created designated Provider Care Teams.  These Care Teams include your primary Cardiologist (physician) and Advanced Practice Providers (APPs -  Physician Assistants and Nurse Practitioners) who all work together to provide you with the care you need, when you need it.  We recommend signing up for the patient portal called "MyChart".  Sign up information is provided on this After Visit Summary.  MyChart is used to connect with patients for Virtual Visits (Telemedicine).  Patients are able to view lab/test results, encounter notes, upcoming appointments, etc.  Non-urgent messages can be sent to your provider as well.   To learn more about what you can do with MyChart, go to [email or web address].    Your next appointment:   2-3 month(s)  The format for your next appointment:   In Person  Provider:   Sherryl Manges, MD (Medtronic ICD)   Other Instructions N/a

## 2020-12-16 LAB — BASIC METABOLIC PANEL
BUN/Creatinine Ratio: 19 (ref 9–20)
BUN: 16 mg/dL (ref 6–24)
CO2: 22 mmol/L (ref 20–29)
Calcium: 9.6 mg/dL (ref 8.7–10.2)
Chloride: 99 mmol/L (ref 96–106)
Creatinine, Ser: 0.85 mg/dL (ref 0.76–1.27)
Glucose: 174 mg/dL — ABNORMAL HIGH (ref 70–99)
Potassium: 4.4 mmol/L (ref 3.5–5.2)
Sodium: 135 mmol/L (ref 134–144)
eGFR: 107 mL/min/{1.73_m2} (ref >59)

## 2021-01-23 ENCOUNTER — Telehealth: Payer: Self-pay

## 2021-01-23 NOTE — Telephone Encounter (Signed)
Carelink alert- Monitored VT episode 11/29/20.  Attempted to reach pt to assess if he recalls symptoms and med compliance.  No answer, LVM with device clinic # and hours.    Pt has history of treated VT in the setting of low potassium levels.  His therapies are off until reaching VF zone >200bpm.  Meds include Carevedilol 12.5mg  BID, Spironolactone 12.5mg  daily  LOV with Dr. Caryl Comes 10/07/20.

## 2021-01-23 NOTE — Telephone Encounter (Signed)
Amy  I think this is probably afib, note the similar egram despite changes in rate-- can we try and get a view of his intrinsic egram to confirm AV nodal origin Thanks SK

## 2021-01-23 NOTE — Telephone Encounter (Signed)
Pt returned phone call.  Reports he has not had any cardiac symptoms lately.  He confirmed compliance with meds as ordered.    Advised patient I would forward report to Dr. Graciela Husbands for review , although anticipate continue compliance.

## 2021-01-30 ENCOUNTER — Ambulatory Visit (INDEPENDENT_AMBULATORY_CARE_PROVIDER_SITE_OTHER): Payer: Medicare HMO

## 2021-01-30 DIAGNOSIS — I255 Ischemic cardiomyopathy: Secondary | ICD-10-CM | POA: Diagnosis not present

## 2021-01-30 LAB — CUP PACEART REMOTE DEVICE CHECK
Battery Remaining Longevity: 118 mo
Battery Voltage: 3.01 V
Brady Statistic RV Percent Paced: 0.01 %
Date Time Interrogation Session: 20230120112831
HighPow Impedance: 59 Ohm
HighPow Impedance: 85 Ohm
Implantable Lead Implant Date: 20111102
Implantable Lead Location: 753860
Implantable Lead Model: 6947
Implantable Pulse Generator Implant Date: 20200508
Lead Channel Impedance Value: 342 Ohm
Lead Channel Impedance Value: 437 Ohm
Lead Channel Pacing Threshold Amplitude: 1.125 V
Lead Channel Pacing Threshold Pulse Width: 0.4 ms
Lead Channel Sensing Intrinsic Amplitude: 23.75 mV
Lead Channel Sensing Intrinsic Amplitude: 23.75 mV
Lead Channel Setting Pacing Amplitude: 2.5 V
Lead Channel Setting Pacing Pulse Width: 0.4 ms
Lead Channel Setting Sensing Sensitivity: 0.3 mV

## 2021-02-02 IMAGING — MR MR SHOULDER*L* W/O CM
6 series · 40 of 40 positions shown · non-contrast
Comparison: CT left shoulder 07/20/2018. Radiographs 07/18/2018.

CLINICAL DATA: Increased left shoulder pain following revision left
sided defibrillator. No previous relevant surgery.

EXAM:
MRI OF THE LEFT SHOULDER WITHOUT CONTRAST
TECHNIQUE: Multiplanar, multisequence MR imaging of the shoulder was performed.
No intravenous contrast was administered.

[Series 5: PD fat-sat · axial · left · 4.0mm · 0.50mm/px · z∈[-58,+61]mm · 7 of 27 slices shown]
[im 1/27]
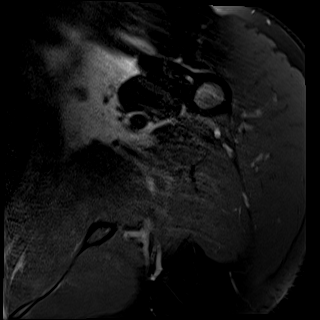
[im 5/27]
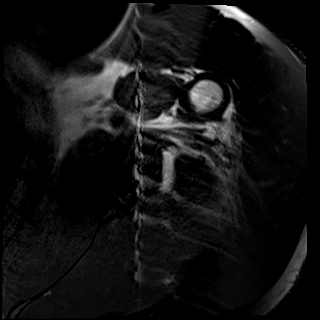
[im 9/27]
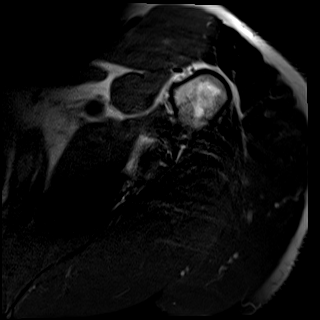
[im 14/27]
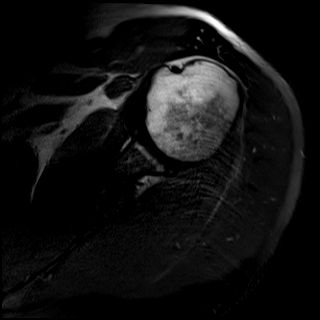
[im 18/27]
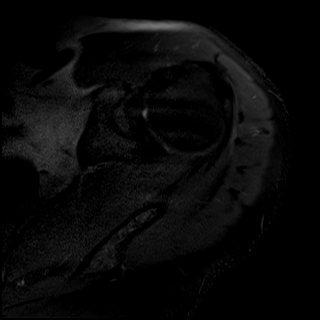
[im 22/27]
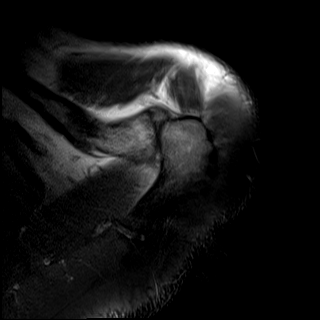
[im 27/27]
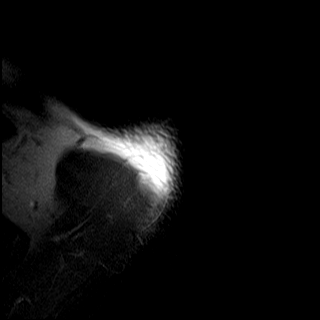

[Series 8: T1 · oblique · left · 4.0mm · 0.62mm/px · 8 of 31 slices shown]
[im 1/31]
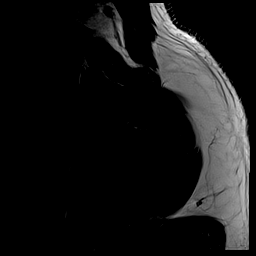
[im 5/31]
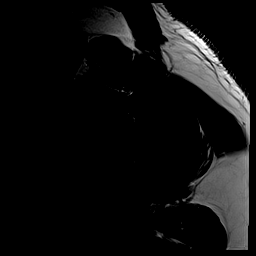
[im 9/31]
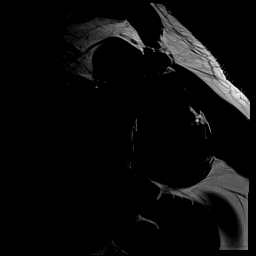
[im 13/31]
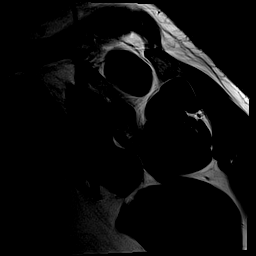
[im 18/31]
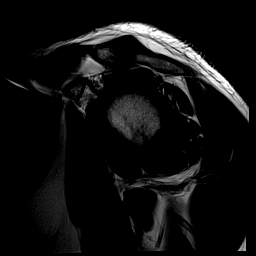
[im 22/31]
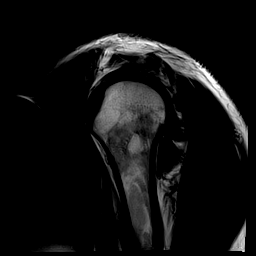
[im 26/31]
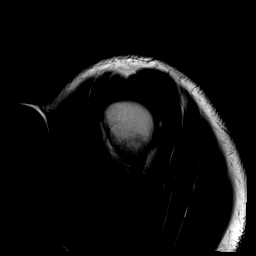
[im 31/31]
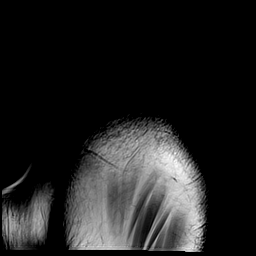

[Series 9: T2 fat-sat · oblique · left · 4.0mm · 0.62mm/px · 7 of 31 slices shown]
[im 1/31]
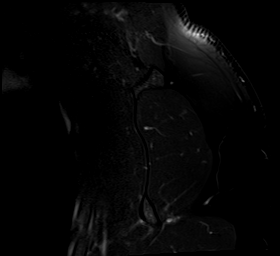
[im 6/31]
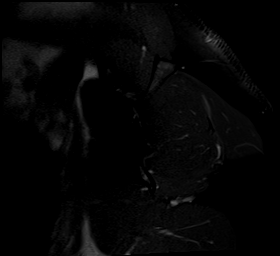
[im 11/31]
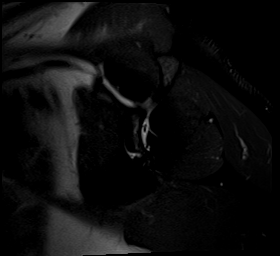
[im 16/31]
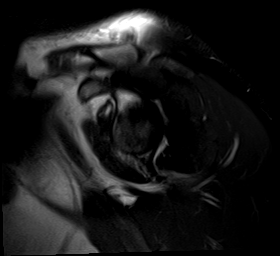
[im 21/31]
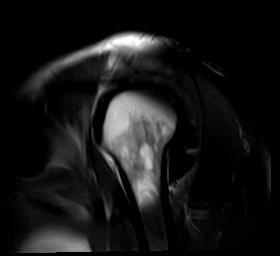
[im 26/31]
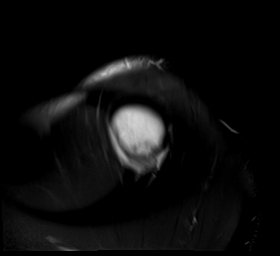
[im 31/31]
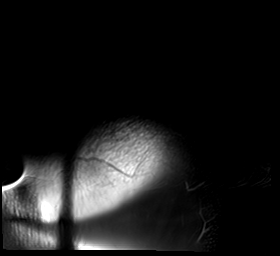

[Series 10: STIR · oblique · left · 4.0mm · 0.62mm/px · 6 of 26 slices shown]
[im 1/26]
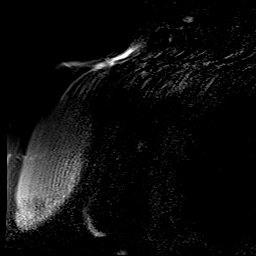
[im 6/26]
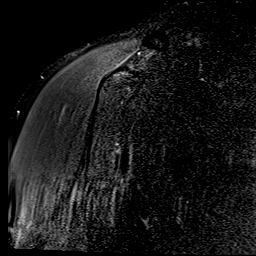
[im 11/26]
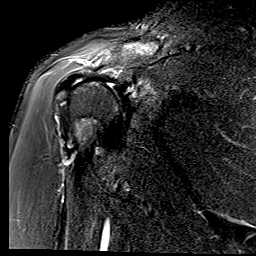
[im 16/26]
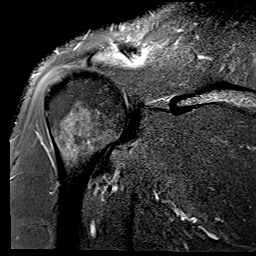
[im 21/26]
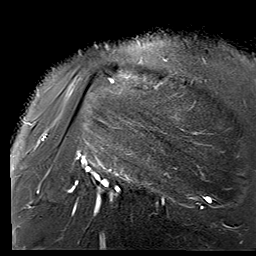
[im 26/26]
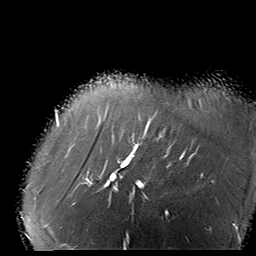

[Series 11: [id number] warp · axial · left · 4.0mm · 0.62mm/px · z∈[-58,+61]mm · 6 of 27 slices shown]
[im 1/27]
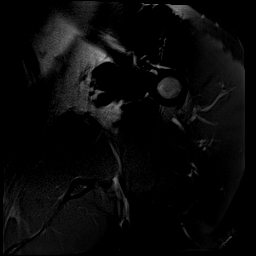
[im 6/27]
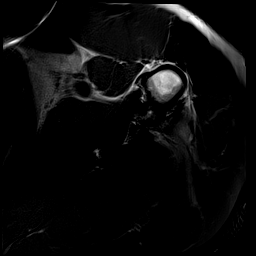
[im 11/27]
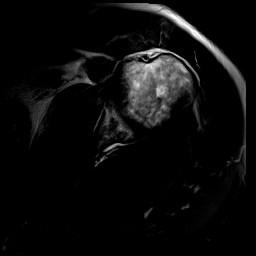
[im 16/27]
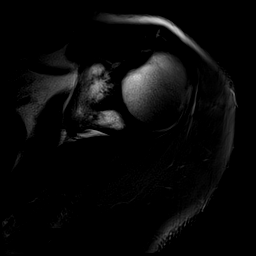
[im 21/27]
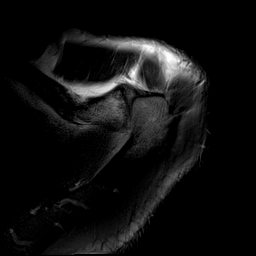
[im 27/27]
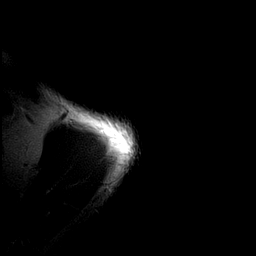

[Series 12: PD · oblique · left · 4.0mm · 0.62mm/px · 6 of 26 slices shown]
[im 1/26]
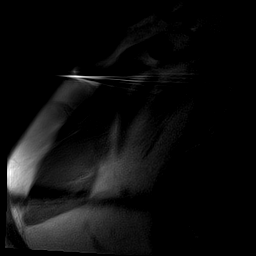
[im 6/26]
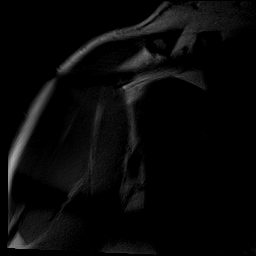
[im 11/26]
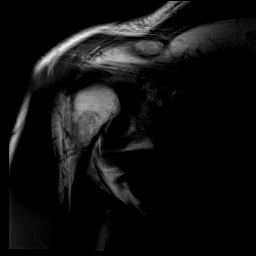
[im 16/26]
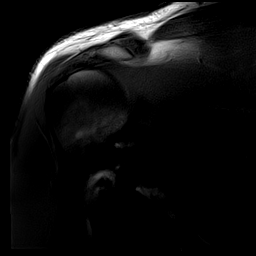
[im 21/26]
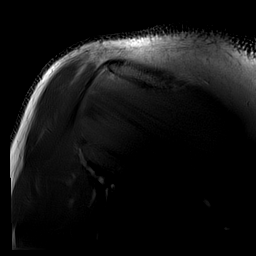
[im 26/26]
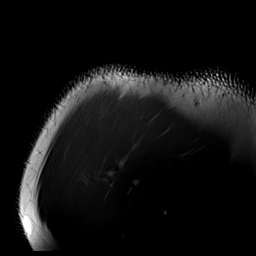

[40 of 40 positions shown; findings below may reference images not displayed]

FINDINGS: The study is mildly degraded by artifact from the left subclavian
defibrillator and breathing artifact.

Rotator cuff:  Intact without significant tendinosis.

Muscles:  No focal muscular atrophy or edema.

Biceps long head:  Intact and normally positioned.

Acromioclavicular Joint: The acromion is type 1. There are mild
acromioclavicular degenerative changes with prominent edema in the
distal clavicle and adjacent acromion on the coronal inversion
recovery images. There is no osteolysis or widening of the AC joint.
No significant fluid is present in the subacromial - subdeltoid
bursa.

Glenohumeral Joint: No significant shoulder joint effusion or
glenohumeral arthropathy.

Labrum: Labral evaluation is limited by the lack of joint fluid and
the artifact. No evidence of labral tear or paralabral cyst.

Bones: No acute or significant extra-articular osseous findings.

Other: No fluid collections or inflammatory changes are seen around
the left shoulder.
IMPRESSION: 1. Intact rotator cuff, biceps tendon and labrum.
2. No periarticular inflammatory changes identified.
3. AC joint arthropathy with associated marrow edema, likely
contributing to superior shoulder pain.

## 2021-02-04 NOTE — Telephone Encounter (Signed)
Afib brief duration

## 2021-02-12 NOTE — Progress Notes (Signed)
Remote ICD transmission.   

## 2021-03-02 ENCOUNTER — Other Ambulatory Visit: Payer: Self-pay | Admitting: Internal Medicine

## 2021-03-03 NOTE — Telephone Encounter (Signed)
This is a [place] pt 

## 2021-03-10 ENCOUNTER — Encounter: Payer: Medicare HMO | Admitting: Internal Medicine

## 2021-03-17 ENCOUNTER — Encounter: Payer: Medicare HMO | Admitting: Internal Medicine

## 2021-04-07 ENCOUNTER — Encounter: Payer: Self-pay | Admitting: Internal Medicine

## 2021-04-07 ENCOUNTER — Ambulatory Visit (INDEPENDENT_AMBULATORY_CARE_PROVIDER_SITE_OTHER): Payer: Medicare HMO | Admitting: Internal Medicine

## 2021-04-07 ENCOUNTER — Other Ambulatory Visit: Payer: Self-pay

## 2021-04-07 VITALS — BP 140/90 | HR 67 | Ht 73.0 in | Wt 251.0 lb

## 2021-04-07 DIAGNOSIS — I255 Ischemic cardiomyopathy: Secondary | ICD-10-CM | POA: Diagnosis not present

## 2021-04-07 DIAGNOSIS — Z95810 Presence of automatic (implantable) cardiac defibrillator: Secondary | ICD-10-CM

## 2021-04-07 DIAGNOSIS — I5022 Chronic systolic (congestive) heart failure: Secondary | ICD-10-CM | POA: Diagnosis not present

## 2021-04-07 DIAGNOSIS — I472 Ventricular tachycardia, unspecified: Secondary | ICD-10-CM

## 2021-04-07 DIAGNOSIS — E7800 Pure hypercholesterolemia, unspecified: Secondary | ICD-10-CM | POA: Diagnosis not present

## 2021-04-07 LAB — PACEMAKER DEVICE OBSERVATION

## 2021-04-07 MED ORDER — LISINOPRIL 20 MG PO TABS: 20.0000 mg | ORAL_TABLET | Freq: Every day | ORAL | 3 refills | Status: DC

## 2021-04-07 NOTE — Progress Notes (Signed)
? ? ? ? ?Patient Care Team: ?Pearson Grippe, MD as PCP - General (Internal Medicine) ?[place], Peter M, MD as PCP - Cardiology (Cardiology) ?Duke Salvia, MD as PCP - Electrophysiology (Cardiology) ? ? ?HPI ? ?Jonathan Fischer is a 51 y.o. male ?Seen in followup for Medtronic ICD implanted 2011 for primary prevention in the setting of complex coronary disease . 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   ?ICD generator replacement by Dr. Derek Jack 5/20 as I was out of hospital because of COVID.  Postprocedure shoulder pain with negative imaging consistent with adhesive capsulitis.    ? ?The patient denies or peripheral edema.  There have been no palpitations, lightheadedness or syncope some dyspnea on exertion.  Sleep disordered breathing with nocturnal apnea witnessed by his wife and sometimes PND significant fatigue.  Does not think he can wear a mask.  Nondescript chest pains and variable.  A gas-like pain in his right upper quadrant right or lower chest lasted 3 to 5 minutes.  Has had midsternal chest discomfort which his wife describes as him being bent over feeling like he cannot breathe; he does not acknowledge that ? ? ?Device History: ?ICD implanted 2011 for primary prevention ?History of Appropriate therapy: yes ?History of Inappropriate therapy: No ?History of AAD therapy: No ?  Date Replacement  Status   ?RA Lead     ?RV Lead 6947 2011    ?LV Lead     ?Generator 2011 2020   ?  ?  ?DATE TEST EF   ?12/13 LHC 25-30% LAD t->>stent ?Cx-S, RCA-S patent  ?10/14 Myoview 36% Large anterior scar  ?7/18 LHC 25-30% mRCA 95% med rx  ?3/20 LHC 25-30% LAD stent; CXp stent RCA x 2 stent>>all patent ?LADd-85%;   ?     ? ?Date Cr K Hgb LDL/TG  ?7/18 0./83 4.6 17.5 --/559  ?4/22  0.84 3.8 17.1   ?12/22 0.85 4.4 17.1   ? ?  ? ?Past Medical History:  ?Diagnosis Date  ? Abnormal echocardiogram   ? a. Possible small layer of mural apical thrombus without mobility by echo 12/2011, not  felt to be candidate for anticoagulation due to noncompliance.  ? AICD (automatic cardioverter/defibrillator) present 11/2009  ? Arthritis   ? AUTOMATIC IMPLANTABLE CARDIAC DEFIBRILLATOR SITU 03/03/2010  ? Qualifier: Diagnosis of  By: Graciela Husbands, MD, Susie Cassette   ? CAD (coronary artery disease)   ? s/p prior LAD, LCx and RCA stenting remotely for an anterior MI, recurrent MI in 2010 with LAD stent occlusion s/p thrombectomy and PTCA, STEMI 12/2011 s/p DES-prox LAD  ? Cardiomyopathy, ischemic 12/14/2011  ? Chronic systolic CHF (congestive heart failure) (HCC)   ? Headache   ? migraines  ? HTN (hypertension)   ? Hyperlipidemia   ? ICD (implantable cardiac defibrillator) in place   ? Ischemic cardiomyopathy   ? EF 25-30% s/p single-chamber Medtronic ICD   ? NSVT (nonsustained ventricular tachycardia) 12/14/2011  ? Obesity   ? STEMI (ST elevation myocardial infarction) (HCC) 12/12/2011  ? Tobacco abuse   ? TOBACCO USER 10/29/2009  ? Qualifier: Diagnosis of  By: Graciela Husbands, MD, Susie Cassette   ? Tooth pain 12/14/2011  ? VT (ventricular tachycardia) 10/18/2012  ? ? ?Past Surgical History:  ?Procedure Laterality Date  ? CARDIAC CATHETERIZATION  03/31/2009  ? CARDIAC CATHETERIZATION  05/08/2008  ? CARDIAC SIZE AND SILHOUETTE NORMAL. THERE WAS ANTERIOR APICAL HYPOKINESIS WITH EF 35-40%  ? CARDIAC CATHETERIZATION  03/14/2018  ? CARDIAC DEFIBRILLATOR PLACEMENT  11/2009  ? CORONARY ANGIOPLASTY  07/19/2016  ? BALLOON ANGIOPLASTY TO THE LAD WITH THROMBUS EXTRACTION OF THE PREVIOUSLY STENTED SEGMENT AND NEW STENT PLACEMENT TO THE LEFT CIRCUMFLEX. EF IS DOWN IN THE 30% RANGE  ? CORONARY ANGIOPLASTY WITH STENT PLACEMENT  12/12/2011  ? 30% mid LCx, mid RCA & mid LCx stents patent. LVEF 25-30%, mid-distal anterolateral wall AK, apex dilated, dyskinetic s/p DES-prox LAD stenosis.  ? CORONARY BALLOON ANGIOPLASTY N/A 07/19/2016  ? Procedure: Coronary Balloon Angioplasty;  Surgeon: SwazilandJordan, Peter M, MD;  [facility];  Service:  Cardiovascular;  Laterality: N/A;  ? defib    ? HAND SURGERY  1994  ? right hand  ? ICD GENERATOR CHANGEOUT N/A 05/19/2018  ? Procedure: ICD GENERATOR CHANGEOUT;  Surgeon: Regan Lemmingamnitz, Will Martin, MD;  [facility];  Service: Cardiovascular;  Laterality: N/A;  ? LEFT HEART CATH Bilateral 12/12/2011  ? Procedure: LEFT HEART CATH;  Surgeon: Peter M SwazilandJordan, MD;  [facility];  Service: Cardiovascular;  Laterality: Bilateral;  ? LEFT HEART CATH AND CORONARY ANGIOGRAPHY N/A 07/19/2016  ? Procedure: Left Heart Cath and Coronary Angiography;  Surgeon: SwazilandJordan, Peter M, MD;  [facility];  Service: Cardiovascular;  Laterality: N/A;  ? LEFT HEART CATH AND CORONARY ANGIOGRAPHY N/A 03/14/2018  ? Procedure: LEFT HEART CATH AND CORONARY ANGIOGRAPHY;  Surgeon: SwazilandJordan, Peter M, MD;  [facility];  Service: Cardiovascular;  Laterality: N/A;  ? ? ?Current Outpatient Medications  ?Medication Sig Dispense Refill  ? aspirin EC 81 MG EC tablet Take 1 tablet (81 mg total) by mouth daily. 30 tablet 3  ? atorvastatin (LIPITOR) 80 MG tablet TAKE 1 TABLET ONE TIME DAILY 90 tablet 2  ? carvedilol (COREG) 12.5 MG tablet TAKE 1 TABLET TWICE DAILY 180 tablet 2  ? clopidogrel (PLAVIX) 75 MG tablet TAKE 1 TABLET EVERY DAY 90 tablet 2  ? glipiZIDE (GLUCOTROL XL) 5 MG 24 hr tablet Take 5 mg by mouth daily. with food    ? ibuprofen (ADVIL) 200 MG tablet Take 800 mg by mouth daily as needed for moderate pain.    ? insulin degludec (TRESIBA FLEXTOUCH) 100 UNIT/ML FlexTouch Pen Inject into the skin daily.    ? lisinopril (ZESTRIL) 10 MG tablet TAKE 1 TABLET EVERY DAY 90 tablet 2  ? nitroGLYCERIN (NITROSTAT) 0.4 MG SL tablet Place 1 tablet (0.4 mg total) under the tongue every 5 (five) minutes as needed. For chest pain 25 tablet 3  ? oxymetazoline (AFRIN) 0.05 % nasal spray Place 1 spray into both nostrils 2 (two) times daily.    ? pantoprazole (PROTONIX) 40 MG tablet TAKE 1 TABLET ONE TIME DAILY (NEED MD  APPOINTMENT) 90 tablet 2  ? spironolactone (ALDACTONE) 25 MG tablet TAKE 1/2 TABLET EVERY DAY 45 tablet 2  ? ?No current facility-administered medications for this visit.  ? ? ?No Known Allergies ? ?Review of Systems negative except from HPI and PMH ? ?Physical Exam ?BP 140/90 (BP Location: Left Arm, Patient Position: Sitting, Cuff Size: Normal)   Pulse 67   Ht 6\' 1"  (1.854 m)   Wt 251 lb (113.9 kg)   SpO2 95%   BMI 33.12 kg/m?  ?Well developed and well nourished in no acute distress ?HENT normal ?Neck supple with JVP-flat ?Clear ?Device pocket well healed; without hematoma or erythema.  There is no tethering  ?Regular rate and rhythm, no  gallop No  murmur ?Abd-soft with active BS ?No Clubbing cyanosis  edema ?Skin-warm and dry ?A & Oriented  Grossly normal sensory and motor function ? ?ECG    ? ? ?Assessment and  Plan ? ?Ventricular tachycardia-recurrent ? ?Ischemic cardiomyopathy  ?  ?Congestive heart failure chronic systolic  ? ?TYobacco abuse  ? ?Implantable defibrillator-Medtronic   ? ?Hyperlipidemia ?  ?Without clear symptoms of ischemia.  Continue atorvastatin 80, ASA 81  Plavix .  However, with   chest pains, not withstanding there vagaries, think it is worth excluding progressive coronary disease given his history of aggressive disease.  His last Myoview showed no ischemia; we will repeat it.  Continue DAPT  ? ?intercurrent sustained ventricular tachycardia duration of which is not entirely clear but lasted at least 10 seconds and potentially up to about a minute and a half.  Just below detection at 190 bpm.  We will not reprogram the device so as to avoid therapies for asymptomatic tachycardia..  Continue carvedilol 12.5. ? ?Pt heart failure status is stable. Continue spironolactone at 12.5 mg.  Electrolytes are stable.  We will recheck today. ? ?Hyperlipidemia last checked 2018 with moderate hypertriglyceridemia   (559) recheck fasting lipid profile today. ? ?He is not inclined towards CPAP.  He is  however open to discussing with pulmonary potentially alternative treatments for sleep apnea.  The pretest likelihood is very high based on his wife's observations and symptoms.  He will let us know ? ?Bloo

## 2021-04-07 NOTE — Patient Instructions (Signed)
Medication Instructions:  ?- Your physician has recommended you make the following change in your medication:  ? ?1) INCREASE lisinopril to 20 mg: ?- take 1 tablet by mouth once daily  ? ?*If you need a refill on your cardiac medications before your next appointment, please call your pharmacy* ? ? ?Lab Work: ?- Your physician recommends that you have lab work today: Lipid/ BMP ? ?If you have labs (blood work) drawn today and your tests are completely normal, you will receive your results only by: ?MyChart Message (if you have MyChart) OR ?A paper copy in the mail ?If you have any lab test that is abnormal or we need to change your treatment, we will call you to review the results. ? ? ?Testing/Procedures: ?- none ordered ? ? ?Follow-Up: ?At [facility], you and your health needs are our priority.  As part of our continuing mission to provide you with exceptional heart care, we have created designated Provider Care Teams.  These Care Teams include your primary Cardiologist (physician) and Advanced Practice Providers (APPs -  Physician Assistants and Nurse Practitioners) who all work together to provide you with the care you need, when you need it. ? ?We recommend signing up for the patient portal called "MyChart".  Sign up information is provided on this After Visit Summary.  MyChart is used to connect with patients for Virtual Visits (Telemedicine).  Patients are able to view lab/test results, encounter notes, upcoming appointments, etc.  Non-urgent messages can be sent to your provider as well.   ?To learn more about what you can do with MyChart, go to [email or web address].   ? ?Your next appointment:   ?1) 6 months with Dr. Swaziland  ? ?2) 1 year with Dr. Graciela Husbands ? ?The format for your next appointment:   ?In Person ? ?Provider:   ?As above   ? ? ?Other Instructions ? ?1) If you decide you would like to proceed with further evaluation of sleep apnea, then please call the office at [phone] and we will  place a referral to [facility] in [place] for this.  ? ?

## 2021-04-08 LAB — BASIC METABOLIC PANEL
BUN/Creatinine Ratio: 15 (ref 9–20)
BUN: 14 mg/dL (ref 6–24)
CO2: 21 mmol/L (ref 20–29)
Calcium: 9.7 mg/dL (ref 8.7–10.2)
Chloride: 97 mmol/L (ref 96–106)
Creatinine, Ser: 0.91 mg/dL (ref 0.76–1.27)
Glucose: 236 mg/dL — ABNORMAL HIGH (ref 70–99)
Potassium: 4.5 mmol/L (ref 3.5–5.2)
Sodium: 137 mmol/L (ref 134–144)
eGFR: 103 mL/min/{1.73_m2} (ref >59)

## 2021-04-08 LAB — LIPID PANEL
Chol/HDL Ratio: 8 ratio — ABNORMAL HIGH (ref 0.0–5.0)
Cholesterol, Total: 231 mg/dL — ABNORMAL HIGH (ref 100–199)
HDL: 29 mg/dL — ABNORMAL LOW (ref >39)
LDL Chol Calc (NIH): 125 mg/dL — ABNORMAL HIGH (ref 0–99)
Triglycerides: 429 mg/dL — ABNORMAL HIGH (ref 0–149)
VLDL Cholesterol Cal: 77 mg/dL — ABNORMAL HIGH (ref 5–40)

## 2021-04-13 ENCOUNTER — Telehealth: Payer: Self-pay | Admitting: *Deleted

## 2021-04-13 DIAGNOSIS — Z006 Encounter for examination for normal comparison and control in clinical research program: Secondary | ICD-10-CM

## 2021-04-13 NOTE — Telephone Encounter (Addendum)
I called patient to give him information about the Prevail Study which is looking at a new drug to lower LDL. I asked patient to call me if he is interested in the study and will be glad to answer any questions he may have. ? ? ?Patient called back but does not want to be in study and make that time commitment to the study. ?

## 2021-04-29 LAB — CUP PACEART INCLINIC DEVICE CHECK
Battery Remaining Longevity: 116 mo
Battery Voltage: 3.01 V
Brady Statistic RV Percent Paced: 0.01 %
Date Time Interrogation Session: 20230328161400
HighPow Impedance: 59 Ohm
HighPow Impedance: 89 Ohm
Implantable Lead Implant Date: 20111102
Implantable Lead Location: 753860
Implantable Lead Model: 6947
Implantable Pulse Generator Implant Date: 20200508
Lead Channel Impedance Value: 380 Ohm
Lead Channel Impedance Value: 437 Ohm
Lead Channel Pacing Threshold Amplitude: 1.125 V
Lead Channel Pacing Threshold Pulse Width: 0.4 ms
Lead Channel Sensing Intrinsic Amplitude: 23.875 mV
Lead Channel Sensing Intrinsic Amplitude: 27.25 mV
Lead Channel Setting Pacing Amplitude: 2.5 V
Lead Channel Setting Pacing Pulse Width: 0.4 ms
Lead Channel Setting Sensing Sensitivity: 0.3 mV

## 2021-05-01 ENCOUNTER — Ambulatory Visit (INDEPENDENT_AMBULATORY_CARE_PROVIDER_SITE_OTHER): Payer: Medicare HMO

## 2021-05-01 DIAGNOSIS — I472 Ventricular tachycardia, unspecified: Secondary | ICD-10-CM | POA: Diagnosis not present

## 2021-05-04 LAB — CUP PACEART REMOTE DEVICE CHECK
Battery Remaining Longevity: 115 mo
Battery Voltage: 3.01 V
Brady Statistic RV Percent Paced: 0.01 %
Date Time Interrogation Session: 20230421072208
HighPow Impedance: 57 Ohm
HighPow Impedance: 84 Ohm
Implantable Lead Implant Date: 20111102
Implantable Lead Location: 753860
Implantable Lead Model: 6947
Implantable Pulse Generator Implant Date: 20200508
Lead Channel Impedance Value: 342 Ohm
Lead Channel Impedance Value: 399 Ohm
Lead Channel Pacing Threshold Amplitude: 1.125 V
Lead Channel Pacing Threshold Pulse Width: 0.4 ms
Lead Channel Sensing Intrinsic Amplitude: 22.375 mV
Lead Channel Sensing Intrinsic Amplitude: 22.375 mV
Lead Channel Setting Pacing Amplitude: 2.5 V
Lead Channel Setting Pacing Pulse Width: 0.4 ms
Lead Channel Setting Sensing Sensitivity: 0.3 mV

## 2021-05-14 ENCOUNTER — Telehealth: Payer: Self-pay

## 2021-05-14 DIAGNOSIS — E7800 Pure hypercholesterolemia, unspecified: Secondary | ICD-10-CM

## 2021-05-14 DIAGNOSIS — I25110 Atherosclerotic heart disease of native coronary artery with unstable angina pectoris: Secondary | ICD-10-CM

## 2021-05-14 NOTE — Telephone Encounter (Signed)
Spoke with pt and advised of lab results as below per Dr Caryl Comes.  Pt verbalizes understanding and is agreeable to Lipid Clinic referral. ? ?  ?  ?Labs are normal x elevated BS and abnormal lipids ?Can we refer him to Lipid clinic for their assistance  ? ?Deboraha Sprang, MD  ?05/13/2021 12:12 PM EDT ?

## 2021-05-14 NOTE — Telephone Encounter (Signed)
-----   Message from Deboraha Sprang, MD sent at 05/13/2021 12:12 PM EDT ----- ?Please Inform Patient ? ?Labs are normal x elevated BS and abnormal lipids ?Can we refer him to Lipid clinic for their assistance  ? ?Thanks  ?

## 2021-05-19 DIAGNOSIS — Z794 Long term (current) use of insulin: Secondary | ICD-10-CM | POA: Diagnosis not present

## 2021-05-19 DIAGNOSIS — I10 Essential (primary) hypertension: Secondary | ICD-10-CM | POA: Diagnosis not present

## 2021-05-19 DIAGNOSIS — E1165 Type 2 diabetes mellitus with hyperglycemia: Secondary | ICD-10-CM | POA: Diagnosis not present

## 2021-05-19 DIAGNOSIS — I255 Ischemic cardiomyopathy: Secondary | ICD-10-CM | POA: Diagnosis not present

## 2021-05-19 NOTE — Progress Notes (Signed)
Remote ICD transmission.   

## 2021-06-02 ENCOUNTER — Encounter: Payer: Self-pay | Admitting: Internal Medicine

## 2021-06-05 DIAGNOSIS — E1165 Type 2 diabetes mellitus with hyperglycemia: Secondary | ICD-10-CM | POA: Diagnosis not present

## 2021-06-12 ENCOUNTER — Ambulatory Visit: Payer: Medicare HMO | Admitting: Pharmacist

## 2021-06-12 NOTE — Progress Notes (Incomplete)
Patient ID: Jonathan Fischer                 DOB: 06-Aug-1970                    MRN: [id number]     HPI: Jonathan Fischer is a 51 y.o. male patient referred to lipid clinic by Dr. Graciela Husbands. PMH is significant for ventricular tachycardia with ICD since 2011, HTN, HLD, T2DM, complex CAD s/p stent placement to the RCA in 2010, STEMI, and HFrEF. Last seen by Dr. Graciela Husbands on 04/07/21 at which time patient reported chest discomfort without clear symptoms of ischemia and a Myoview is planned to rule out progressive CAD given pt history and a lipid panel was ordered given significant cardiac history and risk factors. Pt was referred to lipid clinic to optimize lipid therapy.  Ask why Zetia was stopped. Ask if on [facility] +  [id number] Ask about diet/exercise  Fenofibrate 160 mg $0 Vascepa $45 Repatha $45 (Praluent not on formulary)  Current Medications: Atorvastatin 80 mg daily (since 11/05/19) Intolerances: None noted Risk Factors: STEMI, CAD, HTN, HLD, DM, Obesity LDL goal: <55 (premature CVD and progressive CVD with RF)  Diet:   Exercise:   Family History: Cancer in his mother and heart disease in his father  Social History:   Labs:  A1c 9.7 on 05/19/2021 taking glipizide and Tresiba 36 u daily  Lipid panel 04/07/2021 on Atorvastatin 80 mg daily TC 231, TG 429, HDL 29, VLDL 77, LDL 125  Lipid panel 4 years prior showed TG of 559 and LDL was not able to be assessed.  Past Medical History:  Diagnosis Date   Abnormal echocardiogram    a. 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   AICD (automatic cardioverter/defibrillator) present 11/2009   Arthritis    AUTOMATIC IMPLANTABLE CARDIAC DEFIBRILLATOR SITU 03/03/2010   Qualifier: Diagnosis of  By: Graciela Husbands, MD, [facility], Ty Hilts    CAD (coronary artery disease)    s/p prior LAD, LCx and RCA stenting remotely for an  anterior MI, recurrent MI in 2010 with LAD stent occlusion s/p thrombectomy and PTCA, STEMI 12/2011 s/p DES-prox LAD   Cardiomyopathy, ischemic 12/14/2011   Chronic systolic CHF (congestive heart failure) (HCC)    Headache    migraines   HTN (hypertension)    Hyperlipidemia    ICD (implantable cardiac defibrillator) in place    Ischemic cardiomyopathy    EF 25-30% s/p single-chamber Medtronic ICD    NSVT (nonsustained ventricular tachycardia) 12/14/2011   Obesity    STEMI (ST elevation myocardial infarction) (HCC) 12/12/2011   Tobacco abuse    TOBACCO USER 10/29/2009   Qualifier: Diagnosis of  By: Graciela Husbands, MD, Susie Cassette    Tooth pain 12/14/2011   VT (ventricular tachycardia) 10/18/2012    Current Outpatient Medications on File Prior to Visit  Medication Sig Dispense Refill   aspirin EC 81 MG EC tablet Take 1 tablet (81 mg total) by mouth daily. 30 tablet 3   atorvastatin (LIPITOR) 80 MG tablet TAKE 1 TABLET ONE TIME DAILY 90 tablet 2   carvedilol (COREG) 12.5 MG tablet TAKE 1 TABLET TWICE DAILY 180 tablet 2   clopidogrel (PLAVIX) 75 MG tablet TAKE 1 TABLET EVERY DAY 90 tablet 2   glipiZIDE (GLUCOTROL XL) 5 MG 24 hr tablet Take 5 mg by mouth daily. with food  ibuprofen (ADVIL) 200 MG tablet Take 800 mg by mouth daily as needed for moderate pain.     insulin degludec (TRESIBA FLEXTOUCH) 100 UNIT/ML FlexTouch Pen Inject into the skin daily.     lisinopril (ZESTRIL) 20 MG tablet Take 1 tablet (20 mg total) by mouth daily. 90 tablet 3   nitroGLYCERIN (NITROSTAT) 0.4 MG SL tablet Place 1 tablet (0.4 mg total) under the tongue every 5 (five) minutes as needed. For chest pain 25 tablet 3   oxymetazoline (AFRIN) 0.05 % nasal spray Place 1 spray into both nostrils 2 (two) times daily.     pantoprazole (PROTONIX) 40 MG tablet TAKE 1 TABLET ONE TIME DAILY (NEED MD APPOINTMENT) 90 tablet 2   spironolactone (ALDACTONE) 25 MG tablet TAKE 1/2 TABLET EVERY DAY 45 tablet 2   No current  facility-administered medications on file prior to visit.    No Known Allergies  Assessment/Plan:  1. Hyperlipidemia - LDL of 125, above goal of <55 given extensive history of premature ASCVD and risk factors. Triglycerides 429 above goal of <150. Suspect elevation is secondary to diet and poorly controlled diabetes.

## 2021-07-02 ENCOUNTER — Encounter: Payer: Self-pay | Admitting: *Deleted

## 2021-07-06 DIAGNOSIS — E1165 Type 2 diabetes mellitus with hyperglycemia: Secondary | ICD-10-CM | POA: Diagnosis not present

## 2021-07-31 ENCOUNTER — Ambulatory Visit (INDEPENDENT_AMBULATORY_CARE_PROVIDER_SITE_OTHER): Payer: Medicare HMO

## 2021-07-31 DIAGNOSIS — I255 Ischemic cardiomyopathy: Secondary | ICD-10-CM | POA: Diagnosis not present

## 2021-07-31 LAB — CUP PACEART REMOTE DEVICE CHECK
Battery Remaining Longevity: 111 mo
Battery Voltage: 3.01 V
Brady Statistic RV Percent Paced: 0.07 %
Date Time Interrogation Session: 20230721073823
HighPow Impedance: 60 Ohm
HighPow Impedance: 88 Ohm
Implantable Lead Implant Date: 20111102
Implantable Lead Location: 753860
Implantable Lead Model: 6947
Implantable Pulse Generator Implant Date: 20200508
Lead Channel Impedance Value: 342 Ohm
Lead Channel Impedance Value: 399 Ohm
Lead Channel Pacing Threshold Amplitude: 1.125 V
Lead Channel Pacing Threshold Pulse Width: 0.4 ms
Lead Channel Sensing Intrinsic Amplitude: 23.625 mV
Lead Channel Sensing Intrinsic Amplitude: 23.625 mV
Lead Channel Setting Pacing Amplitude: 2.5 V
Lead Channel Setting Pacing Pulse Width: 0.4 ms
Lead Channel Setting Sensing Sensitivity: 0.3 mV

## 2021-08-05 DIAGNOSIS — E1165 Type 2 diabetes mellitus with hyperglycemia: Secondary | ICD-10-CM | POA: Diagnosis not present

## 2021-08-17 NOTE — Progress Notes (Signed)
Remote ICD transmission.   

## 2021-09-05 DIAGNOSIS — E1165 Type 2 diabetes mellitus with hyperglycemia: Secondary | ICD-10-CM | POA: Diagnosis not present

## 2021-09-29 ENCOUNTER — Telehealth: Payer: Self-pay

## 2021-09-29 NOTE — Patient Instructions (Signed)
Visit Information  Thank you for taking time to visit with me today. Please don't hesitate to contact me if I can be of assistance to you.   Following are the goals we discussed today:   Goals Addressed             This Visit's Progress    COMPLETED: Care Coordination Activities-No follow up required       Care Coordination Interventions: Advised patient to schedule annual wellness visit           If you are experiencing a Mental Health or [place] or need someone to talk to, please call the Suicide and Crisis Lifeline: 988   Patient verbalizes understanding of instructions and care plan provided today and agrees to view in [place]. Active MyChart status and patient understanding of how to access instructions and care plan via MyChart confirmed with patient.     No further follow up required: patient declines   Jone Baseman, RN, MSN [facility] Care Management Coordinator Direct Line [phone]

## 2021-09-29 NOTE — Patient Outreach (Signed)
  Care Coordination   Initial Visit Note   09/29/2021 Name: Jonathan Fischer MRN: [id number] DOB: 11-Jun-1970  Jonathan Fischer is a 51 y.o. year old male who sees Jani Gravel, MD for primary care. I spoke with  Jonathan Fischer by phone today.  What matters to the patients health and wellness today?  none    Goals Addressed             This Visit's Progress    COMPLETED: Care Coordination Activities-No follow up required       Care Coordination Interventions: Advised patient to schedule annual wellness visit          SDOH assessments and interventions completed:  No     Care Coordination Interventions Activated:  Yes  Care Coordination Interventions:  Yes, provided   Follow up plan: No further intervention required.   Encounter Outcome:  Pt. Visit Completed   Jone Baseman, RN, MSN [facility] Care Management Coordinator Direct Line [phone]

## 2021-09-30 NOTE — Progress Notes (Deleted)
Cardiology Office Note:    Date:  09/30/2021   ID:  TABARI VOLKERT, DOB 03-26-1970, MRN [id number]  PCP:  Pearson Grippe, MD  [facility] Cardiologist:  Zylpha Poynor [place], MD  [facility] Electrophysiologist:  Sherryl Manges, MD   Referring MD: Pearson Grippe, MD   Chief Complaint: 2 month follow-up  History of Present Illness:    Jonathan Fischer is a 51 y.o. male with a hx of CAD s/p prior stenting, ICM, HFrEF, VT, s/p ICD implantation, HTN, HLD who presents for follow-up. Last seen by me in March 2020 at time of his last heart cath.   He reports his first stent(s) was 10 years ago at [facility]. He returned 6 months later and had another stent placed. He had a  STEMI in December 2013 with occluded proximal LAD treated with repeat DES of the proximal LAD (occluded at the site of the prior stent). Stent in the LCX was patent. Stent in the mid RCA was patent. EF 25 to 30%. Troponin over 20. Echo shoed EF of 25 to 30% as well, septal apical and anterior wall AK and possible small layer of mural apical thrombus without mobility. Not felt to be a candidate for further anticoagulation due to his noncompliance.    He was readmitted in October 2014 with an ICD discharge. Interrogation confirmed this was ventricular tachycardia. He had normal cardiac enzymes. Potassium was mildly decreased. He had no evidence of increased congestive heart failure. We recommended continued medical therapy with beta blocker and potassium repletion. Echo showed ejection fraction was in the range of 30% to 35%. Akinesis of the mid-distalanteroseptal myocardium. Myoview showed a large anteroseptal scar without ischemia. ejection fraction was in the range of 30% to 35%. Akinesis of the mid-distalanteroseptal myocardium.  ICD interrogation and followup showed no recurrent episodes of ventricular tachycardia. His last remote ICD transmission was in August 2016.    Was seen in 2018 with symptoms of unstable angina. Was off all  medication for 2 years. Underwent cardiac cath that showed focal in stent restenosis in RCA treated successfully with cutting balloon angioplasty. Medication resumed including ASA, plavix, lisinopril, lipitor, and Toprol.   Patient seen 02/2018 and reported worsening anginal symptoms and was set up for repeat cardiac catheterization. Repeat cath showed single vessel obstructive CAD in the dLAD at the apex with all prior stents widely patent, EF 25-30%, normal LVEDP.   He wasseen 10/07/20 and had appropriate therapy for VT with ICD discharge. Started on spironolactone.  Last seen by Dr Graciela Husbands with EP in March this year.  Today, the patient reports palpitations. They can occur any time, not necessarily after exertion. No chest pain or shortness of breath. Needs labs today. No LLE, orthopnea, pnd. No other issues. BP good, HR 63bpm.    Past Medical History:  Diagnosis Date   Abnormal echocardiogram    a. 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   AICD (automatic cardioverter/defibrillator) present 11/2009   Arthritis    AUTOMATIC IMPLANTABLE CARDIAC DEFIBRILLATOR SITU 03/03/2010   Qualifier: Diagnosis of  By: Graciela Husbands, MD, [facility], [place]ts    CAD (coronary artery disease)    s/p prior LAD, LCx and RCA stenting remotely for an anterior MI, recurrent MI in 2010 with LAD stent occlusion s/p thrombectomy and PTCA, STEMI 12/2011 s/p DES-prox LAD   Cardiomyopathy, ischemic 12/14/2011   Chronic systolic CHF (congestive heart  failure) (HCC)    Headache    migraines   HTN (hypertension)    Hyperlipidemia    ICD (implantable cardiac defibrillator) in place    Ischemic cardiomyopathy    EF 25-30% s/p single-chamber Medtronic ICD    NSVT (nonsustained ventricular tachycardia) 12/14/2011   Obesity    STEMI (ST elevation myocardial infarction) (HCC) 12/12/2011   Tobacco abuse    TOBACCO USER 10/29/2009    Qualifier: Diagnosis of  By: Graciela Husbands, MD, Susie Cassette    Tooth pain 12/14/2011   VT (ventricular tachycardia) 10/18/2012    Past Surgical History:  Procedure Laterality Date   CARDIAC CATHETERIZATION  03/31/2009   CARDIAC CATHETERIZATION  05/08/2008   CARDIAC SIZE AND SILHOUETTE NORMAL. THERE WAS ANTERIOR APICAL HYPOKINESIS WITH EF 35-40%   CARDIAC CATHETERIZATION  03/14/2018   CARDIAC DEFIBRILLATOR PLACEMENT  11/2009   CORONARY ANGIOPLASTY  07/19/2016   BALLOON ANGIOPLASTY TO THE LAD WITH THROMBUS EXTRACTION OF THE PREVIOUSLY STENTED SEGMENT AND NEW STENT PLACEMENT TO THE LEFT CIRCUMFLEX. EF IS DOWN IN THE 30% RANGE   CORONARY ANGIOPLASTY WITH STENT PLACEMENT  12/12/2011   30% mid LCx, mid RCA & mid LCx stents patent. LVEF 25-30%, mid-distal anterolateral wall AK, apex dilated, dyskinetic s/p DES-prox LAD stenosis.   CORONARY BALLOON ANGIOPLASTY N/A 07/19/2016   Procedure: Coronary Balloon Angioplasty;  Surgeon: Swaziland, Mical Brun M, MD;  Location: [facility] INVASIVE CV LAB;  Service: Cardiovascular;  Laterality: N/A;   defib     HAND SURGERY  1994   right hand   ICD GENERATOR CHANGEOUT N/A 05/19/2018   Procedure: ICD GENERATOR CHANGEOUT;  Surgeon: Regan Lemming, MD;  Location: [facility] INVASIVE CV LAB;  Service: Cardiovascular;  Laterality: N/A;   LEFT HEART CATH Bilateral 12/12/2011   Procedure: LEFT HEART CATH;  Surgeon: Hodges Treiber M Sw[place], MD;  Location: [facility] CATH LAB;  Service: Cardiovascular;  Laterality: Bilateral;   LEFT HEART CATH AND CORONARY ANGIOGRAPHY N/A 07/19/2016   Procedure: Left Heart Cath and Coronary Angiography;  Surgeon: Sw[place], Dalonda Simoni M, MD;  Location: [facility] INVASIVE CV LAB;  Service: Cardiovascular;  Laterality: N/A;   LEFT HEART CATH AND CORONARY ANGIOGRAPHY N/A 03/14/2018   Procedure: LEFT HEART CATH AND CORONARY ANGIOGRAPHY;  Surgeon: Sw[place], Karelly Dewalt M, MD;  Location: [facility] INVASIVE CV LAB;  Service: Cardiovascular;  Laterality: N/A;    Current Medications: No outpatient medications  have been marked as taking for the 10/05/21 encounter (Appointment) with [place], Berlyn Malina M, MD.     Allergies:   Patient has no known allergies.   Social History   Socioeconomic History   Marital status: Married    Spouse name: Not on file   Number of children: Not on file   Years of education: Not on file   Highest education level: Not on file  Occupational History   Not on file  Tobacco Use   Smoking status: Every Day    Packs/day: 1.00    Years: 24.00    Total pack years: 24.00    Types: E-cigarettes, Cigarettes   Smokeless tobacco: Never  Vaping Use   Vaping Use: Never used  Substance and Sexual Activity   Alcohol use: Yes    Comment: Occassionally   Drug use: No   Sexual activity: Yes  Other Topics Concern   Not on file  Social History Narrative   Not on file   Social Determinants of Health   Financial Resource Strain: Not on file  Food Insecurity: Not on file  Transportation Needs: Not  on file  Physical Activity: Not on file  Stress: Not on file  Social Connections: Not on file     Family History: The patient's family history includes Cancer in his mother; Heart disease in his father.  ROS:   Please see the history of present illness.     All other systems reviewed and are negative.  EKGs/Labs/Other Studies Reviewed:    The following studies were reviewed today:  LHC 02/2018 Dist LAD lesion is 85% stenosed. Prox Cx lesion is 30% stenosed. Balloon angioplasty was performed. Previously placed Prox RCA to Mid RCA stent (unknown type) is widely patent. Previously placed Mid RCA stent (unknown type) is widely patent. Previously placed Prox Cx to Mid Cx stent (unknown type) is widely patent. Previously placed Prox LAD to Mid LAD stent (unknown type) is widely patent. There is severe left ventricular systolic dysfunction. LV end diastolic pressure is normal. The left ventricular ejection fraction is 25-35% by visual estimate.   1. Single vessel  obstructive CAD involving the very distal LAD at the apex. All prior stents are widely patent.  2. Severe LV dysfunction. EF estimated at 25-30% . 3. Normal LVEDP   Plan: optimize CHF therapy. Will switch metoprolol to Coreg 12.5 mg bid. Further titration of meds as outpatient.    EKG:  EKG is  ordered today.  The ekg ordered today demonstrates NSR, 63bpm, q waves anterolateral leads, nonspecific ST changes  Recent Labs: 04/07/2021: BUN 14; Creatinine, Ser 0.91; Potassium 4.5; Sodium 137  Recent Lipid Panel    Component Value Date/Time   CHOL 231 (H) 04/07/2021 1222   TRIG 429 (H) 04/07/2021 1222   HDL 29 (L) 04/07/2021 1222   CHOLHDL 8.0 (H) 04/07/2021 1222   CHOLHDL 7.0 12/13/2011 0500   VLDL 53 (H) 12/13/2011 0500   LDLCALC 125 (H) 04/07/2021 1222     Physical Exam:    VS:  There were no vitals taken for this visit.    Wt Readings from Last 3 Encounters:  04/07/21 251 lb (113.9 kg)  12/15/20 248 lb (112.5 kg)  10/07/20 244 lb (110.7 kg)     GEN:  Well nourished, well developed in no acute distress HEENT: Normal NECK: No JVD; No carotid bruits LYMPHATICS: No lymphadenopathy CARDIAC: RRR, no murmurs, rubs, gallops RESPIRATORY:  Clear to auscultation without rales, wheezing or rhonchi  ABDOMEN: Soft, non-tender, non-distended MUSCULOSKELETAL:  No edema; No deformity  SKIN: Warm and dry NEUROLOGIC:  Alert and oriented x 3 PSYCHIATRIC:  Normal affect   ASSESSMENT:    No diagnosis found.  PLAN:    In order of problems listed above:  CAD s/p 3 vessel stenting in [place] in remote past.  Patient denies anginal symptoms. Last cath in 2020 showed single vessel obstructive CAD involving the distal LAD at the apex, all prior stents were widely patent. No further ischemic work-up at this time. Continue Aspirin, Plavix, lisinopril 10mg  daily, statin, BB.   2. ICM HFrEF s/p ICD implantation Started on spironolactone at the last visit. BMET today. Unable to afford  Entresto in the past. HE is euvolemic on exam. Continue lisinopril. I will increase Coreg to 25mg  BID. He is followed closely by EP.   3. Tobacco use Still smoking 1 ppd. Cessation encouraged  4. Palpitations I will increase Coreg to 25mg  BID. HR 63bpm, recommended they monitor HR and symptoms.   5. HLD No recent LDL, can update at follow-up. Continue Lipitor 80mg  daily  Disposition: Follow up in 2 month(s)  with MD   Signed, Ural Acree SwazilandJordan, MD  09/30/2021 1:22 PM    [facility]

## 2021-10-05 ENCOUNTER — Ambulatory Visit: Payer: Medicare HMO | Attending: Cardiology | Admitting: Cardiology

## 2021-10-06 DIAGNOSIS — E1165 Type 2 diabetes mellitus with hyperglycemia: Secondary | ICD-10-CM | POA: Diagnosis not present

## 2021-12-15 ENCOUNTER — Ambulatory Visit (INDEPENDENT_AMBULATORY_CARE_PROVIDER_SITE_OTHER): Payer: Medicare HMO

## 2021-12-15 DIAGNOSIS — I255 Ischemic cardiomyopathy: Secondary | ICD-10-CM

## 2021-12-15 LAB — CUP PACEART REMOTE DEVICE CHECK
Battery Remaining Longevity: 107 mo
Battery Voltage: 2.94 V
Brady Statistic RV Percent Paced: 0.04 %
Date Time Interrogation Session: 20231205074659
HighPow Impedance: 58 Ohm
HighPow Impedance: 92 Ohm
Implantable Lead Connection Status: 753985
Implantable Lead Implant Date: 20111102
Implantable Lead Location: 753860
Implantable Lead Model: 6947
Implantable Pulse Generator Implant Date: 20200508
Lead Channel Impedance Value: 380 Ohm
Lead Channel Impedance Value: 399 Ohm
Lead Channel Pacing Threshold Amplitude: 1.25 V
Lead Channel Pacing Threshold Pulse Width: 0.4 ms
Lead Channel Sensing Intrinsic Amplitude: 20.875 mV
Lead Channel Sensing Intrinsic Amplitude: 20.875 mV
Lead Channel Setting Pacing Amplitude: 2.5 V
Lead Channel Setting Pacing Pulse Width: 0.4 ms
Lead Channel Setting Sensing Sensitivity: 0.3 mV
Zone Setting Status: 755011
Zone Setting Status: 755011

## 2021-12-20 ENCOUNTER — Other Ambulatory Visit: Payer: Self-pay | Admitting: Internal Medicine

## 2022-01-12 NOTE — Progress Notes (Signed)
Remote ICD transmission.   

## 2022-02-10 NOTE — Progress Notes (Signed)
Cardiology Office Note:    Date:  02/19/2022   ID:  Adline Potter Fischer, DOB 1970-03-15, MRN [id number]  PCP:  Jani Gravel, MD  [facility] Cardiologist:  Cleave Ternes Martinique, MD  [facility] Electrophysiologist:  Virl Axe, MD   Referring MD: Jani Gravel, MD   Chief Complaint: 2 month follow-up  History of Present Illness:    Jonathan Fischer is a 52 y.o. male with a hx of CAD s/p prior stenting, ICM, HFrEF, VT, s/p ICD implantation, HTN, HLD who presents for follow-up. Last seen by me in March 2020. Followed more closely by EP.   He reports his first stent(s) was 10 years ago at [facility]. He returned 6 months later and had another stent placed. He had a  STEMI in December 2013 with occluded proximal LAD treated with repeat DES of the proximal LAD (occluded at the site of the prior stent). Stent in the LCX was patent. Stent in the mid RCA was patent. EF 25 to 30%. Troponin over 20. Echo shoed EF of 25 to 30% as well, septal apical and anterior wall AK and possible small layer of mural apical thrombus without mobility. Not felt to be a candidate for further anticoagulation due to his noncompliance.    He was readmitted in October 2014 with an ICD discharge. Interrogation confirmed this was ventricular tachycardia. He had normal cardiac enzymes. Potassium was mildly decreased. He had no evidence of increased congestive heart failure. We recommended continued medical therapy with beta blocker and potassium repletion. Echo showed ejection fraction was in the range of 30% to 35%. Akinesis of the mid-distalanteroseptal myocardium. Myoview showed a large anteroseptal scar without ischemia. ejection fraction was in the range of 30% to 35%. Akinesis of the mid-distalanteroseptal myocardium.  ICD interrogation and followup showed no recurrent episodes of ventricular tachycardia. His last remote ICD transmission was in August 2016.    Was seen in 2018 with symptoms of unstable angina. Was off all  medication for 2 years. Underwent cardiac cath that showed focal in stent restenosis in RCA treated successfully with cutting balloon angioplasty. Medication resumed including ASA, plavix, lisinopril, lipitor, and Toprol.   Patient seen 02/2018 and reported worsening anginal symptoms and was set up for repeat cardiac catheterization. Repeat cath showed single vessel obstructive CAD in the dLAD at the apex with all prior stents widely patent, EF 25-30%, normal LVEDP.   He was last seen 10/07/20 and had appropriate therapy for VT with ICD discharge. Started on spironolactone.  Last device check in Dec 2023 was normal.   On follow up today he is doing OK. His sugars are consistently high. Usually around 200.  He denies any chest pain or shortness of breath. No palpitations or ICD discharges. He continues to smoke 2 ppd. He states since he has been on Jardiance he has felt better with improvement in energy.    Past Medical History:  Diagnosis Date   Abnormal echocardiogram    a. 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   AICD (automatic cardioverter/defibrillator) present 11/2009   Arthritis    AUTOMATIC IMPLANTABLE CARDIAC DEFIBRILLATOR SITU 03/03/2010   Qualifier: Diagnosis of  By: Caryl Comes, MD, Remus Blake    CAD (coronary artery disease)    s/p prior LAD, LCx and RCA stenting remotely for an anterior MI, recurrent MI in 2010 with LAD stent occlusion s/p thrombectomy and PTCA, STEMI  12/2011 s/p DES-prox LAD   Cardiomyopathy, ischemic [id number]   Chronic systolic CHF (congestive heart failure) (HCC)    Headache    migraines   HTN (hypertension)    Hyperlipidemia    ICD (implantable cardiac defibrillator) in place    Ischemic cardiomyopathy    EF 25-30% s/p single-chamber Medtronic ICD    NSVT (nonsustained ventricular tachycardia) ([place]) 12/14/2011   Obesity    STEMI (ST elevation  myocardial infarction) ([place]) 12/12/2011   Tobacco abuse    TOBACCO USER 10/29/2009   Qualifier: Diagnosis of  By: Caryl Comes, MD, Remus Blake    Tooth pain 12/14/2011   VT (ventricular tachycardia) ([place]) 10/18/2012    Past Surgical History:  Procedure Laterality Date   CARDIAC CATHETERIZATION  03/31/2009   CARDIAC CATHETERIZATION  05/08/2008   CARDIAC SIZE AND SILHOUETTE NORMAL. THERE WAS ANTERIOR APICAL HYPOKINESIS WITH EF 35-40%   CARDIAC CATHETERIZATION  03/14/2018   CARDIAC DEFIBRILLATOR PLACEMENT  11/2009   CORONARY ANGIOPLASTY  07/19/2016   BALLOON ANGIOPLASTY TO THE LAD WITH THROMBUS EXTRACTION OF THE PREVIOUSLY STENTED SEGMENT AND NEW STENT PLACEMENT TO THE LEFT CIRCUMFLEX. EF IS DOWN IN THE 30% RANGE   CORONARY ANGIOPLASTY WITH STENT PLACEMENT  12/12/2011   30% mid LCx, mid RCA & mid LCx stents patent. LVEF 25-30%, mid-distal anterolateral wall AK, apex dilated, dyskinetic s/p DES-prox LAD stenosis.   CORONARY BALLOON ANGIOPLASTY N/A 07/19/2016   Procedure: Coronary Balloon Angioplasty;  Surgeon: Martinique, Julianah Marciel M, MD;  Location: [place] CV LAB;  Service: Cardiovascular;  Laterality: N/A;   defib     HAND SURGERY  1994   right hand   ICD GENERATOR CHANGEOUT N/A 05/19/2018   Procedure: ICD GENERATOR CHANGEOUT;  Surgeon: Constance Haw, MD;  Location: [place] CV LAB;  Service: Cardiovascular;  Laterality: N/A;   LEFT HEART CATH Bilateral 12/12/2011   Procedure: LEFT HEART CATH;  Surgeon: Kijuan Gallicchio M Martinique, MD;  Location: [facility] CATH LAB;  Service: Cardiovascular;  Laterality: Bilateral;   LEFT HEART CATH AND CORONARY ANGIOGRAPHY N/A 07/19/2016   Procedure: Left Heart Cath and Coronary Angiography;  Surgeon: Martinique, Tamarius Rosenfield M, MD;  Location: [place] CV LAB;  Service: Cardiovascular;  Laterality: N/A;   LEFT HEART CATH AND CORONARY ANGIOGRAPHY N/A 03/14/2018   Procedure: LEFT HEART CATH AND CORONARY ANGIOGRAPHY;  Surgeon: Martinique, Kasiah Manka M, MD;  Location: [place] CV LAB;  Service:  Cardiovascular;  Laterality: N/A;    Current Medications: Current Meds  Medication Sig   aspirin EC 81 MG EC tablet Take 1 tablet (81 mg total) by mouth daily.   glipiZIDE (GLUCOTROL XL) 5 MG 24 hr tablet Take 5 mg by mouth daily. with food   ibuprofen (ADVIL) 200 MG tablet Take 800 mg by mouth daily as needed for moderate pain.   insulin degludec (TRESIBA FLEXTOUCH) 100 UNIT/ML FlexTouch Pen Inject into the skin daily.   nitroGLYCERIN (NITROSTAT) 0.4 MG SL tablet Place 1 tablet (0.4 mg total) under the tongue every 5 (five) minutes as needed. For chest pain   oxymetazoline (AFRIN) 0.05 % nasal spray Place 1 spray into both nostrils 2 (two) times daily.   pantoprazole (PROTONIX) 40 MG tablet TAKE 1 TABLET ONE TIME DAILY (NEED MD APPOINTMENT)   [DISCONTINUED] atorvastatin (LIPITOR) 80 MG tablet TAKE 1 TABLET EVERY DAY   [DISCONTINUED] carvedilol (COREG) 12.5 MG tablet TAKE 1 TABLET TWICE DAILY   [DISCONTINUED] clopidogrel (PLAVIX) 75 MG tablet TAKE 1 TABLET EVERY DAY   [DISCONTINUED] JARDIANCE 25 MG  TABS tablet Take 25 mg by mouth daily.   [DISCONTINUED] lisinopril (ZESTRIL) 10 MG tablet TAKE 1 TABLET EVERY DAY   [DISCONTINUED] lisinopril (ZESTRIL) 20 MG tablet Take 1 tablet (20 mg total) by mouth daily.   [DISCONTINUED] spironolactone (ALDACTONE) 25 MG tablet TAKE 1/2 TABLET EVERY DAY     Allergies:   Patient has no known allergies.   Social History   Socioeconomic History   Marital status: Married    Spouse name: Not on file   Number of children: Not on file   Years of education: Not on file   Highest education level: Not on file  Occupational History   Not on file  Tobacco Use   Smoking status: Every Day    Packs/day: 1.00    Years: 24.00    Total pack years: 24.00    Types: E-cigarettes, Cigarettes   Smokeless tobacco: Never  Vaping Use   Vaping Use: Never used  Substance and Sexual Activity   Alcohol use: Yes    Comment: Occassionally   Drug use: No   Sexual  activity: Yes  Other Topics Concern   Not on file  Social History Narrative   Not on file   Social Determinants of Health   Financial Resource Strain: Not on file  Food Insecurity: Not on file  Transportation Needs: Not on file  Physical Activity: Not on file  Stress: Not on file  Social Connections: Not on file     Family History: The patient's family history includes Cancer in his mother; Heart disease in his father.  ROS:   Please see the history of present illness.     All other systems reviewed and are negative.  EKGs/Labs/Other Studies Reviewed:    The following studies were reviewed today:  LHC 02/2018 Dist LAD lesion is 85% stenosed. Prox Cx lesion is 30% stenosed. Balloon angioplasty was performed. Previously placed Prox RCA to Mid RCA stent (unknown type) is widely patent. Previously placed Mid RCA stent (unknown type) is widely patent. Previously placed Prox Cx to Mid Cx stent (unknown type) is widely patent. Previously placed Prox LAD to Mid LAD stent (unknown type) is widely patent. There is severe left ventricular systolic dysfunction. LV end diastolic pressure is normal. The left ventricular ejection fraction is 25-35% by visual estimate.   1. Single vessel obstructive CAD involving the very distal LAD at the apex. All prior stents are widely patent.  2. Severe LV dysfunction. EF estimated at 25-30% . 3. Normal LVEDP   Plan: optimize CHF therapy. Will switch metoprolol to Coreg 12.5 mg bid. Further titration of meds as outpatient.    EKG:  EKG is  ordered today.  The ekg ordered today demonstrates NSR, 68 bpm, old anterolateral infarct. Old inferior infarct. No change. I have personally reviewed and interpreted this study.   Recent Labs: 04/07/2021: BUN 14; Creatinine, Ser 0.91; Potassium 4.5; Sodium 137  Recent Lipid Panel    Component Value Date/Time   CHOL 231 (H) 04/07/2021 1222   TRIG 429 (H) 04/07/2021 1222   HDL 29 (L) 04/07/2021 1222    CHOLHDL 8.0 (H) 04/07/2021 1222   CHOLHDL 7.0 12/13/2011 0500   VLDL 53 (H) 12/13/2011 0500   LDLCALC 125 (H) 04/07/2021 1222     Physical Exam:    VS:  BP 122/78   Pulse 68   Ht 6' 1"$  (1.854 m)   Wt 244 lb 9.6 oz (110.9 kg)   SpO2 95%   BMI 32.27 kg/m  Wt Readings from Last 3 Encounters:  02/19/22 244 lb 9.6 oz (110.9 kg)  04/07/21 251 lb (113.9 kg)  12/15/20 248 lb (112.5 kg)     GEN:  Well nourished, well developed in no acute distress HEENT: Normal NECK: No JVD; No carotid bruits LYMPHATICS: No lymphadenopathy CARDIAC: RRR, no murmurs, rubs, gallops RESPIRATORY:  Clear to auscultation without rales, wheezing or rhonchi  ABDOMEN: Soft, non-tender, non-distended MUSCULOSKELETAL:  No edema; No deformity  SKIN: Warm and dry NEUROLOGIC:  Alert and oriented x 3 PSYCHIATRIC:  Normal affect   ASSESSMENT:    1. Coronary artery disease involving native coronary artery of native heart with unstable angina pectoris ([place])   2. Pure hypercholesterolemia   3. VT (ventricular tachycardia) ([place])   4. Chronic systolic congestive heart failure ([place])   5. Implantable cardioverter-defibrillator (ICD) in situ   6. Medication management     PLAN:    In order of problems listed above:  CAD- multiple stents and infarcts in the past Patient denies anginal symptoms. Last cath in 2020 showed single vessel obstructive CAD involving the distal LAD at the apex, all prior stents were widely patent. No further ischemic work-up at this time. Continue Aspirin, Plavix, lisinopril 32m daily, statin, BB. Currently no significant angina.  2. ICM with chronic systolic CHF.  HFrEF s/p ICD implantation Well compensated on Coreg, lisinopril, aldactone and Jardiance. Unable to afford Entresto. EF 30-35%  3. Tobacco use Still smoking 2 ppd. Cessation encouraged. Does not appear motivated to quit.   4. History of VT. No recent symptoms. ICD in place. On beta blocker  5. HLD- combined.    Continue Lipitor [id number] Follow up labs today  6. DM on insulin, poorly controlled. Will check A1c. Follow up with PCP  Disposition: Follow up in one year   Signed, Jlee Harkless JoMartiniqueMD  02/19/2022 2:35 PM    [facility]

## 2022-02-19 ENCOUNTER — Encounter: Payer: Self-pay | Admitting: Cardiology

## 2022-02-19 ENCOUNTER — Ambulatory Visit: Payer: Medicare HMO | Attending: Cardiology | Admitting: Cardiology

## 2022-02-19 VITALS — BP 122/78 | HR 68 | Ht 73.0 in | Wt 244.6 lb

## 2022-02-19 DIAGNOSIS — I25110 Atherosclerotic heart disease of native coronary artery with unstable angina pectoris: Secondary | ICD-10-CM | POA: Diagnosis not present

## 2022-02-19 DIAGNOSIS — Z79899 Other long term (current) drug therapy: Secondary | ICD-10-CM | POA: Diagnosis not present

## 2022-02-19 DIAGNOSIS — I472 Ventricular tachycardia, unspecified: Secondary | ICD-10-CM | POA: Diagnosis not present

## 2022-02-19 DIAGNOSIS — E7800 Pure hypercholesterolemia, unspecified: Secondary | ICD-10-CM | POA: Diagnosis not present

## 2022-02-19 DIAGNOSIS — I5022 Chronic systolic (congestive) heart failure: Secondary | ICD-10-CM | POA: Diagnosis not present

## 2022-02-19 DIAGNOSIS — Z95810 Presence of automatic (implantable) cardiac defibrillator: Secondary | ICD-10-CM | POA: Diagnosis not present

## 2022-02-19 MED ORDER — CLOPIDOGREL BISULFATE 75 MG PO TABS: 75.0000 mg | ORAL_TABLET | Freq: Every day | ORAL | 3 refills | Status: DC

## 2022-02-19 MED ORDER — LISINOPRIL 10 MG PO TABS: 10.0000 mg | ORAL_TABLET | Freq: Every day | ORAL | 3 refills | Status: DC

## 2022-02-19 MED ORDER — JARDIANCE 25 MG PO TABS: 25.0000 mg | ORAL_TABLET | Freq: Every day | ORAL | 11 refills | Status: DC

## 2022-02-19 MED ORDER — SPIRONOLACTONE 25 MG PO TABS: 12.5000 mg | ORAL_TABLET | Freq: Every day | ORAL | 3 refills | Status: DC

## 2022-02-19 MED ORDER — ATORVASTATIN CALCIUM 80 MG PO TABS: ORAL_TABLET | ORAL | 3 refills | Status: DC

## 2022-02-19 MED ORDER — CARVEDILOL 12.5 MG PO TABS: 12.5000 mg | ORAL_TABLET | Freq: Two times a day (BID) | ORAL | 3 refills | Status: DC

## 2022-02-19 NOTE — Patient Instructions (Signed)
Medication Instructions:  Continue current medications  *If you need a refill on your cardiac medications before your next appointment, please call your pharmacy*   Lab Work: Fasting Lipid, CMP, CBC, A1C  If you have labs (blood work) drawn today and your tests are completely normal, you will receive your results only by: [place] (if you have MyChart) OR A paper copy in the mail If you have any lab test that is abnormal or we need to change your treatment, we will call you to review the results.   Testing/Procedures: None Ordered   Follow-Up: At [facility], you and your health needs are our priority.  As part of our continuing mission to provide you with exceptional heart care, we have created designated Provider Care Teams.  These Care Teams include your primary Cardiologist (physician) and Advanced Practice Providers (APPs -  Physician Assistants and Nurse Practitioners) who all work together to provide you with the care you need, when you need it.  We recommend signing up for the patient portal called "MyChart".  Sign up information is provided on this After Visit Summary.  MyChart is used to connect with patients for Virtual Visits (Telemedicine).  Patients are able to view lab/test results, encounter notes, upcoming appointments, etc.  Non-urgent messages can be sent to your provider as well.   To learn more about what you can do with MyChart, go to [email or web address].    Your next appointment:   1 year(s)  Provider:   Peter Martinique, MD     Other Instructions

## 2022-03-04 ENCOUNTER — Other Ambulatory Visit: Payer: Self-pay | Admitting: Internal Medicine

## 2022-03-23 ENCOUNTER — Ambulatory Visit (INDEPENDENT_AMBULATORY_CARE_PROVIDER_SITE_OTHER): Payer: Medicare HMO

## 2022-03-23 DIAGNOSIS — I255 Ischemic cardiomyopathy: Secondary | ICD-10-CM | POA: Diagnosis not present

## 2022-03-25 LAB — CUP PACEART REMOTE DEVICE CHECK
Battery Remaining Longevity: 102 mo
Battery Voltage: 2.99 V
Brady Statistic RV Percent Paced: 0.01 %
Date Time Interrogation Session: 20240312091054
HighPow Impedance: 59 Ohm
HighPow Impedance: 96 Ohm
Implantable Lead Connection Status: 753985
Implantable Lead Implant Date: 20111102
Implantable Lead Location: 753860
Implantable Lead Model: 6947
Implantable Pulse Generator Implant Date: 20200508
Lead Channel Impedance Value: 342 Ohm
Lead Channel Impedance Value: 437 Ohm
Lead Channel Pacing Threshold Amplitude: 1.25 V
Lead Channel Pacing Threshold Pulse Width: 0.4 ms
Lead Channel Sensing Intrinsic Amplitude: 22.25 mV
Lead Channel Sensing Intrinsic Amplitude: 22.25 mV
Lead Channel Setting Pacing Amplitude: 2.5 V
Lead Channel Setting Pacing Pulse Width: 0.4 ms
Lead Channel Setting Sensing Sensitivity: 0.3 mV
Zone Setting Status: 755011
Zone Setting Status: 755011

## 2022-05-04 NOTE — Progress Notes (Signed)
Remote ICD transmission.   

## 2022-05-10 DIAGNOSIS — M25531 Pain in right wrist: Secondary | ICD-10-CM | POA: Diagnosis not present

## 2022-05-10 DIAGNOSIS — E119 Type 2 diabetes mellitus without complications: Secondary | ICD-10-CM | POA: Diagnosis not present

## 2022-05-13 ENCOUNTER — Other Ambulatory Visit (HOSPITAL_COMMUNITY): Payer: Self-pay | Admitting: Physician Assistant

## 2022-05-13 DIAGNOSIS — M25531 Pain in right wrist: Secondary | ICD-10-CM

## 2022-06-22 ENCOUNTER — Ambulatory Visit (INDEPENDENT_AMBULATORY_CARE_PROVIDER_SITE_OTHER): Payer: Medicare HMO

## 2022-06-22 DIAGNOSIS — I255 Ischemic cardiomyopathy: Secondary | ICD-10-CM | POA: Diagnosis not present

## 2022-06-23 LAB — CUP PACEART REMOTE DEVICE CHECK
Battery Remaining Longevity: 100 mo
Battery Voltage: 2.98 V
Brady Statistic RV Percent Paced: 0.01 %
Date Time Interrogation Session: 20240611103725
HighPow Impedance: 59 Ohm
HighPow Impedance: 91 Ohm
Implantable Lead Connection Status: 753985
Implantable Lead Implant Date: 20111102
Implantable Lead Location: 753860
Implantable Lead Model: 6947
Implantable Pulse Generator Implant Date: 20200508
Lead Channel Impedance Value: 380 Ohm
Lead Channel Impedance Value: 437 Ohm
Lead Channel Pacing Threshold Amplitude: 1.375 V
Lead Channel Pacing Threshold Pulse Width: 0.4 ms
Lead Channel Sensing Intrinsic Amplitude: 31.625 mV
Lead Channel Sensing Intrinsic Amplitude: 31.625 mV
Lead Channel Setting Pacing Amplitude: 2.75 V
Lead Channel Setting Pacing Pulse Width: 0.4 ms
Lead Channel Setting Sensing Sensitivity: 0.3 mV
Zone Setting Status: 755011
Zone Setting Status: 755011

## 2022-07-13 ENCOUNTER — Ambulatory Visit (HOSPITAL_COMMUNITY)
Admission: RE | Admit: 2022-07-13 | Discharge: 2022-07-13 | Disposition: A | Discharge status: Home / Self Care | Payer: Medicare HMO | Source: Ambulatory Visit | Attending: Physician Assistant | Admitting: Physician Assistant

## 2022-07-13 DIAGNOSIS — M25531 Pain in right wrist: Secondary | ICD-10-CM | POA: Diagnosis not present

## 2022-07-13 NOTE — Progress Notes (Signed)
Per order, Changed device settings for MRI to  °OVO  °MRI mode/Tachy-therapies to off  °Will program device back to pre-MRI settings after completion of exam, and send transmission °

## 2022-07-20 NOTE — Progress Notes (Signed)
Remote ICD transmission.   

## 2022-08-03 DIAGNOSIS — M25531 Pain in right wrist: Secondary | ICD-10-CM | POA: Diagnosis not present

## 2022-09-21 ENCOUNTER — Ambulatory Visit (INDEPENDENT_AMBULATORY_CARE_PROVIDER_SITE_OTHER): Payer: Medicare HMO

## 2022-09-21 DIAGNOSIS — I472 Ventricular tachycardia, unspecified: Secondary | ICD-10-CM

## 2022-09-21 DIAGNOSIS — I255 Ischemic cardiomyopathy: Secondary | ICD-10-CM | POA: Diagnosis not present

## 2022-09-22 LAB — CUP PACEART REMOTE DEVICE CHECK
Battery Remaining Longevity: 96 mo
Battery Voltage: 2.97 V
Brady Statistic RV Percent Paced: 0.01 %
Date Time Interrogation Session: 20240910102203
HighPow Impedance: 62 Ohm
HighPow Impedance: 96 Ohm
Implantable Lead Connection Status: 753985
Implantable Lead Implant Date: 20111102
Implantable Lead Location: 753860
Implantable Lead Model: 6947
Implantable Pulse Generator Implant Date: 20200508
Lead Channel Impedance Value: 342 Ohm
Lead Channel Impedance Value: 399 Ohm
Lead Channel Pacing Threshold Amplitude: 1.25 V
Lead Channel Pacing Threshold Pulse Width: 0.4 ms
Lead Channel Sensing Intrinsic Amplitude: 22.125 mV
Lead Channel Sensing Intrinsic Amplitude: 22.125 mV
Lead Channel Setting Pacing Amplitude: 2.5 V
Lead Channel Setting Pacing Pulse Width: 0.4 ms
Lead Channel Setting Sensing Sensitivity: 0.3 mV
Zone Setting Status: 755011
Zone Setting Status: 755011

## 2022-10-08 NOTE — Progress Notes (Signed)
Remote ICD transmission.   

## 2022-12-11 ENCOUNTER — Other Ambulatory Visit: Payer: Self-pay | Admitting: Cardiology

## 2022-12-18 ENCOUNTER — Other Ambulatory Visit: Payer: Self-pay | Admitting: Cardiology

## 2022-12-18 ENCOUNTER — Other Ambulatory Visit: Payer: Self-pay | Admitting: Internal Medicine

## 2022-12-21 ENCOUNTER — Ambulatory Visit (INDEPENDENT_AMBULATORY_CARE_PROVIDER_SITE_OTHER): Payer: Medicare HMO

## 2022-12-21 DIAGNOSIS — I255 Ischemic cardiomyopathy: Secondary | ICD-10-CM

## 2022-12-22 LAB — CUP PACEART REMOTE DEVICE CHECK
Battery Remaining Longevity: 93 mo
Battery Voltage: 2.97 V
Brady Statistic RV Percent Paced: 0.01 %
Date Time Interrogation Session: 20241211112510
HighPow Impedance: 53 Ohm
HighPow Impedance: 80 Ohm
Implantable Lead Connection Status: 753985
Implantable Lead Implant Date: 20111102
Implantable Lead Location: 753860
Implantable Lead Model: 6947
Implantable Pulse Generator Implant Date: 20200508
Lead Channel Impedance Value: 323 Ohm
Lead Channel Impedance Value: 399 Ohm
Lead Channel Pacing Threshold Amplitude: 1.125 V
Lead Channel Pacing Threshold Pulse Width: 0.4 ms
Lead Channel Sensing Intrinsic Amplitude: 25.25 mV
Lead Channel Sensing Intrinsic Amplitude: 25.25 mV
Lead Channel Setting Pacing Amplitude: 2.5 V
Lead Channel Setting Pacing Pulse Width: 0.4 ms
Lead Channel Setting Sensing Sensitivity: 0.3 mV
Zone Setting Status: 755011
Zone Setting Status: 755011

## 2023-01-13 ENCOUNTER — Telehealth: Payer: Self-pay

## 2023-01-13 NOTE — Telephone Encounter (Addendum)
 DC appointment made for Friday 01/14/23 @ 10 to adjust vectors per SK. Also O/d for f/u w/ SK. Sent to EP scheduling.

## 2023-01-14 ENCOUNTER — Ambulatory Visit: Payer: Medicare HMO | Attending: Internal Medicine

## 2023-01-14 DIAGNOSIS — Z95810 Presence of automatic (implantable) cardiac defibrillator: Secondary | ICD-10-CM

## 2023-01-14 NOTE — Progress Notes (Signed)
 Pt brought into device clinic to adjust shock vectors. See paceart for report. Device check not done in entirety. Pt in bigeminy while in office. Histograms do not show that this is a chronic issue. Briefly reviewed w/ SK. Pt o/d for f/u w/ EP. Appointment made w/ SK for 02/16/23. Pt also endorses longterm fatigue that may be worse now? Will reassess histos and pvc burden at [facility] w/ MD. Shock vectors changed to MDT recommendations.

## 2023-01-16 LAB — CUP PACEART INCLINIC DEVICE CHECK
Date Time Interrogation Session: 20250103160731
Implantable Lead Connection Status: 753985
Implantable Lead Implant Date: 20111102
Implantable Lead Location: 753860
Implantable Lead Model: 6947
Implantable Pulse Generator Implant Date: 20200508

## 2023-01-31 NOTE — Addendum Note (Signed)
Addended by: Elease Etienne A on: 01/31/2023 12:23 PM   Modules accepted: Orders

## 2023-01-31 NOTE — Progress Notes (Signed)
Remote ICD transmission.   

## 2023-02-08 ENCOUNTER — Ambulatory Visit: Payer: Medicare HMO | Admitting: Cardiology

## 2023-02-16 ENCOUNTER — Ambulatory Visit: Payer: Medicare HMO | Attending: Internal Medicine | Admitting: Internal Medicine

## 2023-02-16 DIAGNOSIS — Z95810 Presence of automatic (implantable) cardiac defibrillator: Secondary | ICD-10-CM

## 2023-02-16 DIAGNOSIS — I472 Ventricular tachycardia, unspecified: Secondary | ICD-10-CM

## 2023-02-16 DIAGNOSIS — I5022 Chronic systolic (congestive) heart failure: Secondary | ICD-10-CM

## 2023-02-16 DIAGNOSIS — I255 Ischemic cardiomyopathy: Secondary | ICD-10-CM

## 2023-02-17 ENCOUNTER — Encounter: Payer: Self-pay | Admitting: Internal Medicine

## 2023-03-22 ENCOUNTER — Ambulatory Visit: Payer: Medicare HMO

## 2023-03-22 DIAGNOSIS — I255 Ischemic cardiomyopathy: Secondary | ICD-10-CM | POA: Diagnosis not present

## 2023-03-23 LAB — CUP PACEART REMOTE DEVICE CHECK
Battery Remaining Longevity: 89 mo
Battery Voltage: 2.96 V
Brady Statistic RV Percent Paced: 0.02 %
Date Time Interrogation Session: 20250311073725
HighPow Impedance: 101 Ohm
HighPow Impedance: 62 Ohm
Implantable Lead Connection Status: 753985
Implantable Lead Implant Date: 20111102
Implantable Lead Location: 753860
Implantable Lead Model: 6947
Implantable Pulse Generator Implant Date: 20200508
Lead Channel Impedance Value: 399 Ohm
Lead Channel Impedance Value: 437 Ohm
Lead Channel Pacing Threshold Amplitude: 1.25 V
Lead Channel Pacing Threshold Pulse Width: 0.4 ms
Lead Channel Sensing Intrinsic Amplitude: 24.625 mV
Lead Channel Sensing Intrinsic Amplitude: 24.625 mV
Lead Channel Setting Pacing Amplitude: 2.5 V
Lead Channel Setting Pacing Pulse Width: 0.4 ms
Lead Channel Setting Sensing Sensitivity: 0.3 mV
Zone Setting Status: 755011
Zone Setting Status: 755011

## 2023-05-10 NOTE — Progress Notes (Signed)
 Remote ICD transmission.

## 2023-05-10 NOTE — Addendum Note (Signed)
 Addended by: Lott Rouleau A on: 05/10/2023 11:21 AM   Modules accepted: Orders

## 2023-05-16 ENCOUNTER — Other Ambulatory Visit: Payer: Self-pay | Admitting: Cardiology

## 2023-06-21 ENCOUNTER — Ambulatory Visit (INDEPENDENT_AMBULATORY_CARE_PROVIDER_SITE_OTHER): Payer: Medicare HMO

## 2023-06-21 DIAGNOSIS — I255 Ischemic cardiomyopathy: Secondary | ICD-10-CM | POA: Diagnosis not present

## 2023-06-22 LAB — CUP PACEART REMOTE DEVICE CHECK
Battery Remaining Longevity: 84 mo
Battery Voltage: 2.95 V
Brady Statistic RV Percent Paced: 0.03 %
Date Time Interrogation Session: 20250610082603
HighPow Impedance: 109 Ohm
HighPow Impedance: 62 Ohm
Implantable Lead Connection Status: 753985
Implantable Lead Implant Date: 20111102
Implantable Lead Location: 753860
Implantable Lead Model: 6947
Implantable Pulse Generator Implant Date: 20200508
Lead Channel Impedance Value: 399 Ohm
Lead Channel Impedance Value: 456 Ohm
Lead Channel Pacing Threshold Amplitude: 1.125 V
Lead Channel Pacing Threshold Pulse Width: 0.4 ms
Lead Channel Sensing Intrinsic Amplitude: 27.5 mV
Lead Channel Sensing Intrinsic Amplitude: 27.5 mV
Lead Channel Setting Pacing Amplitude: 2.5 V
Lead Channel Setting Pacing Pulse Width: 0.4 ms
Lead Channel Setting Sensing Sensitivity: 0.3 mV
Zone Setting Status: 755011
Zone Setting Status: 755011

## 2023-06-26 ENCOUNTER — Ambulatory Visit: Payer: Self-pay | Admitting: Cardiology

## 2023-07-21 ENCOUNTER — Ambulatory Visit: Attending: Cardiology | Admitting: Cardiology

## 2023-07-21 ENCOUNTER — Encounter: Payer: Self-pay | Admitting: Cardiology

## 2023-07-21 VITALS — BP 145/82 | HR 57 | Ht 73.0 in | Wt 209.8 lb

## 2023-07-21 DIAGNOSIS — Z95810 Presence of automatic (implantable) cardiac defibrillator: Secondary | ICD-10-CM

## 2023-07-21 DIAGNOSIS — I2510 Atherosclerotic heart disease of native coronary artery without angina pectoris: Secondary | ICD-10-CM

## 2023-07-21 DIAGNOSIS — I5022 Chronic systolic (congestive) heart failure: Secondary | ICD-10-CM

## 2023-07-21 DIAGNOSIS — I472 Ventricular tachycardia, unspecified: Secondary | ICD-10-CM

## 2023-07-21 LAB — CUP PACEART INCLINIC DEVICE CHECK
Date Time Interrogation Session: 20250710152137
Implantable Lead Connection Status: 753985
Implantable Lead Implant Date: 20111102
Implantable Lead Location: 753860
Implantable Lead Model: 6947
Implantable Pulse Generator Implant Date: 20200508

## 2023-07-21 NOTE — Patient Instructions (Signed)

## 2023-07-21 NOTE — Progress Notes (Signed)
 Electrophysiology Office Note:   Date:  07/23/2023  ID:  Jonathan Fischer, DOB Sep 11, 1970, MRN [id number]  Primary Cardiologist: Peter Swaziland, MD Electrophysiologist: Fonda Kitty, MD      History of Present Illness:   Jonathan Fischer is a 53 y.o. male with h/o CAD s/p prior stenting, chronic systolic heart failure secondary to ischemic cardiomyopathy, VT, s/p ICD implantation, HTN, HLD who is being seen today for device follow up.  Discussed the use of AI scribe software for clinical note transcription with the patient, who gave verbal consent to proceed.  History of Present Illness Jonathan Fischer is a 53 year old male with ventricular tachycardia and defibrillator implantation who presents for follow-up. He was previously followed by Dr. Fernande.  He has a history of ventricular tachycardia and has been previously shocked by his defibrillator once. The device has been quiet for some time. In February, adjustments were made to his shock vectors, and he experienced a brief episode of non-sustained ventricular tachycardia of less than twenty beats in May. He is on carvedilol , which helps manage his arrhythmias.  He also has a history of heart failure and is on a medication regimen that he feels is effective, although he notes taking many medications. He struggles with insulin and diabetic medication management due to insurance changes, which have disrupted his prescription access.  He experiences reflux and was switched from omeprazole  to pantoprazole  due to interactions with his other medications. His reflux is well-managed with pantoprazole .  Socially, he smokes and finds it difficult to quit. He also notes that he does not go to the doctor unless there is a problem. No chest pain and breathing is okay.   Review of systems complete and found to be negative unless listed in HPI.   EP Information / Studies Reviewed:    EKG is ordered today. Personal review as  below.  EKG Interpretation Date/Time:  Thursday July 21 2023 14:26:20 EDT Ventricular Rate:  57 PR Interval:  148 QRS Duration:  96 QT Interval:  432 QTC Calculation: 420 R Axis:   -23  Text Interpretation: Sinus bradycardia Inferior infarct , age undetermined Anterolateral infarct , age undetermined When compared with ECG of 01-May-2020 00:31, No significant change since Confirmed by Kitty Fonda [phone]) on 07/21/2023 2:36:52 PM   LHC 03/14/18:  Dist LAD lesion is 85% stenosed. Prox Cx lesion is 30% stenosed. Balloon angioplasty was performed. Previously placed Prox RCA to Mid RCA stent (unknown type) is widely patent. Previously placed Mid RCA stent (unknown type) is widely patent. Previously placed Prox Cx to Mid Cx stent (unknown type) is widely patent. Previously placed Prox LAD to Mid LAD stent (unknown type) is widely patent. There is severe left ventricular systolic dysfunction. LV end diastolic pressure is normal. The left ventricular ejection fraction is 25-35% by visual estimate.   1. Single vessel obstructive CAD involving the very distal LAD at the apex. All prior stents are widely patent.  2. Severe LV dysfunction. EF estimated at 25-30% . 3. Normal LVEDP      Physical Exam:   VS:  BP (!) 145/82   Pulse (!) 57   Ht 6' 1 (1.854 m)   Wt 209 lb 12.8 oz (95.2 kg)   SpO2 97%   BMI 27.68 kg/m    Wt Readings from Last 3 Encounters:  07/21/23 209 lb 12.8 oz (95.2 kg)  02/19/22 244 lb 9.6 oz (110.9 kg)  04/07/21 251 lb (113.9 kg)  GEN: Well nourished, well developed in no acute distress NECK: No JVD CARDIAC: Bradycardic, regular rhythm.  Well-healed left chest ICD pocket. RESPIRATORY:  Clear to auscultation without rales, wheezing or rhonchi  ABDOMEN: Soft, non-distended EXTREMITIES:  No edema; No deformity   ASSESSMENT AND PLAN:    #. S/p ICD:  - In-clinic device interrogation was performed today.  Appropriate device function and stable lead parameters.   Presenting rhythm is ventricular sensed.  No arrhythmias or treated episodes since last check.  Estimated longevity 6.6 years.  No programming changes. - Continue remote monitoring.  #. Ventricular tachycardia: Prior device shock, currently well-managed with carvedilol  and device adjustments. No recent episodes, device functioning optimally. - Continue carvedilol  12.5mg  BID. - Monitor burden with ICD.  #. Chronic systolic heart failure 2/2 ischemic cardiomyopathy: Well compensated. - Continue GDMT regimen of carvedilol , empagliflozin , lisinopril , spironolactone .  - Follow up with primary cardiologist.   #. CAD s/p PCI: Denies chest pain. -Continue aspirin  and Plavix .   Follow up with Dr. Kennyth in 12 months  Signed, Fonda Kennyth, MD

## 2023-07-31 ENCOUNTER — Ambulatory Visit: Payer: Self-pay | Admitting: Cardiology

## 2023-08-25 NOTE — Addendum Note (Signed)
 Addended by: VICCI SELLER A on: 08/25/2023 10:01 AM   Modules accepted: Orders

## 2023-08-25 NOTE — Progress Notes (Signed)
 Remote ICD transmission.

## 2023-09-07 NOTE — Progress Notes (Deleted)
 Cardiology Office Note:    Date:  09/07/2023   ID:  Jonathan Fischer, DOB 10-12-1970, MRN [id number]  PCP:  Gerome Brunet, DO  [facility] Cardiologist:  Freman Lapage [place], MD  [facility] Electrophysiologist:  Fonda Kitty, MD   Referring MD: No ref. provider found   Chief Complaint: 2 month follow-up  History of Present Illness:    Jonathan Fischer is a 53 y.o. male with a hx of CAD s/p prior stenting, ICM, HFrEF, VT, s/p ICD implantation, HTN, HLD who presents for follow-up. Last seen by me in March 2020. Followed more closely by EP.   He reports his first stent(s) was 10 years ago at [facility]. He returned 6 months later and had another stent placed. He had a  STEMI in December 2013 with occluded proximal LAD treated with repeat DES of the proximal LAD (occluded at the site of the prior stent). Stent in the LCX was patent. Stent in the mid RCA was patent. EF 25 to 30%. Troponin over 20. Echo shoed EF of 25 to 30% as well, septal apical and anterior wall AK and possible small layer of mural apical thrombus without mobility. Not felt to be a candidate for further anticoagulation due to his noncompliance.    He was readmitted in October 2014 with an ICD discharge. Interrogation confirmed this was ventricular tachycardia. He had normal cardiac enzymes. Potassium was mildly decreased. He had no evidence of increased congestive heart failure. We recommended continued medical therapy with beta blocker and potassium repletion. Echo showed ejection fraction was in the range of 30% to 35%. Akinesis of the mid-distalanteroseptal myocardium. Myoview  showed a large anteroseptal scar without ischemia. ejection fraction was in the range of 30% to 35%. Akinesis of the mid-distalanteroseptal myocardium.  ICD interrogation and followup showed no recurrent episodes of ventricular tachycardia. His last remote ICD transmission was in August 2016.    Was seen in 2018 with symptoms of unstable angina.  Was off all medication for 2 years. Underwent cardiac cath that showed focal in stent restenosis in RCA treated successfully with cutting balloon angioplasty. Medication resumed including ASA, plavix , lisinopril , lipitor , and Toprol .   Patient seen 02/2018 and reported worsening anginal symptoms and was set up for repeat cardiac catheterization. Repeat cath showed single vessel obstructive CAD in the dLAD at the apex with all prior stents widely patent, EF 25-30%, normal LVEDP.   He was last seen 10/07/20 and had appropriate therapy for VT with ICD discharge. Started on spironolactone .  Last device check in Dec 2023 was normal.   On follow up today he is doing OK. His sugars are consistently high. Usually around 200.  He denies any chest pain or shortness of breath. No palpitations or ICD discharges. He continues to smoke 2 ppd. He states since he has been on Jardiance  he has felt better with improvement in energy.    Past Medical History:  Diagnosis Date   Abnormal echocardiogram    a. 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   AICD (automatic cardioverter/defibrillator) present 11/2009   Arthritis    AUTOMATIC IMPLANTABLE CARDIAC DEFIBRILLATOR SITU 03/03/2010   Qualifier: Diagnosis of  By: Fernande, MD, CODY Elspeth Darner    CAD (coronary artery disease)    s/p prior LAD, LCx and RCA stenting remotely for an anterior MI, recurrent MI in 2010 with LAD stent occlusion s/p thrombectomy and PTCA,  STEMI 12/2011 s/p DES-prox LAD   Cardiomyopathy, ischemic 12/14/2011   Chronic systolic CHF (congestive heart failure) (HCC)    Headache    migraines   HTN (hypertension)    Hyperlipidemia    ICD (implantable cardiac defibrillator) in place    Ischemic cardiomyopathy    EF 25-30% s/p single-chamber Medtronic ICD    NSVT (nonsustained ventricular tachycardia) (HCC) 12/14/2011   Obesity    STEMI (ST  elevation myocardial infarction) (HCC) 12/12/2011   Tobacco abuse    TOBACCO USER 10/29/2009   Qualifier: Diagnosis of  By: Fernande, MD, CODY Elspeth Darner    Tooth pain 12/14/2011   VT (ventricular tachycardia) (HCC) 10/18/2012    Past Surgical History:  Procedure Laterality Date   CARDIAC CATHETERIZATION  03/31/2009   CARDIAC CATHETERIZATION  05/08/2008   CARDIAC SIZE AND SILHOUETTE NORMAL. THERE WAS ANTERIOR APICAL HYPOKINESIS WITH EF 35-40%   CARDIAC CATHETERIZATION  03/14/2018   CARDIAC DEFIBRILLATOR PLACEMENT  11/2009   CORONARY ANGIOPLASTY  07/19/2016   BALLOON ANGIOPLASTY TO THE LAD WITH THROMBUS EXTRACTION OF THE PREVIOUSLY STENTED SEGMENT AND NEW STENT PLACEMENT TO THE LEFT CIRCUMFLEX. EF IS DOWN IN THE 30% RANGE   CORONARY ANGIOPLASTY WITH STENT PLACEMENT  12/12/2011   30% mid LCx, mid RCA & mid LCx stents patent. LVEF 25-30%, mid-distal anterolateral wall AK, apex dilated, dyskinetic s/p DES-prox LAD stenosis.   CORONARY BALLOON ANGIOPLASTY N/A 07/19/2016   Procedure: Coronary Balloon Angioplasty;  Surgeon: Swaziland, Nike Southers M, MD;  Location: [facility] INVASIVE CV LAB;  Service: Cardiovascular;  Laterality: N/A;   defib     HAND SURGERY  1994   right hand   ICD GENERATOR CHANGEOUT N/A 05/19/2018   Procedure: ICD GENERATOR CHANGEOUT;  Surgeon: Inocencio Soyla Lunger, MD;  Location: [facility] INVASIVE CV LAB;  Service: Cardiovascular;  Laterality: N/A;   LEFT HEART CATH Bilateral 12/12/2011   Procedure: LEFT HEART CATH;  Surgeon: Dajai Wahlert M [place], MD;  Location: [facility] CATH LAB;  Service: Cardiovascular;  Laterality: Bilateral;   LEFT HEART CATH AND CORONARY ANGIOGRAPHY N/A 07/19/2016   Procedure: Left Heart Cath and Coronary Angiography;  Surgeon: Swaziland, Tayna Smethurst M, MD;  Location: [facility] INVASIVE CV LAB;  Service: Cardiovascular;  Laterality: N/A;   LEFT HEART CATH AND CORONARY ANGIOGRAPHY N/A 03/14/2018   Procedure: LEFT HEART CATH AND CORONARY ANGIOGRAPHY;  Surgeon: Sw[place]iland, Samanthia Howland M, MD;  Location: [facility] INVASIVE CV LAB;   Service: Cardiovascular;  Laterality: N/A;    Current Medications: No outpatient medications have been marked as taking for the 09/09/23 encounter (Appointment) with Sw[place], Omer Monter M, MD.     Allergies:   Patient has no known allergies.   Social History   Socioeconomic History   Marital status: Married    Spouse name: Not on file   Number of children: Not on file   Years of education: Not on file   Highest education level: Not on file  Occupational History   Not on file  Tobacco Use   Smoking status: Every Day    Current packs/day: 1.00    Average packs/day: 1 pack/day for 24.0 years (24.0 ttl pk-yrs)    Types: E-cigarettes, Cigarettes   Smokeless tobacco: Never  Vaping Use   Vaping status: Never Used  Substance and Sexual Activity   Alcohol use: Yes    Comment: Occassionally   Drug use: No   Sexual activity: Yes  Other Topics Concern   Not on file  Social History Narrative   Not on file   Social  Drivers of Corporate investment banker Strain: Not on file  Food Insecurity: Not on file  Transportation Needs: Not on file  Physical Activity: Not on file  Stress: Not on file  Social Connections: Not on file     Family History: The patient's family history includes Cancer in his mother; Heart disease in his father.  ROS:   Please see the history of present illness.     All other systems reviewed and are negative.  EKGs/Labs/Other Studies Reviewed:    The following studies were reviewed today:  LHC 02/2018 Dist LAD lesion is 85% stenosed. Prox Cx lesion is 30% stenosed. Balloon angioplasty was performed. Previously placed Prox RCA to Mid RCA stent (unknown type) is widely patent. Previously placed Mid RCA stent (unknown type) is widely patent. Previously placed Prox Cx to Mid Cx stent (unknown type) is widely patent. Previously placed Prox LAD to Mid LAD stent (unknown type) is widely patent. There is severe left ventricular systolic dysfunction. LV end  diastolic pressure is normal. The left ventricular ejection fraction is 25-35% by visual estimate.   1. Single vessel obstructive CAD involving the very distal LAD at the apex. All prior stents are widely patent.  2. Severe LV dysfunction. EF estimated at 25-30% . 3. Normal LVEDP   Plan: optimize CHF therapy. Will switch metoprolol  to Coreg  12.5 mg bid. Further titration of meds as outpatient.    EKG:  EKG is  ordered today.  The ekg ordered today demonstrates NSR, 68 bpm, old anterolateral infarct. Old inferior infarct. No change. I have personally reviewed and interpreted this study.   Recent Labs: No results found for requested labs within last 365 days.  Recent Lipid Panel    Component Value Date/Time   CHOL 231 (H) 04/07/2021 1222   TRIG 429 (H) 04/07/2021 1222   HDL 29 (L) 04/07/2021 1222   CHOLHDL 8.0 (H) 04/07/2021 1222   CHOLHDL 7.0 12/13/2011 0500   VLDL 53 (H) 12/13/2011 0500   LDLCALC 125 (H) 04/07/2021 1222     Physical Exam:    VS:  There were no vitals taken for this visit.    Wt Readings from Last 3 Encounters:  07/21/23 209 lb 12.8 oz (95.2 kg)  02/19/22 244 lb 9.6 oz (110.9 kg)  04/07/21 251 lb (113.9 kg)     GEN:  Well nourished, well developed in no acute distress HEENT: Normal NECK: No JVD; No carotid bruits LYMPHATICS: No lymphadenopathy CARDIAC: RRR, no murmurs, rubs, gallops RESPIRATORY:  Clear to auscultation without rales, wheezing or rhonchi  ABDOMEN: Soft, non-tender, non-distended MUSCULOSKELETAL:  No edema; No deformity  SKIN: Warm and dry NEUROLOGIC:  Alert and oriented x 3 PSYCHIATRIC:  Normal affect   ASSESSMENT:    No diagnosis found.   PLAN:    In order of problems listed above:  CAD- multiple stents and infarcts in the past Patient denies anginal symptoms. Last cath in 2020 showed single vessel obstructive CAD involving the distal LAD at the apex, all prior stents were widely patent. No further ischemic work-up at this  time. Continue Aspirin , Plavix , lisinopril  10mg  daily, statin, BB. Currently no significant angina.  2. ICM with chronic systolic CHF.  HFrEF s/p ICD implantation Well compensated on Coreg , lisinopril , aldactone  and Jardiance . Unable to afford Entresto . EF 30-35%  3. Tobacco use Still smoking 2 ppd. Cessation encouraged. Does not appear motivated to quit.   4. History of VT. No recent symptoms. ICD in place. On beta blocker  5. HLD- combined.   Continue Lipitor  80mg  daily Follow up labs today  6. DM on insulin, poorly controlled. Will check A1c. Follow up with PCP  Disposition: Follow up in one year   Signed, Elby Blackwelder Swaz[place]and, MD  09/07/2023 8:17 AM    [facility]

## 2023-09-09 ENCOUNTER — Ambulatory Visit: Attending: Cardiology | Admitting: Cardiology

## 2023-09-20 ENCOUNTER — Ambulatory Visit: Payer: Medicare HMO

## 2023-09-20 DIAGNOSIS — I255 Ischemic cardiomyopathy: Secondary | ICD-10-CM | POA: Diagnosis not present

## 2023-09-21 LAB — CUP PACEART REMOTE DEVICE CHECK
Battery Remaining Longevity: 80 mo
Battery Voltage: 2.94 V
Brady Statistic RV Percent Paced: 0.07 %
Date Time Interrogation Session: 20250909093328
HighPow Impedance: 50 Ohm
HighPow Impedance: 79 Ohm
Implantable Lead Connection Status: 753985
Implantable Lead Implant Date: 20111102
Implantable Lead Location: 753860
Implantable Lead Model: 6947
Implantable Pulse Generator Implant Date: 20200508
Lead Channel Impedance Value: 323 Ohm
Lead Channel Impedance Value: 399 Ohm
Lead Channel Pacing Threshold Amplitude: 1.125 V
Lead Channel Pacing Threshold Pulse Width: 0.4 ms
Lead Channel Sensing Intrinsic Amplitude: 23 mV
Lead Channel Sensing Intrinsic Amplitude: 23 mV
Lead Channel Setting Pacing Amplitude: 2.5 V
Lead Channel Setting Pacing Pulse Width: 0.4 ms
Lead Channel Setting Sensing Sensitivity: 0.3 mV
Zone Setting Status: 755011
Zone Setting Status: 755011

## 2023-09-30 NOTE — Progress Notes (Signed)
Remote ICD Transmission.

## 2023-10-02 ENCOUNTER — Ambulatory Visit: Payer: Self-pay | Admitting: Cardiology

## 2023-10-16 ENCOUNTER — Encounter (HOSPITAL_COMMUNITY): Admission: EM | Disposition: A | Payer: Self-pay | Source: Home / Self Care | Attending: Cardiovascular Disease

## 2023-10-16 ENCOUNTER — Encounter (HOSPITAL_COMMUNITY): Payer: Self-pay

## 2023-10-16 ENCOUNTER — Inpatient Hospital Stay (HOSPITAL_COMMUNITY)
Admission: EM | Admit: 2023-10-16 | Discharge: 2023-10-18 | DRG: 322 | Disposition: A | Discharge status: Home / Self Care | Attending: Cardiovascular Disease | Admitting: Cardiovascular Disease

## 2023-10-16 ENCOUNTER — Other Ambulatory Visit: Payer: Self-pay

## 2023-10-16 ENCOUNTER — Inpatient Hospital Stay (HOSPITAL_COMMUNITY)

## 2023-10-16 ENCOUNTER — Emergency Department (HOSPITAL_COMMUNITY)

## 2023-10-16 DIAGNOSIS — Z7984 Long term (current) use of oral hypoglycemic drugs: Secondary | ICD-10-CM

## 2023-10-16 DIAGNOSIS — F17210 Nicotine dependence, cigarettes, uncomplicated: Secondary | ICD-10-CM | POA: Diagnosis not present

## 2023-10-16 DIAGNOSIS — Z794 Long term (current) use of insulin: Secondary | ICD-10-CM | POA: Diagnosis not present

## 2023-10-16 DIAGNOSIS — Y838 Other surgical procedures as the cause of abnormal reaction of the patient, or of later complication, without mention of misadventure at the time of the procedure: Secondary | ICD-10-CM | POA: Diagnosis not present

## 2023-10-16 DIAGNOSIS — R079 Chest pain, unspecified: Secondary | ICD-10-CM

## 2023-10-16 DIAGNOSIS — Z95810 Presence of automatic (implantable) cardiac defibrillator: Secondary | ICD-10-CM | POA: Diagnosis present

## 2023-10-16 DIAGNOSIS — F17200 Nicotine dependence, unspecified, uncomplicated: Secondary | ICD-10-CM | POA: Diagnosis present

## 2023-10-16 DIAGNOSIS — I97190 Other postprocedural cardiac functional disturbances following cardiac surgery: Secondary | ICD-10-CM | POA: Diagnosis not present

## 2023-10-16 DIAGNOSIS — I110 Hypertensive heart disease with heart failure: Secondary | ICD-10-CM | POA: Diagnosis not present

## 2023-10-16 DIAGNOSIS — Z79899 Other long term (current) drug therapy: Secondary | ICD-10-CM | POA: Diagnosis not present

## 2023-10-16 DIAGNOSIS — I493 Ventricular premature depolarization: Secondary | ICD-10-CM | POA: Diagnosis present

## 2023-10-16 DIAGNOSIS — I209 Angina pectoris, unspecified: Secondary | ICD-10-CM | POA: Diagnosis present

## 2023-10-16 DIAGNOSIS — I2119 ST elevation (STEMI) myocardial infarction involving other coronary artery of inferior wall: Secondary | ICD-10-CM | POA: Diagnosis present

## 2023-10-16 DIAGNOSIS — R9431 Abnormal electrocardiogram [ECG] [EKG]: Secondary | ICD-10-CM | POA: Diagnosis not present

## 2023-10-16 DIAGNOSIS — E785 Hyperlipidemia, unspecified: Secondary | ICD-10-CM | POA: Diagnosis present

## 2023-10-16 DIAGNOSIS — I77810 Thoracic aortic ectasia: Secondary | ICD-10-CM | POA: Diagnosis present

## 2023-10-16 DIAGNOSIS — I2121 ST elevation (STEMI) myocardial infarction involving left circumflex coronary artery: Secondary | ICD-10-CM | POA: Diagnosis not present

## 2023-10-16 DIAGNOSIS — I4891 Unspecified atrial fibrillation: Secondary | ICD-10-CM | POA: Diagnosis not present

## 2023-10-16 DIAGNOSIS — E1165 Type 2 diabetes mellitus with hyperglycemia: Secondary | ICD-10-CM | POA: Diagnosis present

## 2023-10-16 DIAGNOSIS — I255 Ischemic cardiomyopathy: Secondary | ICD-10-CM | POA: Diagnosis present

## 2023-10-16 DIAGNOSIS — Z7902 Long term (current) use of antithrombotics/antiplatelets: Secondary | ICD-10-CM

## 2023-10-16 DIAGNOSIS — I472 Ventricular tachycardia, unspecified: Secondary | ICD-10-CM | POA: Diagnosis not present

## 2023-10-16 DIAGNOSIS — G43909 Migraine, unspecified, not intractable, without status migrainosus: Secondary | ICD-10-CM | POA: Diagnosis present

## 2023-10-16 DIAGNOSIS — R1111 Vomiting without nausea: Secondary | ICD-10-CM | POA: Diagnosis not present

## 2023-10-16 DIAGNOSIS — I252 Old myocardial infarction: Secondary | ICD-10-CM | POA: Diagnosis not present

## 2023-10-16 DIAGNOSIS — Z8249 Family history of ischemic heart disease and other diseases of the circulatory system: Secondary | ICD-10-CM | POA: Diagnosis not present

## 2023-10-16 DIAGNOSIS — I2510 Atherosclerotic heart disease of native coronary artery without angina pectoris: Secondary | ICD-10-CM | POA: Diagnosis not present

## 2023-10-16 DIAGNOSIS — J9811 Atelectasis: Secondary | ICD-10-CM | POA: Diagnosis not present

## 2023-10-16 DIAGNOSIS — I5022 Chronic systolic (congestive) heart failure: Secondary | ICD-10-CM | POA: Diagnosis present

## 2023-10-16 DIAGNOSIS — I2109 ST elevation (STEMI) myocardial infarction involving other coronary artery of anterior wall: Secondary | ICD-10-CM | POA: Diagnosis present

## 2023-10-16 DIAGNOSIS — Z7982 Long term (current) use of aspirin: Secondary | ICD-10-CM

## 2023-10-16 DIAGNOSIS — E119 Type 2 diabetes mellitus without complications: Secondary | ICD-10-CM | POA: Diagnosis not present

## 2023-10-16 DIAGNOSIS — I25110 Atherosclerotic heart disease of native coronary artery with unstable angina pectoris: Secondary | ICD-10-CM | POA: Diagnosis present

## 2023-10-16 DIAGNOSIS — Z955 Presence of coronary angioplasty implant and graft: Principal | ICD-10-CM

## 2023-10-16 DIAGNOSIS — I213 ST elevation (STEMI) myocardial infarction of unspecified site: Principal | ICD-10-CM | POA: Diagnosis present

## 2023-10-16 DIAGNOSIS — I499 Cardiac arrhythmia, unspecified: Secondary | ICD-10-CM | POA: Diagnosis not present

## 2023-10-16 HISTORY — PX: CORONARY/GRAFT ACUTE MI REVASCULARIZATION: CATH118305

## 2023-10-16 HISTORY — PX: LEFT HEART CATH AND CORONARY ANGIOGRAPHY: CATH118249

## 2023-10-16 LAB — COMPREHENSIVE METABOLIC PANEL WITH GFR
ALT: 18 U/L (ref 0–44)
AST: 16 U/L (ref 15–41)
Albumin: 3.8 g/dL (ref 3.5–5.0)
Alkaline Phosphatase: 61 U/L (ref 38–126)
Anion gap: 14 (ref 5–15)
BUN: 16 mg/dL (ref 6–20)
CO2: 20 mmol/L — ABNORMAL LOW (ref 22–32)
Calcium: 8.9 mg/dL (ref 8.9–10.3)
Chloride: 102 mmol/L (ref 98–111)
Creatinine, Ser: 0.85 mg/dL (ref 0.61–1.24)
GFR, Estimated: >60 mL/min (ref >60)
Glucose, Bld: 275 mg/dL — ABNORMAL HIGH (ref 70–99)
Potassium: 3.8 mmol/L (ref 3.5–5.1)
Sodium: 136 mmol/L (ref 135–145)
Total Bilirubin: 0.8 mg/dL (ref 0.0–1.2)
Total Protein: 7.2 g/dL (ref 6.5–8.1)

## 2023-10-16 LAB — LIPID PANEL
Cholesterol: 179 mg/dL (ref 0–200)
HDL: 26 mg/dL — ABNORMAL LOW (ref >40)
LDL Cholesterol: 115 mg/dL — ABNORMAL HIGH (ref 0–99)
Total CHOL/HDL Ratio: 6.9 ratio
Triglycerides: 191 mg/dL — ABNORMAL HIGH (ref <150)
VLDL: 38 mg/dL (ref 0–40)

## 2023-10-16 LAB — HEMOGLOBIN A1C
Hgb A1c MFr Bld: 10 % — ABNORMAL HIGH (ref 4.8–5.6)
Mean Plasma Glucose: 240.3 mg/dL

## 2023-10-16 LAB — CBC
HCT: 45.1 % (ref 39.0–52.0)
Hemoglobin: 15.8 g/dL (ref 13.0–17.0)
MCH: 31.2 pg (ref 26.0–34.0)
MCHC: 35 g/dL (ref 30.0–36.0)
MCV: 89 fL (ref 80.0–100.0)
Platelets: 221 K/uL (ref 150–400)
RBC: 5.07 MIL/uL (ref 4.22–5.81)
RDW: 12.5 % (ref 11.5–15.5)
WBC: 14.9 K/uL — ABNORMAL HIGH (ref 4.0–10.5)
nRBC: 0 % (ref 0.0–0.2)

## 2023-10-16 LAB — CBC WITH DIFFERENTIAL/PLATELET
Abs Immature Granulocytes: 0.11 K/uL — ABNORMAL HIGH (ref 0.00–0.07)
Basophils Absolute: 0.1 K/uL (ref 0.0–0.1)
Basophils Relative: 1 %
Eosinophils Absolute: 0.3 K/uL (ref 0.0–0.5)
Eosinophils Relative: 2 %
HCT: 46.7 % (ref 39.0–52.0)
Hemoglobin: 16.4 g/dL (ref 13.0–17.0)
Immature Granulocytes: 1 %
Lymphocytes Relative: 54 %
Lymphs Abs: 9.7 K/uL — ABNORMAL HIGH (ref 0.7–4.0)
MCH: 31.6 pg (ref 26.0–34.0)
MCHC: 35.1 g/dL (ref 30.0–36.0)
MCV: 90 fL (ref 80.0–100.0)
Monocytes Absolute: 1.3 K/uL — ABNORMAL HIGH (ref 0.1–1.0)
Monocytes Relative: 7 %
Neutro Abs: 6.1 K/uL (ref 1.7–7.7)
Neutrophils Relative %: 35 %
Platelets: 290 K/uL (ref 150–400)
RBC: 5.19 MIL/uL (ref 4.22–5.81)
RDW: 12.5 % (ref 11.5–15.5)
Smear Review: NORMAL
WBC: 17.6 K/uL — ABNORMAL HIGH (ref 4.0–10.5)
nRBC: 0 % (ref 0.0–0.2)

## 2023-10-16 LAB — TROPONIN I (HIGH SENSITIVITY)
Troponin I (High Sensitivity): 7 ng/L (ref <18)
Troponin I (High Sensitivity): 8323 ng/L (ref <18)

## 2023-10-16 LAB — CG4 I-STAT (LACTIC ACID)
Lactic Acid, Venous: 1.2 mmol/L (ref 0.5–1.9)
Lactic Acid, Venous: 1.9 mmol/L (ref 0.5–1.9)

## 2023-10-16 LAB — ECHOCARDIOGRAM COMPLETE
AR max vel: 2.34 cm2
AV Area VTI: 2.35 cm2
AV Area mean vel: 1.94 cm2
AV Mean grad: 9 mmHg
AV Peak grad: 15.8 mmHg
Ao pk vel: 1.99 m/s
Area-P 1/2: 3.03 cm2
Calc EF: 38.4 %
Height: 73 in
MV VTI: 2.59 cm2
S' Lateral: 5.3 cm
Single Plane A2C EF: 41.1 %
Single Plane A4C EF: 33.8 %
Weight: 3389.79 [oz_av]

## 2023-10-16 LAB — HIV ANTIBODY (ROUTINE TESTING W REFLEX): HIV Screen 4th Generation wRfx: NONREACTIVE

## 2023-10-16 LAB — BASIC METABOLIC PANEL WITH GFR
Anion gap: 10 (ref 5–15)
BUN: 15 mg/dL (ref 6–20)
CO2: 23 mmol/L (ref 22–32)
Calcium: 9 mg/dL (ref 8.9–10.3)
Chloride: 102 mmol/L (ref 98–111)
Creatinine, Ser: 0.81 mg/dL (ref 0.61–1.24)
GFR, Estimated: >60 mL/min (ref >60)
Glucose, Bld: 298 mg/dL — ABNORMAL HIGH (ref 70–99)
Potassium: 4.2 mmol/L (ref 3.5–5.1)
Sodium: 135 mmol/L (ref 135–145)

## 2023-10-16 LAB — I-STAT CG4 LACTIC ACID, ED: Lactic Acid, Venous: 2.6 mmol/L (ref 0.5–1.9)

## 2023-10-16 LAB — LACTIC ACID, PLASMA: Lactic Acid, Venous: 1.8 mmol/L (ref 0.5–1.9)

## 2023-10-16 LAB — GLUCOSE, CAPILLARY
Glucose-Capillary: 320 mg/dL — ABNORMAL HIGH (ref 70–99)
Glucose-Capillary: 332 mg/dL — ABNORMAL HIGH (ref 70–99)
Glucose-Capillary: 241 mg/dL — ABNORMAL HIGH (ref 70–99)
Glucose-Capillary: 276 mg/dL — ABNORMAL HIGH (ref 70–99)
Glucose-Capillary: 297 mg/dL — ABNORMAL HIGH (ref 70–99)

## 2023-10-16 LAB — POCT ACTIVATED CLOTTING TIME
Activated Clotting Time: 233 s
Activated Clotting Time: 268 s
Activated Clotting Time: 262 s

## 2023-10-16 LAB — MRSA NEXT GEN BY PCR, NASAL: MRSA by PCR Next Gen: NOT DETECTED

## 2023-10-16 LAB — PROTIME-INR
INR: 1.1 (ref 0.8–1.2)
Prothrombin Time: 14.3 s (ref 11.4–15.2)

## 2023-10-16 LAB — APTT: aPTT: 27 s (ref 24–36)

## 2023-10-16 SURGERY — CORONARY/GRAFT ACUTE MI REVASCULARIZATION
Anesthesia: LOCAL

## 2023-10-16 MED ORDER — SODIUM CHLORIDE 0.9% FLUSH
3.0000 mL | Freq: Two times a day (BID) | INTRAVENOUS | Status: DC
  Administered 2023-10-16 – 2023-10-18 (×6): 3 mL via INTRAVENOUS

## 2023-10-16 MED ORDER — NICOTINE 14 MG/24HR TD PT24
14.0000 mg | MEDICATED_PATCH | Freq: Every day | TRANSDERMAL | Status: DC
  Administered 2023-10-16 – 2023-10-18 (×3): 14 mg via TRANSDERMAL
  Filled 2023-10-16 (×3): qty 1

## 2023-10-16 MED ORDER — HEPARIN (PORCINE) IN NACL 1000-0.9 UT/500ML-% IV SOLN
INTRAVENOUS | Status: DC | PRN
  Administered 2023-10-16 (×2): 500 mL

## 2023-10-16 MED ORDER — ACETAMINOPHEN 325 MG PO TABS: 650.0000 mg | ORAL_TABLET | ORAL | Status: DC | PRN

## 2023-10-16 MED ORDER — VERAPAMIL HCL 2.5 MG/ML IV SOLN
INTRAVENOUS | Status: AC
  Filled 2023-10-16: qty 2

## 2023-10-16 MED ORDER — PERFLUTREN LIPID MICROSPHERE
1.0000 mL | INTRAVENOUS | Status: AC | PRN
  Administered 2023-10-16: 4 mL via INTRAVENOUS

## 2023-10-16 MED ORDER — VERAPAMIL HCL 2.5 MG/ML IV SOLN
INTRAVENOUS | Status: DC | PRN
  Administered 2023-10-16: 10 mL via INTRA_ARTERIAL

## 2023-10-16 MED ORDER — TICAGRELOR 90 MG PO TABS
ORAL_TABLET | ORAL | Status: DC | PRN
  Administered 2023-10-16: 180 mg via ORAL

## 2023-10-16 MED ORDER — HEPARIN SODIUM (PORCINE) 1000 UNIT/ML IJ SOLN
INTRAMUSCULAR | Status: AC
  Filled 2023-10-16: qty 10

## 2023-10-16 MED ORDER — HEPARIN SODIUM (PORCINE) 5000 UNIT/ML IJ SOLN
INTRAMUSCULAR | Status: AC
  Filled 2023-10-16: qty 1

## 2023-10-16 MED ORDER — FENTANYL CITRATE (PF) 100 MCG/2ML IJ SOLN
INTRAMUSCULAR | Status: DC | PRN
  Administered 2023-10-16: 25 ug via INTRAVENOUS

## 2023-10-16 MED ORDER — MIDAZOLAM HCL 2 MG/2ML IJ SOLN
INTRAMUSCULAR | Status: AC
  Filled 2023-10-16: qty 2

## 2023-10-16 MED ORDER — SODIUM CHLORIDE 0.9 % IV SOLN: 250.0000 mL | INTRAVENOUS | Status: AC | PRN

## 2023-10-16 MED ORDER — ONDANSETRON HCL 4 MG/2ML IJ SOLN: 4.0000 mg | Freq: Four times a day (QID) | INTRAMUSCULAR | Status: DC | PRN

## 2023-10-16 MED ORDER — ORAL CARE MOUTH RINSE: 15.0000 mL | OROMUCOSAL | Status: DC | PRN

## 2023-10-16 MED ORDER — ASPIRIN 81 MG PO TBEC
81.0000 mg | DELAYED_RELEASE_TABLET | Freq: Every day | ORAL | Status: DC
  Administered 2023-10-17 – 2023-10-18 (×2): 81 mg via ORAL
  Filled 2023-10-16 (×2): qty 1

## 2023-10-16 MED ORDER — TICAGRELOR 90 MG PO TABS
ORAL_TABLET | ORAL | Status: AC
  Filled 2023-10-16: qty 2

## 2023-10-16 MED ORDER — MIDAZOLAM HCL 2 MG/2ML IJ SOLN
INTRAMUSCULAR | Status: DC | PRN
  Administered 2023-10-16: 2 mg via INTRAVENOUS

## 2023-10-16 MED ORDER — LIDOCAINE HCL (PF) 1 % IJ SOLN
INTRAMUSCULAR | Status: DC | PRN
  Administered 2023-10-16: 2 mL

## 2023-10-16 MED ORDER — LIDOCAINE HCL (PF) 1 % IJ SOLN
INTRAMUSCULAR | Status: AC
  Filled 2023-10-16: qty 30

## 2023-10-16 MED ORDER — IOHEXOL 350 MG/ML SOLN
INTRAVENOUS | Status: DC | PRN
  Administered 2023-10-16: 150 mL

## 2023-10-16 MED ORDER — HEPARIN SODIUM (PORCINE) 5000 UNIT/ML IJ SOLN
4000.0000 [IU] | Freq: Once | INTRAMUSCULAR | Status: AC
  Administered 2023-10-16: 4000 [IU] via INTRAVENOUS

## 2023-10-16 MED ORDER — CHLORHEXIDINE GLUCONATE CLOTH 2 % EX PADS
6.0000 | MEDICATED_PAD | Freq: Every day | CUTANEOUS | Status: DC
  Administered 2023-10-16 – 2023-10-17 (×2): 6 via TOPICAL

## 2023-10-16 MED ORDER — FREE WATER: 500.0000 mL | Freq: Once | Status: DC

## 2023-10-16 MED ORDER — ONDANSETRON HCL 4 MG/2ML IJ SOLN
4.0000 mg | Freq: Once | INTRAMUSCULAR | Status: AC
  Administered 2023-10-16: 4 mg via INTRAVENOUS

## 2023-10-16 MED ORDER — INSULIN ASPART 100 UNIT/ML IJ SOLN
4.0000 [IU] | Freq: Three times a day (TID) | INTRAMUSCULAR | Status: DC
  Administered 2023-10-16 – 2023-10-18 (×7): 4 [IU] via SUBCUTANEOUS

## 2023-10-16 MED ORDER — HEPARIN SODIUM (PORCINE) 1000 UNIT/ML IJ SOLN
INTRAMUSCULAR | Status: DC | PRN
  Administered 2023-10-16: 6000 [IU] via INTRAVENOUS
  Administered 2023-10-16: 3000 [IU] via INTRAVENOUS
  Administered 2023-10-16: 4000 [IU] via INTRAVENOUS

## 2023-10-16 MED ORDER — MORPHINE SULFATE (PF) 4 MG/ML IV SOLN
4.0000 mg | Freq: Once | INTRAVENOUS | Status: AC
  Administered 2023-10-16: 4 mg via INTRAVENOUS
  Filled 2023-10-16: qty 1

## 2023-10-16 MED ORDER — SODIUM CHLORIDE 0.9 % IV SOLN: INTRAVENOUS | Status: AC

## 2023-10-16 MED ORDER — ATORVASTATIN CALCIUM 80 MG PO TABS
80.0000 mg | ORAL_TABLET | Freq: Every day | ORAL | Status: DC
  Administered 2023-10-16 – 2023-10-18 (×3): 80 mg via ORAL
  Filled 2023-10-16 (×3): qty 1

## 2023-10-16 MED ORDER — TICAGRELOR 90 MG PO TABS
90.0000 mg | ORAL_TABLET | Freq: Two times a day (BID) | ORAL | Status: DC
  Administered 2023-10-16 – 2023-10-18 (×5): 90 mg via ORAL
  Filled 2023-10-16 (×5): qty 1

## 2023-10-16 MED ORDER — ONDANSETRON HCL 4 MG/2ML IJ SOLN
INTRAMUSCULAR | Status: AC
  Filled 2023-10-16: qty 2

## 2023-10-16 MED ORDER — NITROGLYCERIN 1 MG/10 ML FOR IR/CATH LAB
INTRA_ARTERIAL | Status: DC | PRN
  Administered 2023-10-16: 150 ug via INTRACORONARY

## 2023-10-16 MED ORDER — SODIUM CHLORIDE 0.9% FLUSH: 3.0000 mL | INTRAVENOUS | Status: DC | PRN

## 2023-10-16 MED ORDER — INSULIN ASPART 100 UNIT/ML IJ SOLN
5.0000 [IU] | Freq: Once | INTRAMUSCULAR | Status: AC
  Administered 2023-10-16: 5 [IU] via INTRAVENOUS

## 2023-10-16 MED ORDER — HYDRALAZINE HCL 20 MG/ML IJ SOLN: 10.0000 mg | INTRAMUSCULAR | Status: AC | PRN

## 2023-10-16 MED ORDER — SODIUM CHLORIDE 0.9 % IV SOLN
INTRAVENOUS | Status: AC | PRN
  Administered 2023-10-16: 100 mL/h via INTRAVENOUS

## 2023-10-16 MED ORDER — LABETALOL HCL 5 MG/ML IV SOLN: 10.0000 mg | INTRAVENOUS | Status: AC | PRN

## 2023-10-16 MED ORDER — OXYMETAZOLINE HCL 0.05 % NA SOLN
1.0000 | Freq: Two times a day (BID) | NASAL | Status: DC
  Administered 2023-10-16 – 2023-10-17 (×4): 1 via NASAL
  Filled 2023-10-16: qty 30

## 2023-10-16 MED ORDER — INSULIN ASPART 100 UNIT/ML IJ SOLN
0.0000 [IU] | Freq: Three times a day (TID) | INTRAMUSCULAR | Status: DC
  Administered 2023-10-16: 8 [IU] via SUBCUTANEOUS
  Administered 2023-10-16: 5 [IU] via SUBCUTANEOUS
  Administered 2023-10-16: 11 [IU] via SUBCUTANEOUS
  Administered 2023-10-17: 8 [IU] via SUBCUTANEOUS
  Administered 2023-10-17 (×2): 3 [IU] via SUBCUTANEOUS
  Administered 2023-10-18: 5 [IU] via SUBCUTANEOUS

## 2023-10-16 MED ORDER — ASPIRIN 81 MG PO CHEW
324.0000 mg | CHEWABLE_TABLET | Freq: Once | ORAL | Status: AC
  Administered 2023-10-16: 324 mg via ORAL

## 2023-10-16 MED ORDER — FENTANYL CITRATE (PF) 100 MCG/2ML IJ SOLN
INTRAMUSCULAR | Status: AC
  Filled 2023-10-16: qty 2

## 2023-10-16 MED ORDER — ASPIRIN 81 MG PO CHEW
CHEWABLE_TABLET | ORAL | Status: AC
  Filled 2023-10-16: qty 4

## 2023-10-16 SURGICAL SUPPLY — 17 items
BALLOON EMERGE MR 2.5X12 (BALLOONS) IMPLANT
BALLOON ~~LOC~~ EMERGE MR 2.75X12 (BALLOONS) IMPLANT
BALLOON ~~LOC~~ EMERGE MR 2.75X15 (BALLOONS) IMPLANT
CATH 5FR JL3.5 JR4 ANG PIG MP (CATHETERS) IMPLANT
CATH INFINITI 5FR JL4 (CATHETERS) IMPLANT
CATH LAUNCHER 6FR EBU 3.5 SH (CATHETERS) IMPLANT
DEVICE RAD COMP TR BAND LRG (VASCULAR PRODUCTS) IMPLANT
GLIDESHEATH SLEND SS 6F .021 (SHEATH) IMPLANT
GUIDEWIRE INQWIRE 1.5J.035X260 (WIRE) IMPLANT
KIT ENCORE 26 ADVANTAGE (KITS) IMPLANT
KIT SYRINGE INJ CVI SPIKEX1 (MISCELLANEOUS) IMPLANT
PACK CARDIAC CATHETERIZATION (CUSTOM PROCEDURE TRAY) ×1 IMPLANT
SET ATX-X65L (MISCELLANEOUS) IMPLANT
STENT ONYX FRONTIER 2.5X12 (Permanent Stent) IMPLANT
WIRE ASAHI PROWATER 180CM (WIRE) IMPLANT
WIRE RUNTHROUGH .014X180CM (WIRE) IMPLANT
WIRE RUNTHROUGH IZANAI 014 180 (WIRE) IMPLANT

## 2023-10-16 NOTE — ED Provider Notes (Addendum)
 [place] EMERGENCY DEPARTMENT AT [facility] Provider Note   CSN: [id number] Arrival date & time: 10/16/23  9947     Patient presents with: Code STEMI   Jonathan Fischer is a 53 y.o. male.   HPI    53 y.o. male with h/o CAD s/p prior stenting, chronic systolic heart failure secondary to ischemic cardiomyopathy, VT, s/p ICD implantation, HTN, HLD presenting to the emergency department as a code STEMI.  The patient developed substernal crushing chest pain that started 1 hour ago after having sexual intercourse.  He took 3 sublingual nitroglycerin  at home prior to arrival with no improvement.  EMS attempted to administer 324 mg of aspirin  but he likely threw this up.  He was administered 4 mg of morphine .  Initial transmitted EKG showed ST segment elevations in V4 through V6 greater than 1 mm and code STEMI was called prior to patient arrival.  Patient continued to endorse chest pressure, endorses nausea and was diaphoretic on arrival.  No ripping or tearing chest pain with no radiation to the back.  Prior to Admission medications   Medication Sig Start Date End Date Taking? Authorizing Provider  aspirin  EC 81 MG EC tablet Take 1 tablet (81 mg total) by mouth daily. 12/14/11   Arguello, Roger A, PA-C  atorvastatin  (LIPITOR ) 80 MG tablet TAKE 1 TABLET EVERY DAY 02/19/22   Jordan, Peter M, MD  carvedilol  (COREG ) 12.5 MG tablet TAKE 1 TABLET TWICE DAILY 12/21/22   Jordan, Peter M, MD  clopidogrel  (PLAVIX ) 75 MG tablet Take 1 tablet (75 mg total) by mouth daily. 02/19/22   Jordan, Peter M, MD  glipiZIDE (GLUCOTROL XL) 5 MG 24 hr tablet Take 5 mg by mouth daily. with food 08/28/18   [provider]  ibuprofen (ADVIL) 200 MG tablet Take 800 mg by mouth daily as needed for moderate pain.    [provider]  insulin degludec (TRESIBA FLEXTOUCH) 100 UNIT/ML FlexTouch Pen Inject into the skin daily.    [provider]  JARDIANCE  25 MG TABS tablet Take 1 tablet (25 mg  total) by mouth daily. 02/19/22   Jordan, Peter M, MD  lisinopril  (ZESTRIL ) 10 MG tablet Take 1 tablet (10 mg total) by mouth daily. 02/19/22   Jordan, Peter M, MD  nitroGLYCERIN  (NITROSTAT ) 0.4 MG SL tablet Place 1 tablet (0.4 mg total) under the tongue every 5 (five) minutes as needed. For chest pain 10/07/20   Fernande Elspeth BROCKS, MD  oxymetazoline (AFRIN) 0.05 % nasal spray Place 1 spray into both nostrils 2 (two) times daily.    [provider]  pantoprazole  (PROTONIX ) 40 MG tablet TAKE 1 TABLET ONE TIME DAILY (NEED MD APPOINTMENT) 03/04/22   Fernande Elspeth BROCKS, MD  spironolactone  (ALDACTONE ) 25 MG tablet TAKE 1/2 TABLET EVERY DAY 12/21/22   Jordan, Peter M, MD    Allergies: Patient has no known allergies.    Review of Systems  All other systems reviewed and are negative.   Updated Vital Signs BP 121/81   Pulse 62   Temp (!) 96.6 F (35.9 C) (Temporal)   Resp 14   Ht 6' 1 (1.854 m)   Wt 90.7 kg   SpO2 99%   BMI 26.39 kg/m   Physical Exam Vitals and nursing note reviewed.  Constitutional:      Appearance: He is ill-appearing and diaphoretic.  HENT:     Head: Normocephalic and atraumatic.  Eyes:     Conjunctiva/sclera: Conjunctivae normal.  Pupils: Pupils are equal, round, and reactive to light.  Cardiovascular:     Rate and Rhythm: Normal rate and regular rhythm.  Pulmonary:     Effort: Pulmonary effort is normal. No respiratory distress.     Breath sounds: Normal breath sounds.  Abdominal:     General: There is no distension.     Tenderness: There is no guarding.  Musculoskeletal:        General: No deformity or signs of injury.     Cervical back: Neck supple.  Skin:    Findings: No lesion or rash.  Neurological:     General: No focal deficit present.     Mental Status: He is alert. Mental status is at baseline.     (all labs ordered are listed, but only abnormal results are displayed) Labs Reviewed  I-STAT CG4 LACTIC ACID, ED - Abnormal; Notable for the  following components:      Result Value   Lactic Acid, Venous 2.6 (*)    All other components within normal limits  HEMOGLOBIN A1C  CBC WITH DIFFERENTIAL/PLATELET  PROTIME-INR  APTT  COMPREHENSIVE METABOLIC PANEL WITH GFR  LIPID PANEL  TROPONIN I (HIGH SENSITIVITY)    EKG: EKG Interpretation Date/Time:  "Sunday October 16 2023 00:59:28 EDT Ventricular Rate:  60 PR Interval:    QRS Duration:  105 QT Interval:  471 QTC Calculation: 471 R Axis:   88  Text Interpretation: Sinus rhythm Inferolateral infarct, acute Anterior infarct, acute (LAD) STEMI Confirmed by Johniya Durfee ([id number]) on 10/16/2023 1:10:22 AM  Radiology: No results found.   .Critical Care  Performed by: Yvan Dority, MD Authorized by: Terryn Redner, MD   Critical care provider statement:    Critical care time (minutes):  30   Critical care was time spent personally by me on the following activities:  Development of treatment plan with patient or surrogate, discussions with consultants, evaluation of patient's response to treatment, examination of patient, ordering and review of laboratory studies, ordering and review of radiographic studies, ordering and performing treatments and interventions, pulse oximetry, re-evaluation of patient's condition and review of old charts   Care discussed with: admitting provider      Medications Ordered in the ED  0.9 %  sodium chloride infusion (has no administration in time range)  aspirin chewable tablet 324 mg (324 mg Oral Given 10/16/23 0103)  heparin injection 4,000 Units (0 Units Intravenous Hold 10/16/23 0103)  ondansetron (ZOFRAN) injection 4 mg (0 mg Intravenous Hold 10/16/23 0104)  morphine (PF) 4 MG/ML injection 4 mg (4 mg Intravenous Given 10/16/23 0103)                                    Medical Decision Making Amount and/or Complexity of Data Reviewed Labs: ordered. Radiology: ordered.  Risk OTC drugs. Prescription drug management. Decision regarding  hospitalization.      52"  y.o. male with h/o CAD s/p prior stenting, chronic systolic heart failure secondary to ischemic cardiomyopathy, VT, s/p ICD implantation, HTN, HLD presenting to the emergency department as a code STEMI.  The patient developed substernal crushing chest pain that started 1 hour ago after having sexual intercourse.  He took 3 sublingual nitroglycerin  at home prior to arrival with no improvement.  EMS attempted to administer 324 mg of aspirin  but he likely threw this up.  He was administered 4 mg of morphine .  Initial transmitted EKG showed ST segment  elevations in V4 through V6 greater than 1 mm and code STEMI was called prior to patient arrival.  Patient continued to endorse chest pressure, endorses nausea and was diaphoretic on arrival.  No ripping or tearing chest pain with no radiation to the back.  On arrival, the patient was afebrile, not tachycardic or tachypneic, BP 121/81, saturating 98% on room air.  Physical exam as per above revealed a diaphoretic ill-appearing male, clear lungs to auscultation.  Patient without ripping tearing chest pain radiating to the back, history of multiple stents placed previously presenting as code STEMI.  EKG confirmed STEMI on arrival.   The patient was administered oral aspirin  324 mg, Zofran  4 mg, heparin  bolus 4000 units, additional 4 mg of morphine .  The patient was subsequently transported to the Cath Lab in critical condition.     Final diagnoses:  STEMI (ST elevation myocardial infarction) [facility])    ED Discharge Orders     None          Jerrol Agent, MD 10/16/23 CATHRINE    Jerrol Agent, MD 11/14/23 1601

## 2023-10-16 NOTE — Progress Notes (Signed)
   10/16/23 0114  Spiritual Encounters  Type of Visit Initial  Reason for visit Code  OnCall Visit Yes   Chaplain responded to Code STEMI. Patient worked on in [employer] and then taken from room for further testing. No family was present. Chaplain available upon request.  Chaplain Hobert Poplaski

## 2023-10-16 NOTE — Progress Notes (Signed)
 Cardiology Brief Note  Patient with improved chest pain this AM. TR band still on. Patient with brief NSVT run, asymptomatic.  Cont DAPT Keep in 2H. Echo pending If recurrent/worsening VT, will add amio.  Ole T. Cindie, MD, [facility], [facility] Cardiac Electrophysiology

## 2023-10-16 NOTE — ED Triage Notes (Signed)
 Arrives [facility] urgently from home as a CODE STEMI with elevation to v4, v5, v6.   Sudden central crushing chest pain that manifested 1 hour ago after having sexual intercourse.   3x SL nit.ro from home prior to arrival with no improvement. 324mg  ASA that he may have vomited up.   EMS administered 4mg  Morphine .   Hx of 7 MI's and 4 stents previously placed.

## 2023-10-16 NOTE — H&P (Signed)
 Cardiology Admission History and Physical   Patient ID: Jonathan Fischer MRN: [id number]; DOB: 21-Aug-1970   Admission date: 10/16/2023  PCP:  Gerome Brunet, DO   [facility] Providers Cardiologist:  Peter Swaziland, MD  Electrophysiologist:  Fonda Kitty, MD      Chief Complaint:  Chest Pain  Patient Profile: Jonathan Fischer is a 53 y.o. male with CAD status post PCI to the RCA, LAD, left circumflex with most recent PCI in 2013, ischemic cardiomyopathy with EF 30%, hypertension, hyperlipidemia, ventricular tachycardia status post ICD who is being seen 10/16/2023 for the evaluation of chest pain.  History of Present Illness: Jonathan Fischer called EMS after acute onset substernal chest pressure that occurred after having sex with his wife.  He attempted to improve his chest discomfort which was rated at a 10 out of 10 with multiple doses of sublingual nitroglycerin  which did not improve his symptoms.  Upon arrival by EMS his ECG showed inferior ST elevations.  He was treated by EMS with 4 mg of IV morphine .  He attempted to take 325 mg of aspirin  but unfortunately vomited this up.  Subsequent ECG showed increased ST elevations in the inferior and anterolateral leads.  He has a long history of CAD with multiple stents in each of his 3 coronary arteries.  His last heart cath was in 2020 which showed distal LAD lesion of 85%, patent stents in the proximal and mid RCA, proximal to mid circumflex, proximal to mid LAD.  He reports daily adherence to his aspirin  and clopidogrel  medications.   Past Medical History:  Diagnosis Date   Abnormal echocardiogram    a. 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   AICD (automatic cardioverter/defibrillator) present 11/2009   Arthritis    AUTOMATIC IMPLANTABLE CARDIAC DEFIBRILLATOR SITU 03/03/2010   Qualifier: Diagnosis of  By: Fernande,  MD, [facility], Elspeth Darner    CAD (coronary artery disease)    s/p prior LAD, LCx and RCA stenting remotely for an anterior MI, recurrent MI in 2010 with LAD stent occlusion s/p thrombectomy and PTCA, STEMI 12/2011 s/p DES-prox LAD   Cardiomyopathy, ischemic 12/14/2011   Chronic systolic CHF (congestive heart failure) (HCC)    Headache    migraines   HTN (hypertension)    Hyperlipidemia    ICD (implantable cardiac defibrillator) in place    Ischemic cardiomyopathy    EF 25-30% s/p single-chamber Medtronic ICD    NSVT (nonsustained ventricular tachycardia) (HCC) 12/14/2011   Obesity    STEMI (ST elevation myocardial infarction) (HCC) 12/12/2011   Tobacco abuse    TOBACCO USER 10/29/2009   Qualifier: Diagnosis of  By: Fernande, MD, CODY Elspeth Darner    Tooth pain 12/14/2011   VT (ventricular tachycardia) (HCC) 10/18/2012   Past Surgical History:  Procedure Laterality Date   CARDIAC CATHETERIZATION  03/31/2009   CARDIAC CATHETERIZATION  05/08/2008   CARDIAC SIZE AND SILHOUETTE NORMAL. THERE WAS ANTERIOR APICAL HYPOKINESIS WITH EF 35-40%   CARDIAC CATHETERIZATION  03/14/2018   CARDIAC DEFIBRILLATOR PLACEMENT  11/2009   CORONARY ANGIOPLASTY  07/19/2016   BALLOON ANGIOPLASTY TO THE LAD WITH THROMBUS EXTRACTION OF THE PREVIOUSLY STENTED SEGMENT AND NEW STENT PLACEMENT TO THE LEFT CIRCUMFLEX. EF IS DOWN IN THE 30% RANGE   CORONARY ANGIOPLASTY WITH STENT PLACEMENT  12/12/2011   30% mid LCx, mid RCA & mid LCx stents patent. LVEF 25-30%, mid-distal anterolateral wall AK,  apex dilated, dyskinetic s/p DES-prox LAD stenosis.   CORONARY BALLOON ANGIOPLASTY N/A 07/19/2016   Procedure: Coronary Balloon Angioplasty;  Surgeon: Swaziland, Peter M, MD;  Location: [facility] INVASIVE CV LAB;  Service: Cardiovascular;  Laterality: N/A;   defib     HAND SURGERY  1994   right hand   ICD GENERATOR CHANGEOUT N/A 05/19/2018   Procedure: ICD GENERATOR CHANGEOUT;  Surgeon: Inocencio Soyla Lunger, MD;  Location: [facility] INVASIVE CV LAB;   Service: Cardiovascular;  Laterality: N/A;   LEFT HEART CATH Bilateral 12/12/2011   Procedure: LEFT HEART CATH;  Surgeon: Peter M Swaziland, MD;  Location: [facility] CATH LAB;  Service: Cardiovascular;  Laterality: Bilateral;   LEFT HEART CATH AND CORONARY ANGIOGRAPHY N/A 07/19/2016   Procedure: Left Heart Cath and Coronary Angiography;  Surgeon: Swaziland, Peter M, MD;  Location: [facility] INVASIVE CV LAB;  Service: Cardiovascular;  Laterality: N/A;   LEFT HEART CATH AND CORONARY ANGIOGRAPHY N/A 03/14/2018   Procedure: LEFT HEART CATH AND CORONARY ANGIOGRAPHY;  Surgeon: Swaziland, Peter M, MD;  Location: [facility] INVASIVE CV LAB;  Service: Cardiovascular;  Laterality: N/A;     Medications Prior to Admission: Prior to Admission medications   Medication Sig Start Date End Date Taking? Authorizing Provider  aspirin  EC 81 MG EC tablet Take 1 tablet (81 mg total) by mouth daily. 12/14/11   Arguello, Roger A, PA-C  atorvastatin  (LIPITOR ) 80 MG tablet TAKE 1 TABLET EVERY DAY 02/19/22   Swaziland, Peter M, MD  carvedilol  (COREG ) 12.5 MG tablet TAKE 1 TABLET TWICE DAILY 12/21/22   Swaziland, Peter M, MD  clopidogrel  (PLAVIX ) 75 MG tablet Take 1 tablet (75 mg total) by mouth daily. 02/19/22   Swaziland, Peter M, MD  glipiZIDE (GLUCOTROL XL) 5 MG 24 hr tablet Take 5 mg by mouth daily. with food 08/28/18   [provider]  ibuprofen (ADVIL) 200 MG tablet Take 800 mg by mouth daily as needed for moderate pain.    [provider]  insulin degludec (TRESIBA FLEXTOUCH) 100 UNIT/ML FlexTouch Pen Inject into the skin daily.    [provider]  JARDIANCE  25 MG TABS tablet Take 1 tablet (25 mg total) by mouth daily. 02/19/22   Swaziland, Peter M, MD  lisinopril  (ZESTRIL ) 10 MG tablet Take 1 tablet (10 mg total) by mouth daily. 02/19/22   Swaziland, Peter M, MD  nitroGLYCERIN  (NITROSTAT ) 0.4 MG SL tablet Place 1 tablet (0.4 mg total) under the tongue every 5 (five) minutes as needed. For chest pain 10/07/20   Fernande Elspeth BROCKS, MD  oxymetazoline  (AFRIN) 0.05 % nasal spray Place 1 spray into both nostrils 2 (two) times daily.    [provider]  pantoprazole  (PROTONIX ) 40 MG tablet TAKE 1 TABLET ONE TIME DAILY (NEED MD APPOINTMENT) 03/04/22   Fernande Elspeth BROCKS, MD  spironolactone  (ALDACTONE ) 25 MG tablet TAKE 1/2 TABLET EVERY DAY 12/21/22   Swaziland, Peter M, MD     Allergies:   No Known Allergies  Social History:   Social History   Socioeconomic History   Marital status: Married    Spouse name: Not on file   Number of children: Not on file   Years of education: Not on file   Highest education level: Not on file  Occupational History   Not on file  Tobacco Use   Smoking status: Every Day    Current packs/day: 1.00    Average packs/day: 1 pack/day for 24.0 years (24.0 ttl pk-yrs)    Types: E-cigarettes, Cigarettes  Smokeless tobacco: Never  Vaping Use   Vaping status: Never Used  Substance and Sexual Activity   Alcohol use: Yes    Comment: Occassionally   Drug use: No   Sexual activity: Yes  Other Topics Concern   Not on file  Social History Narrative   Not on file   Social Drivers of Health   Financial Resource Strain: Not on file  Food Insecurity: Not on file  Transportation Needs: Not on file  Physical Activity: Not on file  Stress: Not on file  Social Connections: Not on file  Intimate Partner Violence: Not on file     Family History:   The patient's family history includes Cancer in his mother; Heart disease in his father.    ROS:  Please see the history of present illness.  All other ROS reviewed and negative.     Physical Exam/Data: Vitals:   10/16/23 0057 10/16/23 0058 10/16/23 0100  BP:   121/81  Pulse:   62  Resp:   14  Temp: (!) 96.6 F (35.9 C)    TempSrc: Temporal    SpO2:  98% 99%  Weight:  90.7 kg   Height:  6' 1 (1.854 m)    No intake or output data in the 24 hours ending 10/16/23 0126    10/16/2023   12:58 AM 07/21/2023    2:23 PM 02/19/2022    1:56 PM  Last 3 Weights   Weight (lbs) 200 lb 209 lb 12.8 oz 244 lb 9.6 oz  Weight (kg) 90.719 kg 95.165 kg 110.95 kg     Body mass index is 26.39 kg/m.  General:  Diaphoretic, moderate distress HEENT: normal Neck: no JVD Vascular: No carotid bruits; Distal pulses 2+ bilaterally   Cardiac:  normal S1, S2; RRR; no murmur Lungs:  clear to auscultation bilaterally, no wheezing, rhonchi or rales  Abd: soft, nontender, no hepatomegaly  Ext: no edema Musculoskeletal:  No deformities, BUE and BLE strength normal and equal Skin: warm and dry  Neuro:  CNs 2-12 intact, no focal abnormalities noted Psych:  Normal affect   EKG:  The ECG that was done and was personally reviewed and demonstrates NSR w/ inferior, anterior, and anterolateral acute infarctions.  Relevant CV Studies: Cath 2020  Dist LAD lesion is 85% stenosed. Prox Cx lesion is 30% stenosed. Balloon angioplasty was performed. Previously placed Prox RCA to Mid RCA stent (unknown type) is widely patent. Previously placed Mid RCA stent (unknown type) is widely patent. Previously placed Prox Cx to Mid Cx stent (unknown type) is widely patent. Previously placed Prox LAD to Mid LAD stent (unknown type) is widely patent. There is severe left ventricular systolic dysfunction. LV end diastolic pressure is normal. The left ventricular ejection fraction is 25-35% by visual estimate.   1. Single vessel obstructive CAD involving the very distal LAD at the apex. All prior stents are widely patent.  2. Severe LV dysfunction. EF estimated at 25-30% . 3. Normal LVEDP  Echo 10/2012:  Study Conclusions   - Left ventricle: The cavity size was moderately dilated.    Systolic function was moderately to severely reduced. The    estimated ejection fraction was in the range of 30% to    35%. Akinesis of the mid-distalanteroseptal myocardium.  - Left atrium: The atrium was mildly to moderately dilated.  - Right ventricle: Systolic function was moderately reduced.    Laboratory Data: High Sensitivity Troponin:  No results for input(s): TROPONINIHS in the last 720 hours.  ChemistryNo results for input(s): NA, K, CL, CO2, GLUCOSE, BUN, CREATININE, CALCIUM , MG, GFRNONAA, GFRAA, ANIONGAP in the last 168 hours.  No results for input(s): PROT, ALBUMIN, AST, ALT, ALKPHOS, BILITOT in the last 168 hours. Lipids No results for input(s): CHOL, TRIG, HDL, LABVLDL, LDLCALC, CHOLHDL in the last 168 hours. HematologyNo results for input(s): WBC, RBC, HGB, HCT, MCV, MCH, MCHC, RDW, PLT in the last 168 hours. Thyroid  No results for input(s): TSH, FREET4 in the last 168 hours. BNPNo results for input(s): BNP, PROBNP in the last 168 hours.  DDimer No results for input(s): DDIMER in the last 168 hours.  Radiology/Studies:  No results found.   Assessment and Plan: STEMI - Inferior, anterior, anterolateral ST elevations consistent with acute infarct. - Atorvastatin  80 mg daily - Restart home BB, Ace, SGLT2i, MRA pending blood pressure post-procedure - Echo ordered - ASA, [place] maintenance dosing ordered 2.   Chronic HFrEF - restart GDMT as able, Echo pending 3.   VT s/p ICD - monitor post-procedurally  Risk Assessment/Risk Scores:   TIMI Risk Score for ST  Elevation MI:   The patient's TIMI risk score is 2, which indicates a 2.2% risk of all cause mortality at 30 days.      Code Status: Full Code  Severity of Illness: The appropriate patient status for this patient is INPATIENT. Inpatient status is judged to be reasonable and necessary in order to provide the required intensity of service to ensure the patient's safety. The patient's presenting symptoms, physical exam findings, and initial radiographic and laboratory data in the context of their chronic comorbidities is felt to place them at high risk for further clinical deterioration. Furthermore, it is not anticipated that the  patient will be medically stable for discharge from the hospital within 2 midnights of admission.   * I certify that at the point of admission it is my clinical judgment that the patient will require inpatient hospital care spanning beyond 2 midnights from the point of admission due to high intensity of service, high risk for further deterioration and high frequency of surveillance required.*  For questions or updates, please contact [facility] Please consult [email or web address] for contact info under       Signed, Jalaine DELENA Newcomer, MD  10/16/2023 1:26 AM

## 2023-10-17 ENCOUNTER — Other Ambulatory Visit (HOSPITAL_COMMUNITY): Payer: Self-pay

## 2023-10-17 ENCOUNTER — Telehealth (HOSPITAL_COMMUNITY): Payer: Self-pay | Admitting: Pharmacy Technician

## 2023-10-17 ENCOUNTER — Inpatient Hospital Stay (HOSPITAL_COMMUNITY): Admission: EM | Disposition: A | Payer: Self-pay | Source: Home / Self Care | Attending: Cardiovascular Disease

## 2023-10-17 DIAGNOSIS — F17210 Nicotine dependence, cigarettes, uncomplicated: Secondary | ICD-10-CM | POA: Diagnosis not present

## 2023-10-17 DIAGNOSIS — E119 Type 2 diabetes mellitus without complications: Secondary | ICD-10-CM

## 2023-10-17 DIAGNOSIS — I255 Ischemic cardiomyopathy: Secondary | ICD-10-CM | POA: Diagnosis not present

## 2023-10-17 DIAGNOSIS — I2510 Atherosclerotic heart disease of native coronary artery without angina pectoris: Secondary | ICD-10-CM | POA: Diagnosis not present

## 2023-10-17 DIAGNOSIS — Z794 Long term (current) use of insulin: Secondary | ICD-10-CM

## 2023-10-17 DIAGNOSIS — I2121 ST elevation (STEMI) myocardial infarction involving left circumflex coronary artery: Secondary | ICD-10-CM | POA: Diagnosis not present

## 2023-10-17 HISTORY — PX: CORONARY STENT INTERVENTION: CATH118234

## 2023-10-17 LAB — CBC
HCT: 50 % (ref 39.0–52.0)
Hemoglobin: 17.9 g/dL — ABNORMAL HIGH (ref 13.0–17.0)
MCH: 31.6 pg (ref 26.0–34.0)
MCHC: 35.8 g/dL (ref 30.0–36.0)
MCV: 88.2 fL (ref 80.0–100.0)
Platelets: 219 K/uL (ref 150–400)
RBC: 5.67 MIL/uL (ref 4.22–5.81)
RDW: 12.3 % (ref 11.5–15.5)
WBC: 12.4 K/uL — ABNORMAL HIGH (ref 4.0–10.5)
nRBC: 0 % (ref 0.0–0.2)

## 2023-10-17 LAB — GLUCOSE, CAPILLARY
Glucose-Capillary: 273 mg/dL — ABNORMAL HIGH (ref 70–99)
Glucose-Capillary: 153 mg/dL — ABNORMAL HIGH (ref 70–99)
Glucose-Capillary: 166 mg/dL — ABNORMAL HIGH (ref 70–99)
Glucose-Capillary: 237 mg/dL — ABNORMAL HIGH (ref 70–99)

## 2023-10-17 LAB — BASIC METABOLIC PANEL WITH GFR
Anion gap: 11 (ref 5–15)
BUN: 18 mg/dL (ref 6–20)
CO2: 23 mmol/L (ref 22–32)
Calcium: 9.2 mg/dL (ref 8.9–10.3)
Chloride: 102 mmol/L (ref 98–111)
Creatinine, Ser: 0.66 mg/dL (ref 0.61–1.24)
GFR, Estimated: >60 mL/min (ref >60)
Glucose, Bld: 136 mg/dL — ABNORMAL HIGH (ref 70–99)
Potassium: 3.8 mmol/L (ref 3.5–5.1)
Sodium: 136 mmol/L (ref 135–145)

## 2023-10-17 LAB — PATHOLOGIST SMEAR REVIEW

## 2023-10-17 LAB — POCT ACTIVATED CLOTTING TIME
Activated Clotting Time: 331 s
Activated Clotting Time: 423 s

## 2023-10-17 LAB — LIPOPROTEIN A (LPA): Lipoprotein (a): 9.5 nmol/L (ref <75.0)

## 2023-10-17 SURGERY — CORONARY STENT INTERVENTION
Anesthesia: LOCAL

## 2023-10-17 MED ORDER — SODIUM CHLORIDE 0.9 % IV SOLN: 250.0000 mL | INTRAVENOUS | Status: DC | PRN

## 2023-10-17 MED ORDER — VERAPAMIL HCL 2.5 MG/ML IV SOLN
INTRAVENOUS | Status: AC
  Filled 2023-10-17: qty 2

## 2023-10-17 MED ORDER — LIDOCAINE HCL (PF) 1 % IJ SOLN
INTRAMUSCULAR | Status: AC
  Filled 2023-10-17: qty 30

## 2023-10-17 MED ORDER — LIDOCAINE HCL (PF) 1 % IJ SOLN
INTRAMUSCULAR | Status: DC | PRN
  Administered 2023-10-17: 2 mL

## 2023-10-17 MED ORDER — FREE WATER
500.0000 mL | Freq: Once | Status: AC
  Administered 2023-10-17: 500 mL via ORAL

## 2023-10-17 MED ORDER — HEPARIN SODIUM (PORCINE) 1000 UNIT/ML IJ SOLN
INTRAMUSCULAR | Status: AC
  Filled 2023-10-17: qty 10

## 2023-10-17 MED ORDER — SODIUM CHLORIDE 0.9% FLUSH: 3.0000 mL | INTRAVENOUS | Status: DC | PRN

## 2023-10-17 MED ORDER — VERAPAMIL HCL 2.5 MG/ML IV SOLN
INTRAVENOUS | Status: DC | PRN
  Administered 2023-10-17: 10 mL via INTRA_ARTERIAL

## 2023-10-17 MED ORDER — AMIODARONE HCL 150 MG/3ML IV SOLN
INTRAVENOUS | Status: AC
  Filled 2023-10-17: qty 3

## 2023-10-17 MED ORDER — MIDAZOLAM HCL 2 MG/2ML IJ SOLN
INTRAMUSCULAR | Status: AC
  Filled 2023-10-17: qty 2

## 2023-10-17 MED ORDER — SACUBITRIL-VALSARTAN 24-26 MG PO TABS
1.0000 | ORAL_TABLET | Freq: Two times a day (BID) | ORAL | Status: DC
  Administered 2023-10-17 – 2023-10-18 (×2): 1 via ORAL
  Filled 2023-10-17 (×2): qty 1

## 2023-10-17 MED ORDER — SPIRONOLACTONE 12.5 MG HALF TABLET
12.5000 mg | ORAL_TABLET | Freq: Every day | ORAL | Status: DC
Start: 2023-10-17 — End: 2023-10-18
  Administered 2023-10-17 – 2023-10-18 (×2): 12.5 mg via ORAL
  Filled 2023-10-17 (×2): qty 1

## 2023-10-17 MED ORDER — AMIODARONE HCL IN DEXTROSE 360-4.14 MG/200ML-% IV SOLN: 30.0000 mg/h | INTRAVENOUS | Status: DC

## 2023-10-17 MED ORDER — EMPAGLIFLOZIN 10 MG PO TABS
10.0000 mg | ORAL_TABLET | Freq: Every day | ORAL | Status: DC
  Administered 2023-10-17 – 2023-10-18 (×2): 10 mg via ORAL
  Filled 2023-10-17 (×2): qty 1

## 2023-10-17 MED ORDER — VERAPAMIL HCL 2.5 MG/ML IV SOLN
INTRAVENOUS | Status: AC
Start: 2023-10-17 — End: 2023-10-17
  Filled 2023-10-17: qty 2

## 2023-10-17 MED ORDER — HEPARIN SODIUM (PORCINE) 1000 UNIT/ML IJ SOLN
INTRAMUSCULAR | Status: DC | PRN
  Administered 2023-10-17: 12000 [IU] via INTRAVENOUS

## 2023-10-17 MED ORDER — FENTANYL CITRATE (PF) 100 MCG/2ML IJ SOLN
INTRAMUSCULAR | Status: AC
  Filled 2023-10-17: qty 2

## 2023-10-17 MED ORDER — NITROGLYCERIN 1 MG/10 ML FOR IR/CATH LAB
INTRA_ARTERIAL | Status: AC
  Filled 2023-10-17: qty 10

## 2023-10-17 MED ORDER — MIDAZOLAM HCL 2 MG/2ML IJ SOLN
INTRAMUSCULAR | Status: DC | PRN
  Administered 2023-10-17: 2 mg via INTRAVENOUS
  Administered 2023-10-17: 1 mg via INTRAVENOUS

## 2023-10-17 MED ORDER — HEPARIN (PORCINE) IN NACL 1000-0.9 UT/500ML-% IV SOLN
INTRAVENOUS | Status: DC | PRN
  Administered 2023-10-17: 1000 mL

## 2023-10-17 MED ORDER — SODIUM CHLORIDE 0.9% FLUSH
3.0000 mL | Freq: Two times a day (BID) | INTRAVENOUS | Status: DC
  Administered 2023-10-17 – 2023-10-18 (×2): 3 mL via INTRAVENOUS

## 2023-10-17 MED ORDER — LABETALOL HCL 5 MG/ML IV SOLN: 10.0000 mg | INTRAVENOUS | Status: AC | PRN

## 2023-10-17 MED ORDER — METOPROLOL TARTRATE 5 MG/5ML IV SOLN
INTRAVENOUS | Status: DC | PRN
Start: 2023-10-17 — End: 2023-10-17
  Administered 2023-10-17: 5 mg via INTRAVENOUS

## 2023-10-17 MED ORDER — FENTANYL CITRATE (PF) 100 MCG/2ML IJ SOLN
INTRAMUSCULAR | Status: DC | PRN
  Administered 2023-10-17: 50 ug via INTRAVENOUS
  Administered 2023-10-17: 25 ug via INTRAVENOUS

## 2023-10-17 MED ORDER — AMIODARONE HCL 150 MG/3ML IV SOLN
INTRAVENOUS | Status: DC | PRN
  Administered 2023-10-17 (×2): 150 mg via INTRAVENOUS

## 2023-10-17 MED ORDER — NITROGLYCERIN 1 MG/10 ML FOR IR/CATH LAB
INTRA_ARTERIAL | Status: DC | PRN
  Administered 2023-10-17: 150 ug

## 2023-10-17 MED ORDER — IOHEXOL 350 MG/ML SOLN
INTRAVENOUS | Status: DC | PRN
  Administered 2023-10-17: 50 mL

## 2023-10-17 MED ORDER — HYDRALAZINE HCL 20 MG/ML IJ SOLN: 10.0000 mg | INTRAMUSCULAR | Status: AC | PRN

## 2023-10-17 MED ORDER — INSULIN GLARGINE 100 UNIT/ML ~~LOC~~ SOLN
20.0000 [IU] | Freq: Every day | SUBCUTANEOUS | Status: DC
Start: 2023-10-17 — End: 2023-10-18
  Administered 2023-10-17 – 2023-10-18 (×2): 20 [IU] via SUBCUTANEOUS
  Filled 2023-10-17 (×2): qty 0.2

## 2023-10-17 MED ORDER — AMIODARONE HCL IN DEXTROSE 360-4.14 MG/200ML-% IV SOLN: 60.0000 mg/h | INTRAVENOUS | Status: DC

## 2023-10-17 MED ORDER — LIDOCAINE HCL (PF) 1 % IJ SOLN
INTRAMUSCULAR | Status: AC
Start: 2023-10-17 — End: 2023-10-17
  Filled 2023-10-17: qty 30

## 2023-10-17 SURGICAL SUPPLY — 12 items
BALLOON EMERGE MR 2.5X12 (BALLOONS) IMPLANT
BALLOON SAPPHIRE NC24 3.75X12 (BALLOONS) IMPLANT
CATH LAUNCHER 6FR JR4 (CATHETERS) IMPLANT
CATH VISTA GUIDE 6FR XBLAD4 (CATHETERS) IMPLANT
DEVICE RAD COMP TR BAND LRG (VASCULAR PRODUCTS) IMPLANT
GLIDESHEATH SLEND SS 6F .021 (SHEATH) IMPLANT
GUIDEWIRE INQWIRE 1.5J.035X260 (WIRE) IMPLANT
KIT ENCORE 26 ADVANTAGE (KITS) IMPLANT
SET ATX-X65L (MISCELLANEOUS) IMPLANT
SHEATH PROBE COVER 6X72 (BAG) IMPLANT
STENT SYNERGY XD 3.50X16 (Permanent Stent) IMPLANT
WIRE RUNTHROUGH .014X180CM (WIRE) IMPLANT

## 2023-10-17 NOTE — Interval H&P Note (Signed)
 History and Physical Interval Note:  10/17/2023 3:07 PM  Jonathan Fischer  has presented today for surgery, with the diagnosis of cad.  The various methods of treatment have been discussed with the patient and family. After consideration of risks, benefits and other options for treatment, the patient has consented to  Procedure(s): CORONARY STENT INTERVENTION (N/A) as a surgical intervention.  The patient's history has been reviewed, patient examined, no change in status, stable for surgery.  I have reviewed the patient's chart and labs.  Questions were answered to the patient's satisfaction.     Ozell Fell

## 2023-10-17 NOTE — Telephone Encounter (Signed)
 Patient Product/process development scientist completed.    The patient is insured through [employer]. Patient has Medicare and is not eligible for a copay card, but may be able to apply for patient assistance or Medicare RX Payment Plan (Patient Must reach out to their plan, if eligible for payment plan), if available.    Ran test claim for Tresiba Pen and the current 30 day co-pay is $35.00.  Ran test claim for [employer] and Requires Prior Authorization  This test claim was processed through [employer]- copay amounts may vary at other pharmacies due to [employer], or as the patient moves through the different stages of their insurance plan.     Reyes Sharps, CPHT Pharmacy Technician Patient Advocate Specialist Lead [facility] Patient Advocate Team Direct Number: [phone]  Fax: [phone]

## 2023-10-17 NOTE — Telephone Encounter (Signed)
 Pharmacy Patient Advocate Encounter  Received notification from [employer] that Prior Authorization for Freestyle Libre 3 Plus Sensor has been APPROVED from 10/17/2023 to 01/10/2025. Ran test claim, Copay is $29.13. This test claim was processed through [facility]- copay amounts may vary at other pharmacies due to pharmacy/plan contracts, or as the patient moves through the different stages of their insurance plan.   PA #/Case ID/Reference #: [id number]

## 2023-10-17 NOTE — Progress Notes (Signed)
 Ambulation deferred d/t pending procedure today.   Patient received MI booklet and education regarding risk factors, restrictions, antiplatelet medications, HH diet, exercise guidelines, and when to call 911. Patient appears to be uninterested in smoking cessation, and CRP2 at [facility].   Concluded session with well wishes for a successful procedure, and speedy recovery.   Isaiah JAYSON Liverpool, RN  10/17/2023 9:56 AM

## 2023-10-17 NOTE — Telephone Encounter (Signed)
 Patient Product/process development scientist completed.    The patient is insured through [employer]. Patient has Medicare and is not eligible for a copay card, but may be able to apply for patient assistance or Medicare RX Payment Plan (Patient Must reach out to their plan, if eligible for payment plan), if available.    Ran test claim for ticagrelor (Brilinta) 90 mgand the current 30 day co-pay is $21.21.   This test claim was processed through [facility]- copay amounts may vary at other pharmacies due to pharmacy/plan contracts, or as the patient moves through the different stages of their insurance plan.     Reyes Sharps, CPHT Pharmacy Technician Patient Advocate Specialist Lead [facility] Patient Advocate Team Direct Number: [phone]  Fax: [phone]

## 2023-10-17 NOTE — Progress Notes (Signed)
 Patient Name: Jonathan Fischer Date of Encounter: 10/17/2023 [facility] Cardiologist: Jonathan Swaziland, MD   Interval Summary  .    No chest pain this morning Occasional PAC, PVC this morning  Vital Signs .    Vitals:   10/17/23 0500 10/17/23 0600 10/17/23 0639 10/17/23 0808  BP: 130/70 122/67    Pulse: (!) 55 62    Resp:      Temp:   98.5 F (36.9 C) 98.3 F (36.8 C)  TempSrc:   Oral Oral  SpO2: 95% 95%    Weight:      Height:        Intake/Output Summary (Last 24 hours) at 10/17/2023 0827 Last data filed at 10/16/2023 1300 Gross per 24 hour  Intake 284.67 ml  Output --  Net 284.67 ml      10/16/2023    2:30 AM 10/16/2023   12:58 AM 07/21/2023    2:23 PM  Last 3 Weights  Weight (lbs) 211 lb 13.8 oz 200 lb 209 lb 12.8 oz  Weight (kg) 96.1 kg 90.719 kg 95.165 kg      Telemetry/ECG    10/17/2023 - Personally Reviewed Occasional PVC's  EKG 10/17/2023: Sinus rhythm PAC's with aberrant conduction Anterolateral infarct, age indeterminate  Echocardiogram 10/16/2023:  1. Left ventricular ejection fraction, by estimation, is 30 to 35%. The  left ventricle has moderate to severely decreased function. The left  ventricle demonstrates regional wall motion abnormalities (see scoring  diagram/findings for description). The  left ventricular internal cavity size was severely dilated. Left  ventricular diastolic parameters are consistent with Grade Fischer diastolic  dysfunction (pseudonormalization).   2. Right ventricular systolic function is normal. The right ventricular  size is moderately enlarged.   3. The mitral valve is normal in structure. Mild mitral valve  regurgitation.   4. The aortic valve is normal in structure. Aortic valve regurgitation is  mild.   5. There is mild dilatation of the ascending aorta, measuring 41 mm.   6. The inferior vena cava is dilated in size with <50% respiratory  variability, suggesting right atrial pressure of 15 mmHg.    Coronary intervention 10/16/2023: 1.  Acute STEMI involving the left circumflex, previously stented vessel with severe stenosis at the distal stent edge before the bifurcation of 2 OM subbranches.  Treated successfully with a 2.5 x 12 mm Onyx DES 2.  Severe stenosis of the proximal LAD with near subtotal occlusion of the old stent greater than 95% stenosis 3.  Severe 90% stenosis of the RCA at the previous stent edge in the proximal vessel 4.  Mildly elevated LVEDP 21 mmHg     Recommendations: The patient is admitted to the CV ICU for further care.  He has been on clopidogrel  75 mg daily along with aspirin  and he is now loaded with ticagrelor.  With his severe ischemic cardiomyopathy and multiple MIs, likely will proceed with staged PCI of the LAD and RCA vessels prior to discharge.  Plan discussed with patient and family. Check 2D echo to reassess LV function. Lactate 1.2 at completion of procedure, pt hemodynamically stable.     Physical Exam .   Physical Exam Vitals and nursing note reviewed.  Constitutional:      General: He is not in acute distress. Neck:     Vascular: No JVD.  Cardiovascular:     Rate and Rhythm: Normal rate and regular rhythm.     Heart sounds: Normal heart sounds. No murmur heard.  Pulmonary:     Effort: Pulmonary effort is normal.     Breath sounds: Normal breath sounds. No wheezing or rales.      Assessment & Plan .     53 y/o male w/hypertension, hyperlipidemia, CAD w/multiple prior pCI's,  ischemic cardiomyopathy s/p ICD, h/o VT, nicotine dependence, admitted with STEMI  STEMI: Culprit PCI to Lcx (Synergy 2.5 X 12 mm DES) at the distal edge of prior mid Lcx stent Residual severe non culprit stenoses in prox LAD and prox RCA Plan for staged PCI today. Continue DAPT with Aspirin  and Brilinta. Continue Lipitor  80 gm daily. (LDL 115 at baseline, but was not compliant). Continue nicotine patch.  See below re: ischemic cardiomyopathy.   Ischemic  cardiomyopathy: EF 30-35%, chronically reduced. S/p ICD. No mention of thrombus. Rate in 50s and 60s. Was supposed to be on Coreg  12.5 mg bid,  lisinopril  10, Jardiance  25 at home, but was not compliant)). Await staged PCI before starting GDMT. (Was on lisinopril  10, Jardiance  25 at home, but was not compliant)). Will then start Entresto  24-26 mg bid, spironolactone  12.5 mg daily, maybe metoprolol  succinate 25 mg daily. Resume Jardiance  at 10 mg daily.)  Type 2 DM: Uncontrolled, A1C 10%. Was not taking insulin for a month. Insulin as per diabetes coordinator.  Nicotine dependence; Nicotine patch For questions or updates, please contact [facility] Please consult [email or web address] for contact info under        Signed, Jonathan JINNY Lawrence, MD

## 2023-10-17 NOTE — Telephone Encounter (Signed)
 Patient Product/process development scientist completed.    The patient is insured through [employer]. Patient has Medicare and is not eligible for a copay card, but may be able to apply for patient assistance or Medicare RX Payment Plan (Patient Must reach out to their plan, if eligible for payment plan), if available.    Ran test claim for sacubitril -valsartan  24-26 MG and the current 30 day co-pay is $75.30.  Ran test claim for Jardiance  10 MG and the current 30 day co-pay is $297.00 due to a $250.00 deductible.  Will be $47.00 once deductible is met.  This test claim was processed through [facility]- copay amounts may vary at other pharmacies due to pharmacy/plan contracts, or as the patient moves through the different stages of their insurance plan.     Reyes Sharps, CPHT Pharmacy Technician Patient Advocate Specialist Lead [facility] Patient Advocate Team Direct Number: [phone]  Fax: [phone]

## 2023-10-17 NOTE — Telephone Encounter (Signed)
 Pharmacy Patient Advocate Encounter   Received notification from Inpatient Request that prior authorization for Freestyle Libre 3 Plus Sensors is required/requested.   Insurance verification completed.   The patient is insured through [employer].   Per test claim: PA required; PA submitted to above mentioned insurance via Latent Key/confirmation #/EOC [id number] Status is pending

## 2023-10-17 NOTE — H&P (View-Only) (Signed)
 Patient Name: Jonathan Fischer Date of Encounter: 10/17/2023 [facility] Cardiologist: Peter Swaziland, MD   Interval Summary  .    No chest pain this morning Occasional PAC, PVC this morning  Vital Signs .    Vitals:   10/17/23 0500 10/17/23 0600 10/17/23 0639 10/17/23 0808  BP: 130/70 122/67    Pulse: (!) 55 62    Resp:      Temp:   98.5 F (36.9 C) 98.3 F (36.8 C)  TempSrc:   Oral Oral  SpO2: 95% 95%    Weight:      Height:        Intake/Output Summary (Last 24 hours) at 10/17/2023 0827 Last data filed at 10/16/2023 1300 Gross per 24 hour  Intake 284.67 ml  Output --  Net 284.67 ml      10/16/2023    2:30 AM 10/16/2023   12:58 AM 07/21/2023    2:23 PM  Last 3 Weights  Weight (lbs) 211 lb 13.8 oz 200 lb 209 lb 12.8 oz  Weight (kg) 96.1 kg 90.719 kg 95.165 kg      Telemetry/ECG    10/17/2023 - Personally Reviewed Occasional PVC's  EKG 10/17/2023: Sinus rhythm PAC's with aberrant conduction Anterolateral infarct, age indeterminate  Echocardiogram 10/16/2023:  1. Left ventricular ejection fraction, by estimation, is 30 to 35%. The  left ventricle has moderate to severely decreased function. The left  ventricle demonstrates regional wall motion abnormalities (see scoring  diagram/findings for description). The  left ventricular internal cavity size was severely dilated. Left  ventricular diastolic parameters are consistent with Grade Fischer diastolic  dysfunction (pseudonormalization).   2. Right ventricular systolic function is normal. The right ventricular  size is moderately enlarged.   3. The mitral valve is normal in structure. Mild mitral valve  regurgitation.   4. The aortic valve is normal in structure. Aortic valve regurgitation is  mild.   5. There is mild dilatation of the ascending aorta, measuring 41 mm.   6. The inferior vena cava is dilated in size with <50% respiratory  variability, suggesting right atrial pressure of 15 mmHg.    Coronary intervention 10/16/2023: 1.  Acute STEMI involving the left circumflex, previously stented vessel with severe stenosis at the distal stent edge before the bifurcation of 2 OM subbranches.  Treated successfully with a 2.5 x 12 mm Onyx DES 2.  Severe stenosis of the proximal LAD with near subtotal occlusion of the old stent greater than 95% stenosis 3.  Severe 90% stenosis of the RCA at the previous stent edge in the proximal vessel 4.  Mildly elevated LVEDP 21 mmHg     Recommendations: The patient is admitted to the CV ICU for further care.  He has been on clopidogrel  75 mg daily along with aspirin  and he is now loaded with ticagrelor.  With his severe ischemic cardiomyopathy and multiple MIs, likely will proceed with staged PCI of the LAD and RCA vessels prior to discharge.  Plan discussed with patient and family. Check 2D echo to reassess LV function. Lactate 1.2 at completion of procedure, pt hemodynamically stable.     Physical Exam .   Physical Exam Vitals and nursing note reviewed.  Constitutional:      General: He is not in acute distress. Neck:     Vascular: No JVD.  Cardiovascular:     Rate and Rhythm: Normal rate and regular rhythm.     Heart sounds: Normal heart sounds. No murmur heard.  Pulmonary:     Effort: Pulmonary effort is normal.     Breath sounds: Normal breath sounds. No wheezing or rales.      Assessment & Plan .     53 y/o male w/hypertension, hyperlipidemia, CAD w/multiple prior pCI's,  ischemic cardiomyopathy s/p ICD, h/o VT, nicotine dependence, admitted with STEMI  STEMI: Culprit PCI to Lcx (Synergy 2.5 X 12 mm DES) at the distal edge of prior mid Lcx stent Residual severe non culprit stenoses in prox LAD and prox RCA Plan for staged PCI today. Continue DAPT with Aspirin  and Brilinta. Continue Lipitor  80 gm daily. (LDL 115 at baseline, but was not compliant). Continue nicotine patch.  See below re: ischemic cardiomyopathy.   Ischemic  cardiomyopathy: EF 30-35%, chronically reduced. S/p ICD. No mention of thrombus. Rate in 50s and 60s. Was supposed to be on Coreg  12.5 mg bid,  lisinopril  10, Jardiance  25 at home, but was not compliant)). Await staged PCI before starting GDMT. (Was on lisinopril  10, Jardiance  25 at home, but was not compliant)). Will then start Entresto  24-26 mg bid, spironolactone  12.5 mg daily, maybe metoprolol  succinate 25 mg daily. Resume Jardiance  at 10 mg daily.)  Type 2 DM: Uncontrolled, A1C 10%. Was not taking insulin for a month. Insulin as per diabetes coordinator.  Nicotine dependence; Nicotine patch For questions or updates, please contact [facility] Please consult [email or web address] for contact info under        Signed, Newman JINNY Lawrence, MD

## 2023-10-17 NOTE — Discharge Instructions (Signed)

## 2023-10-17 NOTE — Inpatient Diabetes Management (Addendum)
 Inpatient Diabetes Program Recommendations  AACE/ADA: New Consensus Statement on Inpatient Glycemic Control (2015)  Target Ranges:  Prepandial:   less than 140 mg/dL      Peak postprandial:   less than 180 mg/dL (1-2 hours)      Critically ill patients:  140 - 180 mg/dL    Latest Reference Range & Units 10/16/23 01:00  Hemoglobin A1C 4.8 - 5.6 % 10.0 (H)  240 mg/dl  (H): Data is abnormally high  Latest Reference Range & Units 10/16/23 02:27 10/16/23 06:36 10/16/23 11:19 10/16/23 16:29 10/16/23 20:46  Glucose-Capillary 70 - 99 mg/dL 679 (H)  5 units Novolog  332 (H)  15 units Novolog   241 (H)  9 units Novolog  276 (H)  12 units Novolog  297 (H)  (H): Data is abnormally high  Latest Reference Range & Units 10/17/23 06:39  Glucose-Capillary 70 - 99 mg/dL 726 (H)  (H): Data is abnormally high   Admit with: CP/ STEMI  History: DM2  Home DM Meds: Tresiba 50 units daily (NOT taking for 3 weeks)       Jardiance  25 mg daily (NOT taking)         Current Orders: Novolog Moderate Correction Scale/ SSI (0-15 units) TID AC     Novolog 4 units TID with meals    MD- Note CBG 273 this AM Per record review, pt was taking Tresiba insulin at home but ran out 3 weeks ago  Please consider starting weight based dose Lantus: Lantus 20 units daily (0.2 units/kg) Please start this AM   PCP?? Lonell Collet with [facility]??   Addendum 12pm--Met w/ pt at bedside.  Wife was also at bedside.  Pt and wife told me pt ran out of his meds about 1 month ago.  Could not get refills b/c he did not have an appt with PCP.  Has been going to [facility] saw Dr Luke but according to pt, Dr. Luke left practice.  Pt told me he wants to start going to [facility] as it is closer to his house.  Does not have CBG meter at home--Used to use the Freestyle Libre 2 CGM but ran out of sensors as well.    Spoke with patient about his current A1c of 10%--pt  and wife could not recall what his last A1c was but thought it was in the 9-11% range.  Explained what an A1c is and what it measures.  Reminded patient that his goal A1c is 7% or less per ADA standards to prevent both acute and long-term complications.  Explained to patient the extreme importance of good glucose control at home especially since pt has known cardiac disease.  Encouraged patient to check his CBGs at least TID AC at home and to record all CBGs in a logbook for his PCP to review.  Also discussed DM diet information with patient.  Encouraged patient to avoid beverages with sugar (regular soda, sweet tea, lemonade, fruit juice) and to consume mostly water.  Discussed what foods contain carbohydrates and how carbohydrates affect the body's blood sugar levels.  Encouraged patient to be careful with his portion sizes (especially grains, starchy vegetables, and fruits).  Wife told me she cooks in the home and they try to eat lower carb foods.  Pt admits to drinking sweet tea and will work on eliminating sweet tea from diet.  Pt and wife interested in pt restarting CGM at home.  Per OP pharmacy, FSL3 plus covered  but needs PA--PA started by OP pharmacy.  OP pharmacy also checked cost of Tresiba insulin--Tresiba insulin will be $35 max cost.  Pt will need Rxs for Jardiance , Tresiba insulin, Freestyle Libre 3 glucose sensors.  Gave pt's wife 2 samples of the FSL3 plus and had her download to appt to pt's cell phone.  Pt and wife aware of how to start sensor when they go home.       --Will follow patient during hospitalization--  Adina Rudolpho Arrow RN, MSN, CDCES Diabetes Coordinator Inpatient Glycemic Control Team Team Pager: [phone] (8a-5p)

## 2023-10-18 ENCOUNTER — Other Ambulatory Visit (HOSPITAL_COMMUNITY): Payer: Self-pay

## 2023-10-18 ENCOUNTER — Encounter (HOSPITAL_COMMUNITY): Payer: Self-pay | Admitting: Cardiovascular Disease

## 2023-10-18 ENCOUNTER — Telehealth (HOSPITAL_COMMUNITY): Payer: Self-pay | Admitting: Pharmacy Technician

## 2023-10-18 LAB — BASIC METABOLIC PANEL WITH GFR
Anion gap: 13 (ref 5–15)
BUN: 13 mg/dL (ref 6–20)
CO2: 21 mmol/L — ABNORMAL LOW (ref 22–32)
Calcium: 9 mg/dL (ref 8.9–10.3)
Chloride: 101 mmol/L (ref 98–111)
Creatinine, Ser: 0.69 mg/dL (ref 0.61–1.24)
GFR, Estimated: >60 mL/min (ref >60)
Glucose, Bld: 173 mg/dL — ABNORMAL HIGH (ref 70–99)
Potassium: 3.4 mmol/L — ABNORMAL LOW (ref 3.5–5.1)
Sodium: 135 mmol/L (ref 135–145)

## 2023-10-18 LAB — CBC
HCT: 49.7 % (ref 39.0–52.0)
Hemoglobin: 17.8 g/dL — ABNORMAL HIGH (ref 13.0–17.0)
MCH: 31.4 pg (ref 26.0–34.0)
MCHC: 35.8 g/dL (ref 30.0–36.0)
MCV: 87.7 fL (ref 80.0–100.0)
Platelets: 238 K/uL (ref 150–400)
RBC: 5.67 MIL/uL (ref 4.22–5.81)
RDW: 12.2 % (ref 11.5–15.5)
WBC: 11.2 K/uL — ABNORMAL HIGH (ref 4.0–10.5)
nRBC: 0 % (ref 0.0–0.2)

## 2023-10-18 LAB — GLUCOSE, CAPILLARY: Glucose-Capillary: 209 mg/dL — ABNORMAL HIGH (ref 70–99)

## 2023-10-18 LAB — MAGNESIUM: Magnesium: 2.1 mg/dL (ref 1.7–2.4)

## 2023-10-18 MED ORDER — TRESIBA FLEXTOUCH 100 UNIT/ML ~~LOC~~ SOPN: 30.0000 [IU] | PEN_INJECTOR | Freq: Every day | SUBCUTANEOUS | Status: DC

## 2023-10-18 MED ORDER — SPIRONOLACTONE 25 MG PO TABS
12.5000 mg | ORAL_TABLET | Freq: Every day | ORAL | 3 refills | Status: DC
  Filled 2023-10-18: qty 45, 90d supply, fill #0

## 2023-10-18 MED ORDER — TICAGRELOR 90 MG PO TABS
90.0000 mg | ORAL_TABLET | Freq: Two times a day (BID) | ORAL | 3 refills | Status: DC
  Filled 2023-10-18: qty 60, 30d supply, fill #0

## 2023-10-18 MED ORDER — TRESIBA FLEXTOUCH 100 UNIT/ML ~~LOC~~ SOPN: 30.0000 [IU] | PEN_INJECTOR | Freq: Every day | SUBCUTANEOUS | 1 refills | Status: DC

## 2023-10-18 MED ORDER — METFORMIN HCL 500 MG PO TABS
500.0000 mg | ORAL_TABLET | Freq: Two times a day (BID) | ORAL | 3 refills | Status: DC
  Filled 2023-10-18: qty 180, 90d supply, fill #0

## 2023-10-18 MED ORDER — EMPAGLIFLOZIN 10 MG PO TABS
10.0000 mg | ORAL_TABLET | Freq: Every day | ORAL | 3 refills | Status: AC
  Filled 2023-10-18: qty 30, 30d supply, fill #0

## 2023-10-18 MED ORDER — METOPROLOL SUCCINATE ER 25 MG PO TB24
25.0000 mg | ORAL_TABLET | Freq: Every day | ORAL | 3 refills | Status: DC
  Filled 2023-10-18: qty 90, 90d supply, fill #0

## 2023-10-18 MED ORDER — SACUBITRIL-VALSARTAN 24-26 MG PO TABS
1.0000 | ORAL_TABLET | Freq: Two times a day (BID) | ORAL | 3 refills | Status: DC
  Filled 2023-10-18: qty 180, 90d supply, fill #0

## 2023-10-18 MED ORDER — METOPROLOL SUCCINATE ER 25 MG PO TB24
25.0000 mg | ORAL_TABLET | Freq: Every day | ORAL | Status: DC
  Administered 2023-10-18: 25 mg via ORAL
  Filled 2023-10-18: qty 1

## 2023-10-18 MED ORDER — NICOTINE 14 MG/24HR TD PT24
14.0000 mg | MEDICATED_PATCH | Freq: Every day | TRANSDERMAL | 0 refills | Status: AC
  Filled 2023-10-18: qty 28, 28d supply, fill #0

## 2023-10-18 MED ORDER — TRESIBA FLEXTOUCH 100 UNIT/ML ~~LOC~~ SOPN
30.0000 [IU] | PEN_INJECTOR | Freq: Every day | SUBCUTANEOUS | 1 refills | Status: AC
  Filled 2023-10-18: qty 9, 30d supply, fill #0

## 2023-10-18 MED ORDER — ATORVASTATIN CALCIUM 80 MG PO TABS
80.0000 mg | ORAL_TABLET | Freq: Every day | ORAL | 3 refills | Status: DC
  Filled 2023-10-18: qty 90, 90d supply, fill #0

## 2023-10-18 MED FILL — Lidocaine HCl Local Preservative Free (PF) Inj 1%: INTRAMUSCULAR | Qty: 30 | Status: AC

## 2023-10-18 MED FILL — Verapamil HCl IV Soln 2.5 MG/ML: INTRAVENOUS | Qty: 2 | Status: AC

## 2023-10-18 NOTE — TOC CM/SW Note (Signed)
 Transition of Care [facility]) - Inpatient Brief Assessment   Patient Details  Name: Jonathan Fischer MRN: [id number] Date of Birth: 10/10/1970  [facility] of [facility]) CM/SW Contact:    Sudie Erminio Deems, RN Phone Number: 10/18/2023, 11:09 AM   Clinical Narrative: Patient presented for chest pain. Patient has PCP and insurance. Benefits check has been completed for medications. No home needs identified.   Transition of Care Asessment: Insurance and Status: Insurance coverage has been reviewed Patient has primary care physician: Yes Prior/Current Home Services: No current home services Social Drivers of Health Review: SDOH reviewed no interventions necessary Readmission risk has been reviewed: Yes Transition of care needs: no transition of care needs at this time

## 2023-10-18 NOTE — Progress Notes (Signed)
 Pt. Notified to start metoprolol  tomorrow.

## 2023-10-18 NOTE — Progress Notes (Signed)
 DISCHARGE NOTE HOME [place] [facility] to be discharged Home per MD order. Discussed prescriptions and follow up appointments with the patient. Prescriptions given to patient; medication list explained in detail. Patient verbalized understanding.  Skin clean, dry and intact without evidence of skin break down, no evidence of skin tears noted. IV catheter discontinued intact. Site without signs and symptoms of complications. Dressing and pressure applied. Pt denies pain at the site currently. No complaints noted.  Patient free of lines, drains, and wounds.   An After Visit Summary (AVS) was printed and given to the patient. Patient escorted via wheelchair, and discharged home via private auto.  Peyton SHAUNNA Pepper, RN

## 2023-10-18 NOTE — Progress Notes (Signed)
 CARDIAC REHAB PHASE I    PRE:                Rate/Rhythm: 75 NSR   BP:                  Sitting: 131/87                                                  SpO2: 98   MODE:            Ambulation: 240 ft       POST:             Rate/Rhythm: 77 NSR   BP:                  Sitting: 135/89                                      SpO2:98   Pt amb independently, with standby assistance. Denies CP and SOB during amb and was returned to room w/o complaint. No questions or concerns noted at this time.    Isaiah JAYSON Liverpool, RN   10/18/2023  10:30 AM

## 2023-10-18 NOTE — Discharge Summary (Signed)
 Discharge Summary   Patient ID: Jonathan Fischer MRN: [id number]; DOB: 04/15/1970  Admit date: 10/16/2023 Discharge date: 10/18/2023  PCP:  Gerome Brunet, DO   [facility] Providers Cardiologist:  Peter Swaziland, MD  Electrophysiologist:  Fonda Kitty, MD      Discharge Diagnoses  Principal Problem:   STEMI (ST elevation myocardial infarction) [facility]) Active Problems:   TOBACCO USER   Implantable cardioverter-defibrillator (ICD) in situ   Coronary artery disease involving native coronary artery of native heart with unstable angina pectoris (HCC)   Chronic systolic congestive heart failure (HCC)   Hyperlipidemia   Cardiomyopathy, ischemic   Angina pectoris   Diagnostic Studies/Procedures   Coronary stent intervention 10/17/23: 1.  Successful PCI of severe stenosis in the proximal RCA using a 3.5 x 16 mm Synergy DES 2.  Resolution of the thrombotic lesion within the proximal LAD stent, now with mild diffuse restenosis, TIMI-3 flow, and no evidence of filling defect on angiography 3.  Atrial fibrillation with RVR occurs at the completion of the procedure, patient treated with amiodarone boluses and IV metoprolol  bolus.  Will start on IV amiodarone upon return to the CV-ICU.  Will start IV heparin  once the TR band is off.  Further plans for anticoagulation per the primary service.  May need to alter antiplatelet therapy if decision made to put him on long-term oral anticoagulation.  Diagnostic Dominance: Right  Intervention    LHC 10/16/23: 1.  Acute STEMI involving the left circumflex, previously stented vessel with severe stenosis at the distal stent edge before the bifurcation of 2 OM subbranches.  Treated successfully with a 2.5 x 12 mm Onyx DES 2.  Severe stenosis of the proximal LAD with near subtotal occlusion of the old stent greater than 95% stenosis 3.  Severe 90% stenosis of the RCA at the previous stent edge in the proximal vessel 4.  Mildly elevated LVEDP  21 mmHg   Recommendations: The patient is admitted to the CV ICU for further care.  He has been on clopidogrel  75 mg daily along with aspirin  and he is now loaded with ticagrelor.  With his severe ischemic cardiomyopathy and multiple MIs, likely will proceed with staged PCI of the LAD and RCA vessels prior to discharge.  Plan discussed with patient and family. Check 2D echo to reassess LV function. Lactate 1.2 at completion of procedure, pt hemodynamically stable.   Diagnostic Dominance: Right  Intervention   Echo 10/16/23:  1. Left ventricular ejection fraction, by estimation, is 30 to 35%. The  left ventricle has moderate to severely decreased function. The left  ventricle demonstrates regional wall motion abnormalities (see scoring  diagram/findings for description). The  left ventricular internal cavity size was severely dilated. Left  ventricular diastolic parameters are consistent with Grade Fischer diastolic  dysfunction (pseudonormalization).   2. Right ventricular systolic function is normal. The right ventricular  size is moderately enlarged.   3. The mitral valve is normal in structure. Mild mitral valve  regurgitation.   4. The aortic valve is normal in structure. Aortic valve regurgitation is  mild.   5. There is mild dilatation of the ascending aorta, measuring 41 mm.   6. The inferior vena cava is dilated in size with <50% respiratory  variability, suggesting right atrial pressure of 15 mmHg.      _____________   History of Present Illness   Jonathan Fischer is a 53 y.o. male with with CAD status post PCI to the RCA, LAD,  left circumflex with most recent PCI in 2013, ischemic cardiomyopathy with EF 30%, hypertension, hyperlipidemia, ventricular tachycardia status post ICD who is being seen 10/16/2023 for the evaluation of chest pain.   Mr. Leveque called EMS after acute onset substernal chest pressure that occurred after having sex with his wife.  He attempted to  improve his chest discomfort which was rated at a 10 out of 10 with multiple doses of sublingual nitroglycerin  which did not improve his symptoms.  Upon arrival by EMS his ECG showed inferior ST elevations.  He was treated by EMS with 4 mg of IV morphine .  He attempted to take 325 mg of aspirin  but unfortunately vomited this up.  Subsequent ECG showed increased ST elevations in the inferior and anterolateral leads.   He has a long history of CAD with multiple stents in each of his 3 coronary arteries.  His last heart cath was in 2020 which showed distal LAD lesion of 85%, patent stents in the proximal and mid RCA, proximal to mid circumflex, proximal to mid LAD.   He reports daily adherence to his aspirin  and clopidogrel  medications.  Hospital Course   Consultants: none  Acute inferior STEMI CAD with multiple prior PCI/DES He was taken emergently to the cath lab for revascularization. LHC showed severe stenosis at the distal stent edge before bifurcation of 2 OM sub-branches successfully treated with 2.5 x 12 mm DES. There was also severe 90% stenosis of the RCA at previous stent edge in the proximal vessel. LVEDP 21 mmHg. Planned for staged PCI of the LAD and RCA vessels.    He returned to cath lab 10/17/23 and underwent successful PCI/DES 3.5 x 16 mm to RCA. Resolution of thrombotic occlusion in the proximal LAD was noted.   Plavix  was transitioned to brilinta. He was discharged on DAPT, 80 mg lipitor .    Afib with RVR Second heart cath complicated by Afib with RVR treated with IV amiodarone boluses and IV lopressor . He converted and remains in SR. No OAC planned.    Chronic systolic heart failure ICM Echo this admission with newly reduced LVEF 30-35%, WMA noted, grade 2 DD, normal RV, mild MR, mild AI.  GDMT titrated to include: 25 mg toprol  daily, 24-26 mg entresto  BID, 12.5 mg spironolactone  daily Need BMP in 1 week   Hypertension Managed in the context of CHF.    Hyperlipidemia  with LDL goal < 55 10/16/2023: Cholesterol 179; HDL 26; LDL Cholesterol 115; Triglycerides 191; VLDL 38 LPA 9.5   DM2 Adjusted insulin regimen Started 500 mg metformin BID 10 mg jardiance  A1c 10%   Nicotine dependence At patient request, discharged on nicotine patch   Pt seen and examined by Dr. Elmira and felt stable for discharge. Follow up has been arranged.     Did the patient have an acute coronary syndrome (MI, NSTEMI, STEMI, etc) this admission?:  Yes                               AHA/ACC ACS Clinical Performance & Quality Measures: Aspirin  prescribed? - Yes ADP Receptor Inhibitor (Plavix /Clopidogrel , Brilinta/Ticagrelor or Effient /Prasugrel ) prescribed (includes medically managed patients)? - Yes Beta Blocker prescribed? - Yes High Intensity Statin (Lipitor  40-80mg  or Crestor 20-40mg ) prescribed? - Yes EF assessed during THIS hospitalization? - Yes For EF <40%, was ACEI/ARB prescribed? - Yes For EF <40%, Aldosterone Antagonist (Spironolactone  or Eplerenone) prescribed? - Yes Cardiac Rehab Phase Fischer ordered (including medically managed  patients)? - Yes       The patient will be scheduled for a TOC follow up appointment in 7 days.  A message has been sent to the [facility] and Scheduling Pool at the office where the patient should be seen for follow up.  _____________  Discharge Vitals Blood pressure 135/89, pulse 62, temperature 98 F (36.7 C), temperature source Oral, resp. rate 16, height 6' 1 (1.854 m), weight 96.1 kg, SpO2 96%.  Filed Weights   10/16/23 0230 10/17/23 1156 10/17/23 2127  Weight: 96.1 kg 95.3 kg 96.1 kg    Labs & Radiologic Studies  CBC Recent Labs    10/16/23 0100 10/16/23 0358 10/17/23 1211 10/18/23 0509  WBC 17.6*   < > 12.4* 11.2*  NEUTROABS 6.1  --   --   --   HGB 16.4   < > 17.9* 17.8*  HCT 46.7   < > 50.0 49.7  MCV 90.0   < > 88.2 87.7  PLT 290   < > 219 238   < > = values in this interval not displayed.   Basic Metabolic  Panel Recent Labs    10/17/23 1211 10/18/23 0509  NA 136 135  K 3.8 3.4*  CL 102 101  CO2 23 21*  GLUCOSE 136* 173*  BUN 18 13  CREATININE 0.66 0.69  CALCIUM  9.2 9.0  MG  --  2.1   Liver Function Tests Recent Labs    10/16/23 0100  AST 16  ALT 18  ALKPHOS 61  BILITOT 0.8  PROT 7.2  ALBUMIN 3.8   No results for input(s): LIPASE, AMYLASE in the last 72 hours. High Sensitivity Troponin:   Recent Labs  Lab 10/16/23 0100 10/16/23 0358  TROPONINIHS 7 8,323*    No results for input(s): TRNPT in the last 720 hours.  BNP Invalid input(s): POCBNP No results for input(s): PROBNP in the last 72 hours.  No results for input(s): BNP in the last 72 hours.  D-Dimer No results for input(s): DDIMER in the last 72 hours. Hemoglobin A1C Recent Labs    10/16/23 0100  HGBA1C 10.0*   Fasting Lipid Panel Recent Labs    10/16/23 0100  CHOL 179  HDL 26*  LDLCALC 115*  TRIG 191*  CHOLHDL 6.9   Lipoprotein (a)  Date/Time Value Ref Range Status  10/16/2023 03:58 AM 9.5 <75.0 nmol/L Final    Comment:    (NOTE) This test was developed and its performance characteristics determined by [facility]. It has not been cleared or approved by the Food and Drug Administration. Note:  Values greater than or equal to 75.0 nmol/L may       indicate an independent risk factor for CHD,       but must be evaluated with caution when applied       to non-Caucasian populations due to the       influence of genetic factors on Lp(a) across       ethnicities. Performed At: [facility] [address] [place], [place] [id number] Jennette Shorter MD Ey:[phone]     Thyroid  Function Tests No results for input(s): TSH, T4TOTAL, T3FREE, THYROIDAB in the last 72 hours.  Invalid input(s): FREET3 _____________  CARDIAC CATHETERIZATION Result Date: 10/17/2023 1.  Successful PCI of severe stenosis in the proximal RCA using a 3.5 x 16 mm Synergy DES 2.  Resolution of  the thrombotic lesion within the proximal LAD stent, now with mild diffuse restenosis, TIMI-3 flow, and no evidence of  filling defect on angiography 3.  Atrial fibrillation with RVR occurs at the completion of the procedure, patient treated with amiodarone boluses and IV metoprolol  bolus.  Will start on IV amiodarone upon return to the CV-ICU.  Will start IV heparin  once the TR band is off.  Further plans for anticoagulation per the primary service.  May need to alter antiplatelet therapy if decision made to put him on long-term oral anticoagulation.   ECHOCARDIOGRAM COMPLETE Result Date: 10/16/2023    ECHOCARDIOGRAM REPORT   Patient Name:   Jonathan Fischer Date of Exam: 10/16/2023 Medical Rec #:  [id number]           Height:       73.0 in Accession #:    [id number]          Weight:       211.9 lb Date of Birth:  [birth year]          BSA:          2.205 m Patient Age:    52 years            BP:           117/75 mmHg Patient Gender: M                   HR:           61 bpm. Exam Location:  Inpatient Procedure: 2D Echo, Cardiac Doppler, Color Doppler and Intracardiac            Opacification Agent (Both Spectral and Color Flow Doppler were            utilized during procedure). Indications:    Chest Pain R07.9  History:        Patient has no prior history of Echocardiogram examinations.                 CHF, Acute MI and CAD, Signs/Symptoms:Chest Pain; Risk                 Factors:Current Smoker and Hypertension. STEMI, Ischemic                 cardiomyopathy, Hyperlipidemia, Ventricular tachycardia.  Sonographer:    BERNARDA ROCKS Referring Phys: [id number] MICHAEL COOPER IMPRESSIONS  1. Left ventricular ejection fraction, by estimation, is 30 to 35%. The left ventricle has moderate to severely decreased function. The left ventricle demonstrates regional wall motion abnormalities (see scoring diagram/findings for description). The left ventricular internal cavity size was severely dilated. Left ventricular diastolic  parameters are consistent with Grade Fischer diastolic dysfunction (pseudonormalization).  2. Right ventricular systolic function is normal. The right ventricular size is moderately enlarged.  3. The mitral valve is normal in structure. Mild mitral valve regurgitation.  4. The aortic valve is normal in structure. Aortic valve regurgitation is mild.  5. There is mild dilatation of the ascending aorta, measuring 41 mm.  6. The inferior vena cava is dilated in size with <50% respiratory variability, suggesting right atrial pressure of 15 mmHg. FINDINGS  Left Ventricle: Left ventricular ejection fraction, by estimation, is 30 to 35%. The left ventricle has moderate to severely decreased function. The left ventricle demonstrates regional wall motion abnormalities. Definity  contrast agent was given IV to delineate the left ventricular endocardial borders. The left ventricular internal cavity size was severely dilated. There is no left ventricular hypertrophy. Left ventricular diastolic parameters are consistent with Grade Fischer diastolic dysfunction (pseudonormalization).  LV Wall Scoring: The apical lateral segment,  apical septal segment, and apex are akinetic. The mid and distal anterior wall, antero-lateral wall, mid and distal inferior wall, posterior wall, mid anteroseptal segment, mid inferoseptal segment, and basal inferoseptal segment are hypokinetic. The basal anteroseptal segment, basal anterior segment, and basal inferior segment are normal. Right Ventricle: The right ventricular size is moderately enlarged. No increase in right ventricular wall thickness. Right ventricular systolic function is normal. Left Atrium: Left atrial size was normal in size. Right Atrium: Right atrial size was normal in size. Pericardium: There is no evidence of pericardial effusion. Mitral Valve: The mitral valve is normal in structure. Mild mitral valve regurgitation. MV peak gradient, 4.0 mmHg. The mean mitral valve gradient is 2.0 mmHg.  Tricuspid Valve: The tricuspid valve is normal in structure. Tricuspid valve regurgitation is mild. Aortic Valve: The aortic valve is normal in structure. Aortic valve regurgitation is mild. Aortic valve mean gradient measures 9.0 mmHg. Aortic valve peak gradient measures 15.8 mmHg. Aortic valve area, by VTI measures 2.35 cm. Pulmonic Valve: The pulmonic valve was normal in structure. Pulmonic valve regurgitation is not visualized. Aorta: The aortic root was not well visualized. There is mild dilatation of the ascending aorta, measuring 41 mm. Venous: The inferior vena cava is dilated in size with less than 50% respiratory variability, suggesting right atrial pressure of 15 mmHg. IAS/Shunts: No atrial level shunt detected by color flow Doppler.  LEFT VENTRICLE PLAX 2D LVIDd:         6.60 cm      Diastology LVIDs:         5.30 cm      LV e' medial:    7.94 cm/s LV PW:         0.90 cm      LV E/e' medial:  11.0 LV IVS:        0.90 cm      LV e' lateral:   8.27 cm/s LVOT diam:     2.50 cm      LV E/e' lateral: 10.6 LV SV:         92 LV SV Index:   42 LVOT Area:     4.91 cm  LV Volumes (MOD) LV vol d, MOD A2C: 316.0 ml LV vol d, MOD A4C: 308.0 ml LV vol s, MOD A2C: 186.0 ml LV vol s, MOD A4C: 204.0 ml LV SV MOD A2C:     130.0 ml LV SV MOD A4C:     308.0 ml LV SV MOD BP:      123.2 ml RIGHT VENTRICLE             IVC RV Basal diam:  4.60 cm     IVC diam: 2.50 cm RV S prime:     14.90 cm/s TAPSE (M-mode): 2.5 cm RVSP:           46.9 mmHg LEFT ATRIUM             Index        RIGHT ATRIUM           Index LA diam:        5.10 cm 2.31 cm/m   RA Pressure: 8.00 mmHg LA Vol (A2C):   75.5 ml 34.24 ml/m  RA Area:     19.40 cm LA Vol (A4C):   72.6 ml 32.93 ml/m  RA Volume:   52.40 ml  23.76 ml/m LA Biplane Vol: 73.8 ml 33.47 ml/m  AORTIC VALVE  PULMONIC VALVE AV Area (Vmax):    2.34 cm      PV Vmax:       0.80 m/s AV Area (Vmean):   1.94 cm      PV Peak grad:  2.5 mmHg AV Area (VTI):     2.35 cm AV  Vmax:           199.00 cm/s AV Vmean:          144.000 cm/s AV VTI:            0.392 m AV Peak Grad:      15.8 mmHg AV Mean Grad:      9.0 mmHg LVOT Vmax:         94.70 cm/s LVOT Vmean:        57.000 cm/s LVOT VTI:          0.188 m LVOT/AV VTI ratio: 0.48  AORTA Ao Root diam: 3.40 cm Ao Asc diam:  4.10 cm MITRAL VALVE               TRICUSPID VALVE MV Area (PHT): 3.03 cm    TR Peak grad:   38.9 mmHg MV Area VTI:   2.59 cm    TR Mean grad:   39.0 mmHg MV Peak grad:  4.0 mmHg    TR Vmax:        312.00 cm/s MV Mean grad:  2.0 mmHg    Estimated RAP:  8.00 mmHg MV Vmax:       1.00 m/s    RVSP:           46.9 mmHg MV Vmean:      62.2 cm/s MV Decel Time: 250 msec    SHUNTS MV E velocity: 87.50 cm/s  Systemic VTI:  0.19 m MV A velocity: 88.20 cm/s  Systemic Diam: 2.50 cm MV E/A ratio:  0.99 Franck Azobou Tonleu Electronically signed by Joelle Cedars Tonleu Signature Date/Time: 10/16/2023/12:51:07 PM    Final    DG Chest Port 1 View Result Date: 10/16/2023 CLINICAL DATA:  STEMI with history of ischemic cardiomyopathy. EXAM: PORTABLE CHEST 1 VIEW COMPARISON:  PA Lat chest 07/13/2016, CTA chest 05/01/2020. FINDINGS: Left chest single lead AICD and wire insertion are stable. There is mild cardiomegaly. Stents in the LAD and circumflex coronary arteries. There is mild central vascular prominence but no overt edema. Chronic mild elevation right hemidiaphragm with overlying atelectasis. The lungs are clear of infiltrates.  No pleural effusion is seen. The mediastinum is normally outlined.  Thoracic cage is intact. IMPRESSION: 1. Mild cardiomegaly and central vascular prominence without overt edema. 2. Chronic mild elevation right hemidiaphragm with overlying atelectasis. Electronically Signed   By: Francis Quam M.D.   On: 10/16/2023 05:53   CARDIAC CATHETERIZATION Result Date: 10/16/2023 1.  Acute STEMI involving the left circumflex, previously stented vessel with severe stenosis at the distal stent edge before the  bifurcation of 2 OM subbranches.  Treated successfully with a 2.5 x 12 mm Onyx DES 2.  Severe stenosis of the proximal LAD with near subtotal occlusion of the old stent greater than 95% stenosis 3.  Severe 90% stenosis of the RCA at the previous stent edge in the proximal vessel 4.  Mildly elevated LVEDP 21 mmHg Recommendations: The patient is admitted to the CV ICU for further care.  He has been on clopidogrel  75 mg daily along with aspirin  and he is now loaded with ticagrelor.  With his severe ischemic cardiomyopathy and multiple MIs, likely  will proceed with staged PCI of the LAD and RCA vessels prior to discharge.  Plan discussed with patient and family. Check 2D echo to reassess LV function. Lactate 1.2 at completion of procedure, pt hemodynamically stable.   CUP PACEART REMOTE DEVICE CHECK Result Date: 09/21/2023 ICD Scheduled remote reviewed. Normal device function.  Presenting rhythm: VS. 1 VHR episode on 08/12/23 at 22:50, 7 beats NSVT at 228 bpm. HF diagnostics currently abnormal. Next remote 91 days. [facility], [facility]   Disposition Pt is being discharged home today in good condition.  Follow-up Plans & Appointments  Discharge Instructions     Amb Referral to Cardiac Rehabilitation   Complete by: As directed    Diagnosis:  Coronary Stents STEMI     After initial evaluation and assessments completed: Virtual Based Care may be provided alone or in conjunction with Phase 2 Cardiac Rehab based on patient barriers.: Yes   Intensive Cardiac Rehabilitation (ICR) [facility] location only OR Traditional Cardiac Rehabilitation (TCR) *If criteria for ICR are not met will enroll in TCR [facility] only): Yes       Discharge Medications Allergies as of 10/18/2023   No Known Allergies      Medication List     STOP taking these medications    carvedilol  12.5 MG tablet Commonly known as: COREG    clopidogrel  75 MG tablet Commonly known as: PLAVIX    ibuprofen 200 MG tablet Commonly known as: ADVIL    lisinopril  10 MG tablet Commonly known as: ZESTRIL    oxymetazoline 0.05 % nasal spray Commonly known as: AFRIN       TAKE these medications    aspirin  EC 81 MG tablet Take 1 tablet (81 mg total) by mouth daily.   atorvastatin  80 MG tablet Commonly known as: LIPITOR  Take 1 tablet (80 mg total) by mouth daily. Start taking on: October 19, 2023 What changed:  how much to take how to take this when to take this additional instructions   empagliflozin  10 MG Tabs tablet Commonly known as: JARDIANCE  Take 1 tablet (10 mg total) by mouth daily. Start taking on: October 19, 2023 What changed:  medication strength how much to take   metFORMIN 500 MG tablet Commonly known as: GLUCOPHAGE Take 1 tablet (500 mg total) by mouth 2 (two) times daily with a meal. Start taking on: October 20, 2023   metoprolol  succinate 25 MG 24 hr tablet Commonly known as: TOPROL -XL Take 1 tablet (25 mg total) by mouth daily.   nicotine 14 mg/24hr patch Commonly known as: NICODERM CQ - dosed in mg/24 hours Place 1 patch (14 mg total) onto the skin daily. Start taking on: October 19, 2023   nitroGLYCERIN  0.4 MG SL tablet Commonly known as: NITROSTAT  Place 1 tablet (0.4 mg total) under the tongue every 5 (five) minutes as needed. For chest pain   pantoprazole  40 MG tablet Commonly known as: PROTONIX  TAKE 1 TABLET ONE TIME DAILY (NEED MD APPOINTMENT)   sacubitril -valsartan  24-26 MG Commonly known as: ENTRESTO  Take 1 tablet by mouth 2 (two) times daily.   spironolactone  25 MG tablet Commonly known as: ALDACTONE  Take 0.5 tablets (12.5 mg total) by mouth daily.   ticagrelor 90 MG Tabs tablet Commonly known as: BRILINTA Take 1 tablet (90 mg total) by mouth 2 (two) times daily.   Tresiba FlexTouch 100 UNIT/ML FlexTouch Pen Generic drug: insulin degludec Inject 30 Units into the skin daily. What changed: how much to take         Outstanding Labs/Studies  BMP  in 1 week  Duration of  Discharge Encounter: APP Time: 25 minutes   Signed, Jon Nat Hails, PA 10/18/2023, 10:53 AM

## 2023-10-18 NOTE — Progress Notes (Signed)
 Heart Failure Nurse Navigator Progress Note  PCP: Gerome Brunet, DO PCP-Cardiologist: [place] Admission Diagnosis: Code STEMI  Admitted from: Home via EMS   Presentation:   Jonathan Fischer presented with CODE STEMI with elevation to v4, v5, v6 and central crushing chest pain, 1 hour after having sex. 3x SL nit.ro from home prior to arrival with no improvement. 324mg  ASA that he may have vomited up. EMS administered 4mg  Morphine , Hx of 7 MI's and 4 stents. BP 121/81, HR 62, Troponin 8,323, Lactic 2.6, and creat 1.8. Cath lab with Successful PCI of severe stenosis in the proximal RCA using a 3.5 x 16 mm Synergy DES.Resolution of the thrombotic lesion within the proximal LAD stent, now with mild diffuse restenosis, Atrial fibrillation with RVR occurs at completion of the procedure, patient treated with amiodarone boluses and IV metoprolol  bolus. Patient reports daily adherence to his aspirin  and clopidogrel  medications.   Patient was educated on the sign and symptoms of heart failure, daily weights, when to call his doctor or go to the ED. Diet/ fluid restrictions, taking all medications as prescribed and attending all medical appointments. Patient verbalized his understanding of all education. A HF TOC appointment was scheduled for 11/01/2023 @ 2 pm.   ECHO/ LVEF: 30-35% newly reduced  Clinical Course:  Past Medical History:  Diagnosis Date   Abnormal echocardiogram    a. 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   AICD (automatic cardioverter/defibrillator) present 11/2009   Arthritis    AUTOMATIC IMPLANTABLE CARDIAC DEFIBRILLATOR SITU 03/03/2010   Qualifier: Diagnosis of  By: Fernande, MD, [facility], Elspeth Darner    CAD (coronary artery disease)    s/p prior LAD, LCx and RCA stenting remotely for an anterior MI, recurrent MI in 2010 with LAD stent occlusion s/p thrombectomy and PTCA, STEMI  12/2011 s/p DES-prox LAD   Cardiomyopathy, ischemic 12/14/2011   Chronic systolic CHF (congestive heart failure) (HCC)    Headache    migraines   HTN (hypertension)    Hyperlipidemia    ICD (implantable cardiac defibrillator) in place    Ischemic cardiomyopathy    EF 25-30% s/p single-chamber Med[employer]onic ICD    NSVT (nonsustained ventricular tachycardia) (HCC) 12/14/2011   Obesity    STEMI (ST elevation myocardial infarction) (HCC) 12/12/2011   Tobacco abuse    TOBACCO USER 10/29/2009   Qualifier: Diagnosis of  By: Fernande, MD, C[facility] Elspeth Darner    Tooth pain 12/14/2011   VT (ventricular tachycardia) (HCC) 10/18/2012     Social History   Socioeconomic History   Marital status: Married    Spouse name: Not on file   Number of children: Not on file   Years of education: Not on file   Highest education level: Not on file  Occupational History   Not on file  Tobacco Use   Smoking status: Every Day    Current packs/day: 1.00    Average packs/day: 1 pack/day for 24.0 years (24.0 ttl pk-yrs)    Types: E-cigarettes, Cigarettes   Smokeless tobacco: Never  Vaping Use   Vaping status: Never Used  Substance and Sexual Activity   Alcohol use: Yes    Comment: Occassionally   Drug use: No   Sexual activity: Yes  Other Topics Concern   Not on file  Social History Narrative   Not on file   Social Drivers of Health   Financial Resource Strain: Not on file  Food Insecurity: No Food Insecurity (10/16/2023)   Hunger Vital Sign    Worried About Running Out of Food in the Last Year: Never true    Ran Out of Food in the Last Year: Never true  Transportation Needs: No Transportation Needs (10/16/2023)   PRAPARE - Administrator, Civil Service (Medical): No    Lack of Transportation (Non-Medical): No  Physical Activity: Not on file  Stress: Not on file  Social Connections: Not on file   Education Assessment and Provision:  Detailed education and instructions provided on  heart failure disease management including the following:  Signs and symptoms of Heart Failure When to call the physician Importance of daily weights Low sodium diet Fluid restriction Medication management Anticipated future follow-up appointments  Patient education given on each of the above topics.  Patient acknowledges understanding via teach back method and acceptance of all instructions.  Education Materials:  Living Better With Heart Failure Booklet, HF zone tool, & Daily Weight Tracker Tool.  Patient has scale at home: Yes Patient has pill box at home: NA    High Risk Criteria for Readmission and/or Poor Patient Outcomes: Heart failure hospital admissions (last 6 months): 0  No Show rate: 28 %  Difficult social situation: No Demonstrates medication adherence: Yes Primary Language: English  Literacy level: Reading, writing, and comprehension   Barriers of Care:   Diet/ fluid restrictions Daily weights Med cost concerns.   Considerations/Referrals:   Referral made to Heart Failure Pharmacist Stewardship: NA Referral made to Heart Failure CSW/NCM TOC: NA Referral made to Heart & Vascular TOC clinic: Yes, 11/01/2023 @ 2 pm.   Items for Follow-up on DC/TOC: Continued HF education Diet/ fluid restrictions/ daily weights   Stephane Haddock, BSN, RN Heart Failure Teacher, adult education Only

## 2023-10-18 NOTE — Plan of Care (Signed)
  Problem: Education: Goal: Knowledge of General Education information will improve Description: Including pain rating scale, medication(s)/side effects and non-pharmacologic comfort measures Outcome: Progressing   Problem: Health Behavior/Discharge Planning: Goal: Ability to manage health-related needs will improve Outcome: Progressing   Problem: Clinical Measurements: Goal: Ability to maintain clinical measurements within normal limits will improve Outcome: Progressing Goal: Will remain free from infection Outcome: Progressing Goal: Diagnostic test results will improve Outcome: Progressing Goal: Respiratory complications will improve Outcome: Progressing Goal: Cardiovascular complication will be avoided Outcome: Progressing   Problem: Activity: Goal: Risk for activity intolerance will decrease Outcome: Progressing   Problem: Nutrition: Goal: Adequate nutrition will be maintained Outcome: Progressing   Problem: Coping: Goal: Level of anxiety will decrease Outcome: Progressing   Problem: Elimination: Goal: Will not experience complications related to bowel motility Outcome: Progressing Goal: Will not experience complications related to urinary retention Outcome: Progressing   Problem: Pain Managment: Goal: General experience of comfort will improve and/or be controlled Outcome: Progressing   Problem: Safety: Goal: Ability to remain free from injury will improve Outcome: Progressing   Problem: Skin Integrity: Goal: Risk for impaired skin integrity will decrease Outcome: Progressing   Problem: Education: Goal: Understanding of cardiac disease, CV risk reduction, and recovery process will improve Outcome: Progressing Goal: Individualized Educational Video(s) Outcome: Progressing   Problem: Activity: Goal: Ability to tolerate increased activity will improve Outcome: Progressing   Problem: Cardiac: Goal: Ability to achieve and maintain adequate cardiovascular  perfusion will improve Outcome: Progressing   Problem: Health Behavior/Discharge Planning: Goal: Ability to safely manage health-related needs after discharge will improve Outcome: Progressing   Problem: Education: Goal: Understanding of CV disease, CV risk reduction, and recovery process will improve Outcome: Progressing Goal: Individualized Educational Video(s) Outcome: Progressing   Problem: Activity: Goal: Ability to return to baseline activity level will improve Outcome: Progressing   Problem: Cardiovascular: Goal: Ability to achieve and maintain adequate cardiovascular perfusion will improve Outcome: Progressing Goal: Vascular access site(s) Level 0-1 will be maintained Outcome: Progressing   Problem: Health Behavior/Discharge Planning: Goal: Ability to safely manage health-related needs after discharge will improve Outcome: Progressing   Problem: Education: Goal: Ability to describe self-care measures that may prevent or decrease complications (Diabetes Survival Skills Education) will improve Outcome: Progressing Goal: Individualized Educational Video(s) Outcome: Progressing   Problem: Coping: Goal: Ability to adjust to condition or change in health will improve Outcome: Progressing   Problem: Fluid Volume: Goal: Ability to maintain a balanced intake and output will improve Outcome: Progressing   Problem: Health Behavior/Discharge Planning: Goal: Ability to identify and utilize available resources and services will improve Outcome: Progressing Goal: Ability to manage health-related needs will improve Outcome: Progressing   Problem: Metabolic: Goal: Ability to maintain appropriate glucose levels will improve Outcome: Progressing   Problem: Nutritional: Goal: Maintenance of adequate nutrition will improve Outcome: Progressing Goal: Progress toward achieving an optimal weight will improve Outcome: Progressing   Problem: Skin Integrity: Goal: Risk for  impaired skin integrity will decrease Outcome: Progressing   Problem: Tissue Perfusion: Goal: Adequacy of tissue perfusion will improve Outcome: Progressing

## 2023-10-18 NOTE — Telephone Encounter (Signed)
 Patient Product/process development scientist completed.    The patient is insured through [employer]. Patient has Medicare and is not eligible for a copay card, but may be able to apply for patient assistance or Medicare RX Payment Plan (Patient Must reach out to their plan, if eligible for payment plan), if available.    Ran test claim for sacubitril -valsartan  (Entresto ) 24-26 mg and the current 30 day co-pay is $75.30 due to a deductible   This test claim was processed through [employer]- copay amounts may vary at other pharmacies due to [employer], or as the patient moves through the different stages of their insurance plan.     Reyes Sharps, CPHT Pharmacy Technician Patient Advocate Specialist Lead [facility] Pharmacy Patient Advocate Team Direct Number: [phone]  Fax: [phone]

## 2023-10-18 NOTE — Plan of Care (Signed)
 Problem: Education: Goal: Knowledge of General Education information will improve Description: Including pain rating scale, medication(s)/side effects and non-pharmacologic comfort measures 10/18/2023 1013 by Marylu Randine NOVAK, RN Outcome: Adequate for Discharge 10/18/2023 1009 by Marylu Randine NOVAK, RN Outcome: Progressing   Problem: Health Behavior/Discharge Planning: Goal: Ability to manage health-related needs will improve 10/18/2023 1013 by Marylu Randine NOVAK, RN Outcome: Adequate for Discharge 10/18/2023 1009 by Marylu Randine NOVAK, RN Outcome: Progressing   Problem: Clinical Measurements: Goal: Ability to maintain clinical measurements within normal limits will improve 10/18/2023 1013 by Marylu Randine NOVAK, RN Outcome: Adequate for Discharge 10/18/2023 1009 by Marylu Randine NOVAK, RN Outcome: Progressing Goal: Will remain free from infection 10/18/2023 1013 by Marylu Randine NOVAK, RN Outcome: Adequate for Discharge 10/18/2023 1009 by Marylu Randine NOVAK, RN Outcome: Progressing Goal: Diagnostic test results will improve 10/18/2023 1013 by Marylu Randine NOVAK, RN Outcome: Adequate for Discharge 10/18/2023 1009 by Marylu Randine NOVAK, RN Outcome: Progressing Goal: Respiratory complications will improve 10/18/2023 1013 by Marylu Randine NOVAK, RN Outcome: Adequate for Discharge 10/18/2023 1009 by Marylu Randine NOVAK, RN Outcome: Progressing Goal: Cardiovascular complication will be avoided 10/18/2023 1013 by Marylu Randine NOVAK, RN Outcome: Adequate for Discharge 10/18/2023 1009 by Marylu Randine NOVAK, RN Outcome: Progressing   Problem: Activity: Goal: Risk for activity intolerance will decrease 10/18/2023 1013 by Marylu Randine NOVAK, RN Outcome: Adequate for Discharge 10/18/2023 1009 by Marylu Randine NOVAK, RN Outcome: Progressing   Problem: Nutrition: Goal: Adequate nutrition will be maintained 10/18/2023 1013 by Marylu Randine NOVAK, RN Outcome: Adequate for Discharge 10/18/2023 1009 by Marylu Randine NOVAK, RN Outcome: Progressing    Problem: Coping: Goal: Level of anxiety will decrease 10/18/2023 1013 by Marylu Randine NOVAK, RN Outcome: Adequate for Discharge 10/18/2023 1009 by Marylu Randine NOVAK, RN Outcome: Progressing   Problem: Elimination: Goal: Will not experience complications related to bowel motility 10/18/2023 1013 by Marylu Randine NOVAK, RN Outcome: Adequate for Discharge 10/18/2023 1009 by Marylu Randine NOVAK, RN Outcome: Progressing Goal: Will not experience complications related to urinary retention 10/18/2023 1013 by Marylu Randine NOVAK, RN Outcome: Adequate for Discharge 10/18/2023 1009 by Marylu Randine NOVAK, RN Outcome: Progressing   Problem: Pain Managment: Goal: General experience of comfort will improve and/or be controlled 10/18/2023 1013 by Marylu Randine NOVAK, RN Outcome: Adequate for Discharge 10/18/2023 1009 by Marylu Randine NOVAK, RN Outcome: Progressing   Problem: Safety: Goal: Ability to remain free from injury will improve 10/18/2023 1013 by Marylu Randine NOVAK, RN Outcome: Adequate for Discharge 10/18/2023 1009 by Marylu Randine NOVAK, RN Outcome: Progressing   Problem: Skin Integrity: Goal: Risk for impaired skin integrity will decrease 10/18/2023 1013 by Marylu Randine NOVAK, RN Outcome: Adequate for Discharge 10/18/2023 1009 by Marylu Randine NOVAK, RN Outcome: Progressing   Problem: Education: Goal: Understanding of cardiac disease, CV risk reduction, and recovery process will improve 10/18/2023 1013 by Marylu Randine NOVAK, RN Outcome: Adequate for Discharge 10/18/2023 1009 by Marylu Randine NOVAK, RN Outcome: Progressing Goal: Individualized Educational Video(s) 10/18/2023 1013 by Marylu Randine NOVAK, RN Outcome: Adequate for Discharge 10/18/2023 1009 by Marylu Randine NOVAK, RN Outcome: Progressing   Problem: Activity: Goal: Ability to tolerate increased activity will improve 10/18/2023 1013 by Marylu Randine NOVAK, RN Outcome: Adequate for Discharge 10/18/2023 1009 by Marylu Randine NOVAK, RN Outcome: Progressing   Problem: Cardiac: Goal:  Ability to achieve and maintain adequate cardiovascular perfusion will improve 10/18/2023 1013 by Marylu Randine NOVAK, RN Outcome: Adequate for Discharge 10/18/2023 1009 by Marylu Randine NOVAK, RN Outcome: Progressing  Problem: Health Behavior/Discharge Planning: Goal: Ability to safely manage health-related needs after discharge will improve 10/18/2023 1013 by Marylu Randine NOVAK, RN Outcome: Adequate for Discharge 10/18/2023 1009 by Marylu Randine NOVAK, RN Outcome: Progressing   Problem: Education: Goal: Understanding of CV disease, CV risk reduction, and recovery process will improve 10/18/2023 1013 by Marylu Randine NOVAK, RN Outcome: Adequate for Discharge 10/18/2023 1009 by Marylu Randine NOVAK, RN Outcome: Progressing Goal: Individualized Educational Video(s) 10/18/2023 1013 by Marylu Randine NOVAK, RN Outcome: Adequate for Discharge 10/18/2023 1009 by Marylu Randine NOVAK, RN Outcome: Progressing   Problem: Activity: Goal: Ability to return to baseline activity level will improve 10/18/2023 1013 by Marylu Randine NOVAK, RN Outcome: Adequate for Discharge 10/18/2023 1009 by Marylu Randine NOVAK, RN Outcome: Progressing   Problem: Cardiovascular: Goal: Ability to achieve and maintain adequate cardiovascular perfusion will improve 10/18/2023 1013 by Marylu Randine NOVAK, RN Outcome: Adequate for Discharge 10/18/2023 1009 by Marylu Randine NOVAK, RN Outcome: Progressing Goal: Vascular access site(s) Level 0-1 will be maintained 10/18/2023 1013 by Marylu Randine NOVAK, RN Outcome: Adequate for Discharge 10/18/2023 1009 by Marylu Randine NOVAK, RN Outcome: Progressing   Problem: Health Behavior/Discharge Planning: Goal: Ability to safely manage health-related needs after discharge will improve 10/18/2023 1013 by Marylu Randine NOVAK, RN Outcome: Adequate for Discharge 10/18/2023 1009 by Marylu Randine NOVAK, RN Outcome: Progressing   Problem: Education: Goal: Ability to describe self-care measures that may prevent or decrease complications (Diabetes  Survival Skills Education) will improve 10/18/2023 1013 by Marylu Randine NOVAK, RN Outcome: Adequate for Discharge 10/18/2023 1009 by Marylu Randine NOVAK, RN Outcome: Progressing Goal: Individualized Educational Video(s) 10/18/2023 1013 by Marylu Randine NOVAK, RN Outcome: Adequate for Discharge 10/18/2023 1009 by Marylu Randine NOVAK, RN Outcome: Progressing   Problem: Coping: Goal: Ability to adjust to condition or change in health will improve 10/18/2023 1013 by Marylu Randine NOVAK, RN Outcome: Adequate for Discharge 10/18/2023 1009 by Marylu Randine NOVAK, RN Outcome: Progressing   Problem: Fluid Volume: Goal: Ability to maintain a balanced intake and output will improve 10/18/2023 1013 by Marylu Randine NOVAK, RN Outcome: Adequate for Discharge 10/18/2023 1009 by Marylu Randine NOVAK, RN Outcome: Progressing   Problem: Health Behavior/Discharge Planning: Goal: Ability to identify and utilize available resources and services will improve 10/18/2023 1013 by Marylu Randine NOVAK, RN Outcome: Adequate for Discharge 10/18/2023 1009 by Marylu Randine NOVAK, RN Outcome: Progressing Goal: Ability to manage health-related needs will improve 10/18/2023 1013 by Marylu Randine NOVAK, RN Outcome: Adequate for Discharge 10/18/2023 1009 by Marylu Randine NOVAK, RN Outcome: Progressing   Problem: Metabolic: Goal: Ability to maintain appropriate glucose levels will improve 10/18/2023 1013 by Marylu Randine NOVAK, RN Outcome: Adequate for Discharge 10/18/2023 1009 by Marylu Randine NOVAK, RN Outcome: Progressing   Problem: Nutritional: Goal: Maintenance of adequate nutrition will improve 10/18/2023 1013 by Marylu Randine NOVAK, RN Outcome: Adequate for Discharge 10/18/2023 1009 by Marylu Randine NOVAK, RN Outcome: Progressing Goal: Progress toward achieving an optimal weight will improve 10/18/2023 1013 by Marylu Randine NOVAK, RN Outcome: Adequate for Discharge 10/18/2023 1009 by Marylu Randine NOVAK, RN Outcome: Progressing   Problem: Skin Integrity: Goal: Risk for impaired  skin integrity will decrease 10/18/2023 1013 by Marylu Randine NOVAK, RN Outcome: Adequate for Discharge 10/18/2023 1009 by Marylu Randine NOVAK, RN Outcome: Progressing   Problem: Tissue Perfusion: Goal: Adequacy of tissue perfusion will improve 10/18/2023 1013 by Marylu Randine NOVAK, RN Outcome: Adequate for Discharge 10/18/2023 1009 by Marylu Randine NOVAK, RN Outcome: Progressing

## 2023-10-19 ENCOUNTER — Telehealth: Payer: Self-pay

## 2023-10-19 ENCOUNTER — Telehealth (HOSPITAL_COMMUNITY): Payer: Self-pay

## 2023-10-19 ENCOUNTER — Telehealth: Payer: Self-pay | Admitting: Cardiology

## 2023-10-19 NOTE — Transitions of Care (Post Inpatient/ED Visit) (Signed)
 10/19/2023  Name: Jonathan Fischer MRN: [id number] DOB: 03-07-1970  Today's TOC FU Call Status: Today's TOC FU Call Status:: Successful TOC FU Call Completed TOC FU Call Complete Date: 10/19/23 Patient's Name and Date of Birth confirmed.  Transition Care Management Follow-up Telephone Call Date of Discharge: 10/18/23 Discharge Facility: [facility] [place]) Type of Discharge: Inpatient Admission Primary Inpatient Discharge Diagnosis:: ST elevation myocardial infarction How have you been since you were released from the hospital?: Better Any questions or concerns?: No  Items Reviewed: Did you receive and understand the discharge instructions provided?: Yes Medications obtained,verified, and reconciled?: Yes (Medications Reviewed) Any new allergies since your discharge?: No Dietary orders reviewed?: Yes Type of Diet Ordered:: Heart Healthy low sodium carb modified Do you have support at home?: Yes People in Home [RPT]: child(ren), adult, spouse  Medications Reviewed Today: Medications Reviewed Today     Reviewed by Keziyah Kneale, RN (Case Manager) on 10/19/23 at 1234  Med List Status: <None>   Medication Order Taking? Sig Documenting Provider Last Dose Status Informant  aspirin  EC 81 MG EC tablet [id number] Yes Take 1 tablet (81 mg total) by mouth daily. Walt Sharolyn LABOR, PA-C  Active Self, Spouse/Significant Other, Pharmacy Records           Med Note (COFFELL, JON HERO   Sun Oct 16, 2023  9:45 AM) Prior to EMS arrival, 324mg  taken at home but vomited soon after.  atorvastatin  (LIPITOR ) 80 MG tablet [id number] Yes Take 1 tablet (80 mg total) by mouth daily. Madie JON Garre, PA  Active   empagliflozin  (JARDIANCE ) 10 MG TABS tablet [id number] Yes Take 1 tablet (10 mg total) by mouth daily. Madie JON Garre, PA  Active   insulin degludec (TRESIBA FLEXTOUCH) 100 UNIT/ML FlexTouch Pen [id number] Yes Inject 30 Units into the skin daily. Madie JON Garre, PA  Active   metFORMIN  (GLUCOPHAGE) 500 MG tablet [id number]  Take 1 tablet (500 mg total) by mouth 2 (two) times daily with a meal. Duke, JON Garre, PA  Active   metoprolol  succinate (TOPROL -XL) 25 MG 24 hr tablet [id number] Yes Take 1 tablet (25 mg total) by mouth daily. Madie JON Garre, PA  Active   nicotine (NICODERM CQ - DOSED IN MG/24 HOURS) 14 mg/24hr patch [id number] Yes Place 1 patch (14 mg total) onto the skin daily. Madie JON Garre, PA  Active   nitroGLYCERIN  (NITROSTAT ) 0.4 MG SL tablet [id number] Yes Place 1 tablet (0.4 mg total) under the tongue every 5 (five) minutes as needed. For chest pain Fernande Elspeth BROCKS, MD  Active Self, Spouse/Significant Other, Pharmacy Records           Med Note (COFFELL, JON HERO   Sun Oct 16, 2023  9:53 AM) 3 doses taken from home supply, prior to EMS arrival.  pantoprazole  (PROTONIX ) 40 MG tablet [id number] Yes TAKE 1 TABLET ONE TIME DAILY (NEED MD APPOINTMENT) Fernande Elspeth BROCKS, MD  Active Self, Spouse/Significant Other, Pharmacy Records           Med Note (COFFELL, JON HERO   Sun Oct 16, 2023  9:55 AM) No fill history found in the past 12 months on dispense report or DrFirst.  sacubitril -valsartan  (ENTRESTO ) 24-26 MG [id number] Yes Take 1 tablet by mouth 2 (two) times daily. Madie JON Garre, PA  Active   spironolactone  (ALDACTONE ) 25 MG tablet [id number] Yes Take 0.5 tablets (12.5 mg total) by mouth daily. Madie JON Garre, PA  Active   ticagrelor (BRILINTA) 90 MG TABS  tablet [id number] Yes Take 1 tablet (90 mg total) by mouth 2 (two) times daily. Madie Jon Garre, PA  Active             Home Care and Equipment/Supplies: Were Home Health Services Ordered?: NA Any new equipment or medical supplies ordered?: NA  Functional Questionnaire: Do you need assistance with bathing/showering or dressing?: No Do you need assistance with meal preparation?: No Do you need assistance with eating?: No Do you have difficulty maintaining continence: No Do you need assistance  with getting out of bed/getting out of a chair/moving?: No Do you have difficulty managing or taking your medications?: No  Follow up appointments reviewed: PCP Follow-up appointment confirmed?: No (Patient to call PCP) MD Provider Line [phone] Given: No Specialist Hospital Follow-up appointment confirmed?: Yes Date of Specialist follow-up appointment?: 10/25/23 Follow-Up Specialty Provider:: Dr. [place] Do you need transportation to your follow-up appointment?: No Do you understand care options if your condition(s) worsen?: Yes-patient verbalized understanding  SDOH Interventions Today    Flowsheet Row Most Recent Value  SDOH Interventions   Food Insecurity Interventions Intervention Not Indicated  Housing Interventions Intervention Not Indicated  Transportation Interventions Intervention Not Indicated  Utilities Interventions Intervention Not Indicated    Megha Agnes J. Mccoy Testa RN, MSN [facility]  [facility], [facility] RN Care Manager Direct Dial: [phone]  Fax: [phone] Website: [email or web address]

## 2023-10-19 NOTE — Telephone Encounter (Signed)
 Spoke with patient and his spouse Sydelle, informed him Dr. Wonda and Dr. [place] recommend he resume metformin and follow-up with PCP for further diabetes management. Patient verbalized understanding.  Patient has appt with APP on 10/25/23 and will discuss medications at that visit.

## 2023-10-19 NOTE — Telephone Encounter (Signed)
 Wife called and wanted advice on whether patient should start Metformin. Per spouse pt was taking off of Metformin years ago and at discharge to be restart on 10/9. Per patient and wife  would like to know whether to start medication again. Made spouse aware this will be forward to provider for advice and recommendations on restarting Metformin twice a day.

## 2023-10-19 NOTE — Telephone Encounter (Signed)
 Called and left message for patient and spouse to call office. Left message this will be sent to provider for advice and recommendations. Patient and spouse would like advice on whether patient should be taking metoprolol  and would like advice from provider whether to resume or discontinue.  Left message that this will be forward to provider for advice.

## 2023-10-19 NOTE — Telephone Encounter (Signed)
 Pt c/o medication issue:  1. Name of Medication:  metFORMIN (GLUCOPHAGE) 500 MG tablet  metoprolol  succinate (TOPROL -XL) 25 MG 24 hr tablet ticagrelor (BRILINTA) 90 MG TABS tablet  sacubitril -valsartan  (ENTRESTO ) 24-26 MG    2. How are you currently taking this medication (dosage and times per day)?   3. Are you having a reaction (difficulty breathing--STAT)?   4. What is your medication issue? They started him on the above medication, and they have questions on it.  She states they stopped his plavix , lisinopril  and carvedilol .  She stated medformin Dr. Swaziland had told him in the past not to take, and the metoprolol  he was on it before and was taken of it and now put back on it.

## 2023-10-19 NOTE — Telephone Encounter (Signed)
 Phase II referral, patient stated he is not interested. Closed referral.

## 2023-10-19 NOTE — Telephone Encounter (Signed)
 He was asked to resume metformin although he remained reluctant saying he was told not to be on metformin in the past. I do not see any contraindication.   Thanks MJP

## 2023-10-19 NOTE — Telephone Encounter (Signed)
Pt returning call, call transferred

## 2023-10-19 NOTE — Patient Instructions (Signed)
 Visit Information  Thank you for taking time to visit with me today.  When to call your doctor Contact your health care provider right away if you have: Chest pain or a return of the symptoms you had before the angioplasty that are promptly relieved with rest or medicines. Fever above  100.4 F ( 38C) (or 1 degree or higher above your normal temperature); or other signs of infection (redness, swelling, drainage, or warmth at the incision site of the leg or wrist). Bleeding, bruising, or a large swelling where the catheter (tube) was inserted. Call 911 if you have symptoms such as: Unusual chest pain or chest pain that doesn't go away. Symptoms that you had before the angioplasty return and aren't eased with rest or medicines. Constant or increasing pain or numbness in your leg, or your leg looks blue or feels cold. Unusual shortness of breath. Feeling faint. Trouble speaking or weakness in any muscle. Blood in your urine; bloody, black, or tarry stools; or any other kind of bleeding.   The patient verbalized understanding of instructions, educational materials, and care plan provided today and DECLINED offer to receive copy of patient instructions, educational materials, and care plan.   The patient has been provided with contact information for the care management team and has been advised to call with any health related questions or concerns.   Please call the care guide team at [phone] if you need to cancel or reschedule your appointment.   Please call the Suicide and Crisis Lifeline: 988 if you are experiencing a Mental Health or Behavioral Health Crisis or need someone to talk to.  Kehinde Totzke J. Lillymae Duet RN, MSN [facility], [facility] Health RN Care Manager Direct Dial: [phone]  Fax: [phone] Website: [email or web address]

## 2023-10-21 ENCOUNTER — Telehealth: Payer: Self-pay | Admitting: Cardiology

## 2023-10-21 NOTE — Telephone Encounter (Signed)
 Christina with [facility] pharmacy says they recommend starting the patient on anticoagulation therapy. She requested call be returned to patient to discuss next steps. If questions, call may be returned to [facility] at [phone].

## 2023-10-24 NOTE — Telephone Encounter (Signed)
 Spoke with the pharmacist Lavern at [facility] and advised on the following her hospital discharge note:   Afib with RVR Second heart cath complicated by Afib with RVR treated with IV amiodarone boluses and IV lopressor . He converted and remains in SR. No OAC planned.

## 2023-10-24 NOTE — Progress Notes (Unsigned)
 Cardiology Office Note:    Date:  10/25/2023   ID:  Jonathan Fischer, DOB [birth year], MRN [id number]  PCP:  Gerome Brunet, DO   [facility] Providers Cardiologist:  Peter Swaziland, MD Electrophysiologist:  Fonda Kitty, MD     Referring MD: Gerome Brunet, DO   Chief complaint: Follow-up STEMI admission  History of Present Illness:    Jonathan Fischer is a 53 y.o. male with a hx of CAD s/p multiple PCIs, ICM s/p ICD, VT, HTN, HLD, presenting to the office for follow-up of recent hospital admission secondary to STEMI.  Remote STEMI in 2010, 2013, VT in 2014 with ICD discharge where echo showed ICM w/ CHF and EF of 30%.  NM Lexiscan  study 10/2012 showed large scarring of entire apex, mid anterior wall, mid septum, and mid anterolateral wall, LVEF 36%, with severe hypokinesis of entire apex, mid anterior wall, mid septum, mid anterolateral wall, no ischemia observed.   OV follow-up in 07/2016 patient complaining of chest pain at rest and with exertion.  LHC 07/19/2016 revealed a 95% mid RCA lesion treated with Cutting Balloon angioplasty.  EP follow-up 02/2018 patient c/o exertional chest heaviness with SOB, relieved with rest, worsening X 6 months, with reports of palpitations and ICD documented nonsustained VT.  Subsequent LHC 03/2018 showed single-vessel obstructive CAD involving distal LAD, all prior stents widely patent, no intervention performed. 09/2020 evaluated for ICD discharge for VT.  Patient was asymptomatic at 02/2022 OV follow-up, unable to afford Entresto  with an EF 30-35%, had continued to smoke 2 PPD at that time without motivation to quit.  10/16/2023 patient arrived to ED via EMS c/o acute onset substernal chest pain occurring following intercourse.  Patient took multiple doses of SL nitroglycerin  which did not improve his symptoms.  EKG on EMS arrival showed inferior ST elevation.  LHC showed severe stenosis at distal stent edge before bifurcation of OM 2  subbranches successfully treated with 2.5 X12 DES.  Severe stenosis at [facility] previous stent edge estimated at 90%, LVEDP 21 mmHg. Planned for staged PCI of the LAD and RCA vessels.   He returned to cath lab 10/17/23 and underwent successful PCI/DES 3.5 x 16 mm to RCA. Resolution of thrombotic occlusion in the proximal LAD was noted. Second heart cath complicated by Afib with RVR treated with IV amiodarone boluses and IV lopressor . He converted and remains in SR. No OAC planned. Echo this admission with chronically reduced LVEF 30-35%, WMA noted, grade 2 DD, normal RV, mild MR, mild AI.  Plavix  was transitioned to brilinta. He was discharged on DAPT, 80 mg lipitor .  Patient presents today with his significant other, appears to be doing well from a cardiac standpoint. He denies chest pain, palpitations, dyspnea, orthopnea, n, v,  dark/tarry/bloody stools, hematuria, dizziness, syncope, edema, weight gain, excessive bleeding/bruising/rash.  Denies missing any doses of DAPT.  Checks his weight daily at home, weight ranges from 205-210lbs per week.  Reports since his recent STEMI he has not smoked at all.  Has been managing cravings with nicotine patches and nicotine gum.  Reports over the last year he has lost 60 pounds in the last year.  Appears motivated to improve health following most recent STEMI.  Major concern today revolves around the price of new medications, primarily Entresto  and Brilinta.  Patient admits to not having been compliant with his regular medications, including Plavix , prior to his STEMI, and believes that the Plavix  worked when he took it regularly and is requesting  he be transitioned from Brilinta to Plavix . He enjoys golf on the weekends.   Past Medical History:  Diagnosis Date   Abnormal echocardiogram    a. 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   AICD (automatic  cardioverter/defibrillator) present 11/2009   Arthritis    AUTOMATIC IMPLANTABLE CARDIAC DEFIBRILLATOR SITU 03/03/2010   Qualifier: Diagnosis of  By: Fernande, MD, [facility], Elspeth Darner    CAD (coronary artery disease)    s/p prior LAD, LCx and RCA stenting remotely for an anterior MI, recurrent MI in 2010 with LAD stent occlusion s/p thrombectomy and PTCA, STEMI 12/2011 s/p DES-prox LAD   Cardiomyopathy, ischemic 12/14/2011   Chronic systolic CHF (congestive heart failure) (HCC)    Headache    migraines   HTN (hypertension)    Hyperlipidemia    ICD (implantable cardiac defibrillator) in place    Ischemic cardiomyopathy    EF 25-30% s/p single-chamber Medtronic ICD    NSVT (nonsustained ventricular tachycardia) (HCC) 12/14/2011   Obesity    STEMI (ST elevation myocardial infarction) (HCC) 12/12/2011   Tobacco abuse    TOBACCO USER 10/29/2009   Qualifier: Diagnosis of  By: Fernande, MD, CODY Elspeth Darner    Tooth pain 12/14/2011   VT (ventricular tachycardia) (HCC) 10/18/2012    Past Surgical History:  Procedure Laterality Date   CARDIAC CATHETERIZATION  03/31/2009   CARDIAC CATHETERIZATION  05/08/2008   CARDIAC SIZE AND SILHOUETTE NORMAL. THERE WAS ANTERIOR APICAL HYPOKINESIS WITH EF 35-40%   CARDIAC CATHETERIZATION  03/14/2018   CARDIAC DEFIBRILLATOR PLACEMENT  11/2009   CORONARY ANGIOPLASTY  07/19/2016   BALLOON ANGIOPLASTY TO THE LAD WITH THROMBUS EXTRACTION OF THE PREVIOUSLY STENTED SEGMENT AND NEW STENT PLACEMENT TO THE LEFT CIRCUMFLEX. EF IS DOWN IN THE 30% RANGE   CORONARY ANGIOPLASTY WITH STENT PLACEMENT  12/12/2011   30% mid LCx, mid RCA & mid LCx stents patent. LVEF 25-30%, mid-distal anterolateral wall AK, apex dilated, dyskinetic s/p DES-prox LAD stenosis.   CORONARY BALLOON ANGIOPLASTY N/A 07/19/2016   Procedure: Coronary Balloon Angioplasty;  Surgeon: Swaziland, Peter M, MD;  Location: [facility] INVASIVE CV LAB;  Service: Cardiovascular;  Laterality: N/A;   CORONARY STENT  INTERVENTION N/A 10/17/2023   Procedure: CORONARY STENT INTERVENTION;  Surgeon: Wonda Sharper, MD;  Location: [facility] INVASIVE CV LAB;  Service: Cardiovascular;  Laterality: N/A;   CORONARY/GRAFT ACUTE MI REVASCULARIZATION N/A 10/16/2023   Procedure: Coronary/Graft Acute MI Revascularization;  Surgeon: Wonda Sharper, MD;  Location: [facility] INVASIVE CV LAB;  Service: Cardiovascular;  Laterality: N/A;   defib     HAND SURGERY  1994   right hand   ICD GENERATOR CHANGEOUT N/A 05/19/2018   Procedure: ICD GENERATOR CHANGEOUT;  Surgeon: Inocencio Soyla Lunger, MD;  Location: [facility] INVASIVE CV LAB;  Service: Cardiovascular;  Laterality: N/A;   LEFT HEART CATH Bilateral 12/12/2011   Procedure: LEFT HEART CATH;  Surgeon: Peter M Swaziland, MD;  Location: [facility] CATH LAB;  Service: Cardiovascular;  Laterality: Bilateral;   LEFT HEART CATH AND CORONARY ANGIOGRAPHY N/A 07/19/2016   Procedure: Left Heart Cath and Coronary Angiography;  Surgeon: Swaziland, Peter M, MD;  Location: [facility] INVASIVE CV LAB;  Service: Cardiovascular;  Laterality: N/A;   LEFT HEART CATH AND CORONARY ANGIOGRAPHY N/A 03/14/2018   Procedure: LEFT HEART CATH AND CORONARY ANGIOGRAPHY;  Surgeon: Swaziland, Peter M, MD;  Location: [facility] INVASIVE CV LAB;  Service: Cardiovascular;  Laterality: N/A;   LEFT HEART  CATH AND CORONARY ANGIOGRAPHY N/A 10/16/2023   Procedure: LEFT HEART CATH AND CORONARY ANGIOGRAPHY;  Surgeon: Wonda Sharper, MD;  Location: [facility] INVASIVE CV LAB;  Service: Cardiovascular;  Laterality: N/A;    Current Medications: Current Meds  Medication Sig   aspirin  EC 81 MG EC tablet Take 1 tablet (81 mg total) by mouth daily.   atorvastatin  (LIPITOR ) 80 MG tablet Take 1 tablet (80 mg total) by mouth daily.   empagliflozin  (JARDIANCE ) 10 MG TABS tablet Take 1 tablet (10 mg total) by mouth daily.   insulin degludec (TRESIBA FLEXTOUCH) 100 UNIT/ML FlexTouch Pen Inject 30 Units into the skin daily.   metFORMIN (GLUCOPHAGE) 500 MG tablet Take 1 tablet (500 mg total) by  mouth 2 (two) times daily with a meal.   metoprolol  succinate (TOPROL -XL) 25 MG 24 hr tablet Take 1 tablet (25 mg total) by mouth daily.   nicotine (NICODERM CQ - DOSED IN MG/24 HOURS) 14 mg/24hr patch Place 1 patch (14 mg total) onto the skin daily.   nitroGLYCERIN  (NITROSTAT ) 0.4 MG SL tablet Place 1 tablet (0.4 mg total) under the tongue every 5 (five) minutes as needed. For chest pain   pantoprazole  (PROTONIX ) 40 MG tablet TAKE 1 TABLET ONE TIME DAILY (NEED MD APPOINTMENT)   sacubitril -valsartan  (ENTRESTO ) 24-26 MG Take 1 tablet by mouth 2 (two) times daily.   spironolactone  (ALDACTONE ) 25 MG tablet Take 0.5 tablets (12.5 mg total) by mouth daily.   ticagrelor (BRILINTA) 90 MG TABS tablet Take 1 tablet (90 mg total) by mouth 2 (two) times daily.     Allergies:   Patient has no known allergies.   Social History   Socioeconomic History   Marital status: Married    Spouse name: [name]   Number of children: 3   Years of education: Not on file   Highest education level: 11th grade  Occupational History   Occupation: disability  Tobacco Use   Smoking status: Every Day    Current packs/day: 1.00    Average packs/day: 1 pack/day for 24.0 years (24.0 ttl pk-yrs)    Types: E-cigarettes, Cigarettes   Smokeless tobacco: Never  Vaping Use   Vaping status: Never Used  Substance and Sexual Activity   Alcohol use: Yes    Comment: Occassionally   Drug use: No   Sexual activity: Yes  Other Topics Concern   Not on file  Social History Narrative   Not on file   Social Drivers of Health   Financial Resource Strain: Low Risk  (10/18/2023)   Overall Financial Resource Strain (CARDIA)    Difficulty of Paying Living Expenses: Not very hard  Food Insecurity: No Food Insecurity (10/19/2023)   Hunger Vital Sign    Worried About Running Out of Food in the Last Year: Never true    Ran Out of Food in the Last Year: Never true  Transportation Needs: No Transportation Needs (10/19/2023)   PRAPARE -  Administrator, Civil Service (Medical): No    Lack of Transportation (Non-Medical): No  Physical Activity: Not on file  Stress: Not on file  Social Connections: Not on file     Family History: The patient's family history includes Cancer in his mother; Heart disease in his father.  ROS:   Please see the history of present illness.     All other systems reviewed and are negative.  EKGs/Labs/Other Studies Reviewed:    The following studies were reviewed today:  EKG Interpretation Date/Time:  Tuesday October 25 2023  08:42:11 EDT Ventricular Rate:  58 PR Interval:  148 QRS Duration:  92 QT Interval:  434 QTC Calculation: 426 R Axis:   102  Text Interpretation: Sinus bradycardia Inferior infarct (cited on or before 21-Jul-2023) Anterolateral infarct (cited on or before 21-Jul-2023) No significant changes since prior studies Confirmed by Williamson Cavanah [phone]) on 10/25/2023 9:00:01 AM    Recent Labs: 10/16/2023: ALT 18 10/18/2023: BUN 13; Creatinine, Ser 0.69; Hemoglobin 17.8; Magnesium 2.1; Platelets 238; Potassium 3.4; Sodium 135  Recent Lipid Panel    Component Value Date/Time   CHOL 179 10/16/2023 0100   CHOL 231 (H) 04/07/2021 1222   TRIG 191 (H) 10/16/2023 0100   HDL 26 (L) 10/16/2023 0100   HDL 29 (L) 04/07/2021 1222   CHOLHDL 6.9 10/16/2023 0100   VLDL 38 10/16/2023 0100   LDLCALC 115 (H) 10/16/2023 0100   LDLCALC 125 (H) 04/07/2021 1222     Risk Assessment/Calculations:    CHA2DS2-VASc Score = 4   This indicates a 4.8% annual risk of stroke. The patient's score is based upon: CHF History: 1 HTN History: 1 Diabetes History: 1 Stroke History: 0 Vascular Disease History: 1 Age Score: 0 Gender Score: 0      Physical Exam:    VS:  BP 116/70   Pulse (!) 58   Ht 6' 1 (1.854 m)   Wt 212 lb 9.6 oz (96.4 kg)   SpO2 95%   BMI 28.05 kg/m     Wt Readings from Last 3 Encounters:  10/25/23 212 lb 9.6 oz (96.4 kg)  10/17/23 211 lb 12.8 oz  (96.1 kg)  07/21/23 209 lb 12.8 oz (95.2 kg)     GEN:  Well nourished, well developed in no acute distress HEENT: Normal NECK: No JVD; No carotid bruits CARDIAC: RRR, no murmurs, rubs, gallops RESPIRATORY:  Clear to auscultation without rales, wheezing or rhonchi  ABDOMEN: Soft, non-tender, non-distended MUSCULOSKELETAL:  No edema; No deformity  SKIN: Warm and dry NEUROLOGIC:  Alert and oriented x 3 PSYCHIATRIC:  Normal affect   ASSESSMENT:    1. Coronary artery disease involving native coronary artery of native heart, unspecified whether angina present   2. PAF (paroxysmal atrial fibrillation) (HCC)   3. Chronic systolic congestive heart failure (HCC)   4. VT (ventricular tachycardia) (HCC)   5. Ischemic cardiomyopathy with implantable cardioverter-defibrillator (ICD)   6. Type 2 diabetes mellitus with other specified complication, unspecified whether long term insulin use (HCC)    PLAN:    In order of problems listed above:  CAD s/p PCI EKG: Sinus bradycardia, 58 bpm, no significant change since prior study Right radial site appears well healed, no hematoma/ecchymosis, 2+ radial pulse palpable across site Denies CP, SOB, palpitations, near syncope Patient states he has not missed any doses of his DAPT  Continue aspirin  EC 81 mg daily Continue atorvastatin  80 mg daily Continue Toprol  XL 25 mg daily Continue nitroglycerin  0.4 mg SL as needed chest pain every 5 minutes Continue Brilinta 90 mg twice daily Will reach out to Dr. Wonda who performed both PCIs and discuss possibility of transitioning from Brillinta to Plavix  moving forward. Addendum 10/26/2023: Spoke with Izetta Hummer, PA-C, who is covering Dr. Margurite inbox while he is out of the office. She discussed this with Dr. Swaziland. We are okay to proceed forward with transitioning patient's brillinta to plavix  once he finishes his current supply of brillinta. Patient will require an initial one time loading dose of 300  mg the day after his  last PM dose of Brillinta, then will take 75 mg once daily everyday thereafter. Will call the patient and update him on the plan moving forward.   Paroxysmal atrial fibrillation Isolated event occurring during PCI of RCA on 10/17/2023 Converted following IV amiodarone and Lopressor  Remains in sinus rhythm today No plans for [facility] at this time Will continue to monitor  Chronic systolic heart failure Ischemic cardiomyopathy with V. Tach / ICD Echo 10/16/2023: Chronically reduced LVEF 30-35% with moderate-severely decreased function, RWMA present, severely dilated LV internal cavity, G2 DD, moderately enlarged RV, mild MI, mild AI, mild dilatation of ascending aorta (41 mm), RA pressure 15 mmHg. Denies SOB, weight changes, edema, palpitations, near syncope Appears euvolemic on exam Weight remains stable between 205-210 lbs on home scale, checks daily Denies weight changes of >3 lb overnight, or >5lb in a week. Was instructed to call our office if this develops Continue Jardiance  10 mg daily Continue Toprol  XL 25 mg daily Continue Entresto  24-26 mg twice daily Continue spironolactone  12.5 mg daily Patient questioning the need for entresto  2/2 cost as he feels the medication may put him in the donut hole.  Will refer to pharmaceutical team to provide cost assistance with this medication moving forward.  Will order BMP to evaluate electrolytes and kidney function  HTN Does not regularly check at home, reports not having noticed problems with in it in the past BP in office well controlled today Continue Toprol  XL 25 mg daily Continue Entresto  24-26 mg twice daily Continue spironolactone  12.5 mg daily  HLD LDL goal <55 10/16/2023: Cholesterol 179; HDL 26; LDL Cholesterol 115; Triglycerides 191; VLDL 38, AST 16, ALT 18 Continue atorvastatin  80 mg daily  Follow up in three months w/ MD or APP  Medication Adjustments/Labs and Tests Ordered: Current medicines are reviewed at  length with the patient today.  Concerns regarding medicines are outlined above.  Orders Placed This Encounter  Procedures   Basic Metabolic Panel (BMET)   EKG 12-Lead   No orders of the defined types were placed in this encounter.   Patient Instructions  Medication Instructions:  Your physician recommends that you continue on your current medications as directed. Please refer to the Current Medication list given to you today. *If you need a refill on your cardiac medications before your next appointment, please call your pharmacy*  Lab Work: TODAY-BMET If you have labs (blood work) drawn today and your tests are completely normal, you will receive your results only by: MyChart Message (if you have MyChart) OR A paper copy in the mail If you have any lab test that is abnormal or we need to change your treatment, we will call you to review the results.  Testing/Procedures: NONE ORDERED  Follow-Up: At [facility], you and your health needs are our priority.  As part of our continuing mission to provide you with exceptional heart care, our providers are all part of one team.  This team includes your primary Cardiologist (physician) and Advanced Practice Providers or APPs (Physician Assistants and Nurse Practitioners) who all work together to provide you with the care you need, when you need it.  Your next appointment:   3 month(s)  Provider:   Peter Swaziland, MD or ANY APP   We recommend signing up for the patient portal called MyChart.  Sign up information is provided on this After Visit Summary.  MyChart is used to connect with patients for Virtual Visits (Telemedicine).  Patients are able to view lab/test results,  encounter notes, upcoming appointments, etc.  Non-urgent messages can be sent to your provider as well.   To learn more about what you can do with MyChart, go to [email or web address].   Other Instructions           Signed, Jonathan FORBES Shams, NP   10/25/2023 10:18 AM    [facility]

## 2023-10-25 ENCOUNTER — Encounter: Payer: Self-pay | Admitting: General Practice

## 2023-10-25 ENCOUNTER — Telehealth: Payer: Self-pay | Admitting: Pharmacy Technician

## 2023-10-25 ENCOUNTER — Other Ambulatory Visit (HOSPITAL_COMMUNITY): Payer: Self-pay

## 2023-10-25 ENCOUNTER — Ambulatory Visit: Attending: General Practice | Admitting: Emergency Medicine

## 2023-10-25 VITALS — BP 116/70 | HR 58 | Ht 73.0 in | Wt 212.6 lb

## 2023-10-25 DIAGNOSIS — I2510 Atherosclerotic heart disease of native coronary artery without angina pectoris: Secondary | ICD-10-CM | POA: Diagnosis not present

## 2023-10-25 DIAGNOSIS — Z95810 Presence of automatic (implantable) cardiac defibrillator: Secondary | ICD-10-CM | POA: Diagnosis not present

## 2023-10-25 DIAGNOSIS — I5022 Chronic systolic (congestive) heart failure: Secondary | ICD-10-CM | POA: Diagnosis not present

## 2023-10-25 DIAGNOSIS — I480 Paroxysmal atrial fibrillation: Secondary | ICD-10-CM | POA: Diagnosis not present

## 2023-10-25 DIAGNOSIS — I255 Ischemic cardiomyopathy: Secondary | ICD-10-CM | POA: Diagnosis not present

## 2023-10-25 DIAGNOSIS — I472 Ventricular tachycardia, unspecified: Secondary | ICD-10-CM | POA: Diagnosis not present

## 2023-10-25 DIAGNOSIS — E1169 Type 2 diabetes mellitus with other specified complication: Secondary | ICD-10-CM | POA: Diagnosis not present

## 2023-10-25 NOTE — Telephone Encounter (Signed)
 Patient Advocate Encounter   The patient was approved for a [employer] grant that will help cover the cost of Entresto  Total amount awarded, [phone].  Effective: 09/25/23 - 09/23/24   APW:[id number] ERW:[id number] Hmnle:[id number] PI:[id number]  [employer] ID: [id number]   Pharmacy provided with approval and processing information.  Added information to [facility]

## 2023-10-25 NOTE — Patient Instructions (Addendum)
 Medication Instructions:  Your physician recommends that you continue on your current medications as directed. Please refer to the Current Medication list given to you today. *If you need a refill on your cardiac medications before your next appointment, please call your pharmacy*  Lab Work: TODAY-BMET If you have labs (blood work) drawn today and your tests are completely normal, you will receive your results only by: MyChart Message (if you have MyChart) OR A paper copy in the mail If you have any lab test that is abnormal or we need to change your treatment, we will call you to review the results.  Testing/Procedures: NONE ORDERED  Follow-Up: At [facility], you and your health needs are our priority.  As part of our continuing mission to provide you with exceptional heart care, our providers are all part of one team.  This team includes your primary Cardiologist (physician) and Advanced Practice Providers or APPs (Physician Assistants and Nurse Practitioners) who all work together to provide you with the care you need, when you need it.  Your next appointment:   3 month(s)  Provider:   Peter [place], MD or ANY APP   We recommend signing up for the patient portal called MyChart.  Sign up information is provided on this After Visit Summary.  MyChart is used to connect with patients for Virtual Visits (Telemedicine).  Patients are able to view lab/test results, encounter notes, upcoming appointments, etc.  Non-urgent messages can be sent to your provider as well.   To learn more about what you can do with MyChart, go to [email or web address].   Other Instructions

## 2023-10-26 ENCOUNTER — Ambulatory Visit: Payer: Self-pay | Admitting: Emergency Medicine

## 2023-10-26 LAB — BASIC METABOLIC PANEL WITH GFR
BUN/Creatinine Ratio: 20 (ref 9–20)
BUN: 17 mg/dL (ref 6–24)
CO2: 23 mmol/L (ref 20–29)
Calcium: 9.4 mg/dL (ref 8.7–10.2)
Chloride: 103 mmol/L (ref 96–106)
Creatinine, Ser: 0.83 mg/dL (ref 0.76–1.27)
Glucose: 152 mg/dL — ABNORMAL HIGH (ref 70–99)
Potassium: 4.6 mmol/L (ref 3.5–5.2)
Sodium: 141 mmol/L (ref 134–144)
eGFR: 106 mL/min/1.73 (ref >59)

## 2023-10-26 MED ORDER — CLOPIDOGREL BISULFATE 75 MG PO TABS: ORAL_TABLET | ORAL | 3 refills | Status: AC

## 2023-10-26 NOTE — Addendum Note (Signed)
 Addended by: Syriah Delisi E on: 10/26/2023 10:35 AM   Modules accepted: Orders

## 2023-10-31 ENCOUNTER — Telehealth (HOSPITAL_COMMUNITY): Payer: Self-pay

## 2023-10-31 NOTE — Telephone Encounter (Signed)
 Called to confirm/remind patient of their appointment at the [facility] on 11/01/23 2:00.   Appointment:   [x] Confirmed  [] Left mess   [] No answer/No voice mail  [] VM Full/unable to leave message  [] Phone not in service  Patient reminded to bring all medications and/or complete list.  Confirmed patient has transportation. Gave directions, instructed to utilize valet parking.

## 2023-11-01 ENCOUNTER — Ambulatory Visit (HOSPITAL_COMMUNITY)
Admit: 2023-11-01 | Discharge: 2023-11-01 | Disposition: A | Discharge status: Home / Self Care | Attending: Cardiology | Admitting: Cardiology

## 2023-11-01 ENCOUNTER — Encounter (HOSPITAL_COMMUNITY): Payer: Self-pay

## 2023-11-01 VITALS — BP 118/58 | HR 64 | Ht 73.0 in | Wt 212.0 lb

## 2023-11-01 DIAGNOSIS — Z7902 Long term (current) use of antithrombotics/antiplatelets: Secondary | ICD-10-CM | POA: Insufficient documentation

## 2023-11-01 DIAGNOSIS — I5022 Chronic systolic (congestive) heart failure: Secondary | ICD-10-CM | POA: Diagnosis not present

## 2023-11-01 DIAGNOSIS — Z955 Presence of coronary angioplasty implant and graft: Secondary | ICD-10-CM | POA: Diagnosis not present

## 2023-11-01 DIAGNOSIS — I252 Old myocardial infarction: Secondary | ICD-10-CM | POA: Insufficient documentation

## 2023-11-01 DIAGNOSIS — Z95810 Presence of automatic (implantable) cardiac defibrillator: Secondary | ICD-10-CM | POA: Diagnosis not present

## 2023-11-01 DIAGNOSIS — I7121 Aneurysm of the ascending aorta, without rupture: Secondary | ICD-10-CM | POA: Diagnosis not present

## 2023-11-01 DIAGNOSIS — I480 Paroxysmal atrial fibrillation: Secondary | ICD-10-CM | POA: Insufficient documentation

## 2023-11-01 DIAGNOSIS — Z794 Long term (current) use of insulin: Secondary | ICD-10-CM | POA: Insufficient documentation

## 2023-11-01 DIAGNOSIS — I472 Ventricular tachycardia, unspecified: Secondary | ICD-10-CM | POA: Insufficient documentation

## 2023-11-01 DIAGNOSIS — I2510 Atherosclerotic heart disease of native coronary artery without angina pectoris: Secondary | ICD-10-CM | POA: Insufficient documentation

## 2023-11-01 DIAGNOSIS — E785 Hyperlipidemia, unspecified: Secondary | ICD-10-CM | POA: Diagnosis not present

## 2023-11-01 DIAGNOSIS — I10 Essential (primary) hypertension: Secondary | ICD-10-CM | POA: Diagnosis not present

## 2023-11-01 DIAGNOSIS — I255 Ischemic cardiomyopathy: Secondary | ICD-10-CM | POA: Insufficient documentation

## 2023-11-01 DIAGNOSIS — Z87891 Personal history of nicotine dependence: Secondary | ICD-10-CM | POA: Insufficient documentation

## 2023-11-01 DIAGNOSIS — Z7984 Long term (current) use of oral hypoglycemic drugs: Secondary | ICD-10-CM | POA: Insufficient documentation

## 2023-11-01 DIAGNOSIS — I110 Hypertensive heart disease with heart failure: Secondary | ICD-10-CM | POA: Insufficient documentation

## 2023-11-01 DIAGNOSIS — R0789 Other chest pain: Secondary | ICD-10-CM | POA: Diagnosis present

## 2023-11-01 MED ORDER — SPIRONOLACTONE 25 MG PO TABS: 25.0000 mg | ORAL_TABLET | Freq: Every day | ORAL | 0 refills | Status: DC

## 2023-11-01 NOTE — Addendum Note (Signed)
 Encounter addended by: Nameer Summer, [place], NP on: 11/01/2023 2:44 PM  Actions taken: Level of Service modified

## 2023-11-01 NOTE — Patient Instructions (Signed)
 Medication Changes:  INCREASE Spironolactone  to 25 mg (1 tab) Daily   Special Instructions // Education:  Do the following things EVERYDAY: Weigh yourself in the morning before breakfast. Write it down and keep it in a log. Take your medicines as prescribed Eat low salt foods--Limit salt (sodium) to 2000 mg per day.  Stay as active as you can everyday Limit all fluids for the day to less than 2 liters   Follow-Up in: Thank you for allowing us  to provider your heart failure care after your recent hospitalization. Please follow-up with [facility] as scheduled

## 2023-11-01 NOTE — Progress Notes (Signed)
 HEART & VASCULAR TRANSITION OF CARE CONSULT NOTE   Referring Physician: Dr. Wonda PCP: Gerome Brunet, DO  Cardiologist: Peter [place], MD  HPI: Referred to clinic by Dr. Wonda for heart failure consultation.   Jonathan Fischer is a 53 y.o. male with history CAD with chronic chest pain s/p prior stenting, chronic HFrEF d/t ICM, VT s/p ICD implantation, HTN, HLD.   Long history of STEMI in 2010, 2013, VVT in 2014 s/p ICD (EF at the time 30%). Lexiscan  in 2014 with scarring throughout with severe gHK and w/o ischemia.   LHC 7/18 revealed a 95% mid RCA lesion treated with Cutting Balloon angioplasty. Represented to OPV with angina and NSVT noted by ICD. LHC 03/2018 showed single-vessel obstructive CAD involving distal LAD, all prior stents widely patent, no intervention performed. 9/22 evaluated for ICD discharge for VT.    Admitted 10/25 with STEMI. LHC showed severe stenosis at distal stent edge before bifurcation of OM 2 subbranches successfully treated with 2.5 X12 DES. Severe stenosis at [facility] previous stent edge estimated at 90%, LVEDP 21 mmHg. Returned to cath lab and underwent successful PCI/DES 3.5 x 16 mm to RCA. Resolution of thrombotic occlusion in the proximal LAD was noted. Second heart cath complicated by Afib with RVR treated with IV amiodarone boluses and IV lopressor . He converted and remains in SR. No OAC planned. Echo this admission with chronically reduced LVEF 30-35%, WMA noted, grade 2 DD, normal RV, mild MR, mild AI.  Plavix  was transitioned to brilinta. He was discharged on DAPT, 80 mg lipitor .   Has been seen for hospital follow up Cardioglogy, doing well. He was transisitoned to Plavix , as Eligio was too expensive and he reports that he did not have good compliance with the medication prior to his STEMI.  Today he presents for transition of care visit with wife. Overall feeling well. NYHA I. Denies chest pain, dyspnea, fatigue, palpitations, and dizziness. Able  to perform ADLs. Appetite okay. Compliant with all medications. Has quit smoking.   Past Medical History:  Diagnosis Date   Abnormal echocardiogram    a. 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   AICD (automatic cardioverter/defibrillator) present 11/2009   Arthritis    AUTOMATIC IMPLANTABLE CARDIAC DEFIBRILLATOR SITU 03/03/2010   Qualifier: Diagnosis of  By: Fernande, MD, [facility], Elspeth Darner    CAD (coronary artery disease)    s/p prior LAD, LCx and RCA stenting remotely for an anterior MI, recurrent MI in 2010 with LAD stent occlusion s/p thrombectomy and PTCA, STEMI 12/2011 s/p DES-prox LAD   Cardiomyopathy, ischemic 12/14/2011   Chronic systolic CHF (congestive heart failure) (HCC)    Headache    migraines   HTN (hypertension)    Hyperlipidemia    ICD (implantable cardiac defibrillator) in place    Ischemic cardiomyopathy    EF 25-30% s/p single-chamber Medtronic ICD    NSVT (nonsustained ventricular tachycardia) (HCC) 12/14/2011   Obesity    STEMI (ST elevation myocardial infarction) (HCC) 12/12/2011   Tobacco abuse    TOBACCO USER 10/29/2009   Qualifier: Diagnosis of  By: Fernande, MD, C[facility] Elspeth Darner    Tooth pain 12/14/2011   VT (ventricular tachycardia) (HCC) 10/18/2012    Current Outpatient Medications  Medication Sig Dispense Refill   aspirin  EC 81 MG EC tablet Take 1 tablet (81 mg total) by mouth daily. 30 tablet 3   atorvastatin  (  LIPITOR ) 80 MG tablet Take 1 tablet (80 mg total) by mouth daily. 90 tablet 3   clopidogrel  (PLAVIX ) 75 MG tablet Instructions for initiating therapy only: FIRST DOSE TO BE TAKEN THE MORNING AFTER LAST PM DOSE OF BRILLINTA. TAKE 4 TABLETS (300 MG) LOADING DOSE ON DAY ONE. Then take one tablet (75 mg) daily thereafter. 90 tablet 3   empagliflozin  (JARDIANCE ) 10 MG TABS tablet Take 1 tablet (10 mg total) by mouth daily. 90 tablet 3    insulin degludec (TRESIBA FLEXTOUCH) 100 UNIT/ML FlexTouch Pen Inject 30 Units into the skin daily. 15 mL 1   metFORMIN (GLUCOPHAGE) 500 MG tablet Take 1 tablet (500 mg total) by mouth 2 (two) times daily with a meal. 180 tablet 3   metoprolol  succinate (TOPROL -XL) 25 MG 24 hr tablet Take 1 tablet (25 mg total) by mouth daily. 90 tablet 3   nicotine (NICODERM CQ - DOSED IN MG/24 HOURS) 14 mg/24hr patch Place 1 patch (14 mg total) onto the skin daily. 28 patch 0   nitroGLYCERIN  (NITROSTAT ) 0.4 MG SL tablet Place 1 tablet (0.4 mg total) under the tongue every 5 (five) minutes as needed. For chest pain 25 tablet 3   pantoprazole  (PROTONIX ) 40 MG tablet TAKE 1 TABLET ONE TIME DAILY (NEED MD APPOINTMENT) 90 tablet 3   sacubitril -valsartan  (ENTRESTO ) 24-26 MG Take 1 tablet by mouth 2 (two) times daily. 180 tablet 3   spironolactone  (ALDACTONE ) 25 MG tablet Take 1 tablet (25 mg total) by mouth daily. 90 tablet 0   No current facility-administered medications for this encounter.    No Known Allergies    Social History   Socioeconomic History   Marital status: Married    Spouse name: [name]   Number of children: 3   Years of education: Not on file   Highest education level: 11th grade  Occupational History   Occupation: disability  Tobacco Use   Smoking status: Every Day    Current packs/day: 1.00    Average packs/day: 1 pack/day for 24.0 years (24.0 ttl pk-yrs)    Types: E-cigarettes, Cigarettes   Smokeless tobacco: Never  Vaping Use   Vaping status: Never Used  Substance and Sexual Activity   Alcohol use: Yes    Comment: Occassionally   Drug use: No   Sexual activity: Yes  Other Topics Concern   Not on file  Social History Narrative   Not on file   Social Drivers of Health   Financial Resource Strain: Low Risk  (10/18/2023)   Overall Financial Resource Strain (CARDIA)    Difficulty of Paying Living Expenses: Not very hard  Food Insecurity: No Food Insecurity (10/19/2023)    Hunger Vital Sign    Worried About Running Out of Food in the Last Year: Never true    Ran Out of Food in the Last Year: Never true  Transportation Needs: No Transportation Needs (10/19/2023)   PRAPARE - Administrator, Civil Service (Medical): No    Lack of Transportation (Non-Medical): No  Physical Activity: Not on file  Stress: Not on file  Social Connections: Not on file  Intimate Partner Violence: Not At Risk (10/19/2023)   Humiliation, Afraid, Rape, and Kick questionnaire    Fear of Current or Ex-Partner: No    Emotionally Abused: No    Physically Abused: No    Sexually Abused: No      Family History  Problem Relation Age of Onset   Heart disease Father    Cancer Mother  Blood pressure (!) 118/58, pulse 64, height 6' 1 (1.854 m), weight 96.2 kg (212 lb), SpO2 98%.  Filed Weights   11/01/23 1411  Weight: 96.2 kg (212 lb)    PHYSICAL EXAM: General: Well appearing. No distress on RA Cardiac: JVP flat. S1 and S2 present. No murmurs Extremities: Warm and dry.  No edema.  Neuro: Alert and oriented x3. Affect pleasant. Moves all extremities without difficulty.  MDT Interrogation (personally reviewed):<0.1% AF, 0 VT, 3h/d activity, Optivol stable  ASSESSMENT & PLAN:  Chronic HFrEF; ICM - Known reduced EF ~30-40% since at least 2012 due to ICM - Most recent echo 10/25 with EF 30-35%, G2DD, nl RV, mild MR - Has MDT ICD - NYHA I. Euvolemic on exam and Optivol - GDMT: ? blocker: continue toprol  XL 25 mg daily ARB/ARNI: continue entresto  24-26 mg bid MRA: increase spiro to 25 mg daily SGLT2i: continue jardiance  10 mg daily  CAD - s/p prior LAD, LCX, RVA stenting for an anterior MI, recurrent MI in 2010 with LAD stent occlusion s/p thrombectomy and PTCA, STEMI 12/2011 s/p DES-prox LAD. - PCI 10/25 s/p DES to RCA and resolution of thrombotic lesion in proxLAD stent with mild residual restenosis - continue atorva 80 mg + ASA 81 mg daily - on birlinta, will  swtich to plavix  when he runs out  Paroxysmal Atrial Fibrillation - isolated post-cath - recommend sleep study wife thinks he has sleeps apnea, has refuse testing in past  Ascending aoritc aneurysm - CTA 4/22 with 4.3 cm  - Echo 10/5 with 4.1 cm  - recommend annual CTA for screening  HTN - BP controlled w GDMT  H/o Smoking - reports cessation  Referred to HFSW (PCP, Medications, Transportation, ETOH Abuse, Drug Abuse, Insurance, Surveyor, quantity ): No Refer to Pharmacy: No Refer to Home Health: No Refer to [facility]: No Refer to General Cardiology: No, already established  Follow up with [facility] as scheduled.  [place] Deem Marmol, NP 11/01/23

## 2023-11-08 ENCOUNTER — Other Ambulatory Visit (HOSPITAL_COMMUNITY): Payer: Self-pay

## 2023-12-01 ENCOUNTER — Encounter: Payer: Self-pay | Admitting: Cardiology

## 2023-12-20 ENCOUNTER — Ambulatory Visit: Payer: Medicare HMO

## 2023-12-22 LAB — CUP PACEART REMOTE DEVICE CHECK
Battery Remaining Longevity: 75 mo
Battery Voltage: 2.94 V
Brady Statistic RV Percent Paced: 0.06 %
Date Time Interrogation Session: 20251209091706
HighPow Impedance: 50 Ohm
HighPow Impedance: 81 Ohm
Implantable Lead Connection Status: 753985
Implantable Lead Implant Date: 20111102
Implantable Lead Location: 753860
Implantable Lead Model: 6947
Implantable Pulse Generator Implant Date: 20200508
Lead Channel Impedance Value: 323 Ohm
Lead Channel Impedance Value: 399 Ohm
Lead Channel Pacing Threshold Amplitude: 1.25 V
Lead Channel Pacing Threshold Pulse Width: 0.4 ms
Lead Channel Sensing Intrinsic Amplitude: 21.625 mV
Lead Channel Sensing Intrinsic Amplitude: 21.625 mV
Lead Channel Setting Pacing Amplitude: 3 V
Lead Channel Setting Pacing Pulse Width: 0.4 ms
Lead Channel Setting Sensing Sensitivity: 0.3 mV
Zone Setting Status: 755011
Zone Setting Status: 755011

## 2023-12-23 ENCOUNTER — Telehealth: Payer: Self-pay | Admitting: Physician Assistant

## 2023-12-23 NOTE — Telephone Encounter (Signed)
 Fax rec'vd from [facility]-DRUG CHANGE REQUEST-See sscanned doc in media for review

## 2023-12-28 NOTE — Progress Notes (Signed)
 Remote ICD Transmission

## 2023-12-29 ENCOUNTER — Other Ambulatory Visit: Payer: Self-pay

## 2023-12-29 ENCOUNTER — Ambulatory Visit: Payer: Self-pay | Admitting: Cardiology

## 2023-12-29 MED ORDER — ATORVASTATIN CALCIUM 80 MG PO TABS: 80.0000 mg | ORAL_TABLET | Freq: Every day | ORAL | 3 refills | Status: AC

## 2023-12-29 MED ORDER — LOSARTAN POTASSIUM 50 MG PO TABS: 50.0000 mg | ORAL_TABLET | Freq: Every day | ORAL | 3 refills | Status: AC

## 2023-12-29 MED ORDER — SPIRONOLACTONE 25 MG PO TABS: 25.0000 mg | ORAL_TABLET | Freq: Every day | ORAL | 3 refills | Status: AC

## 2023-12-29 MED ORDER — METOPROLOL SUCCINATE ER 25 MG PO TB24: 25.0000 mg | ORAL_TABLET | Freq: Every day | ORAL | 3 refills | Status: AC

## 2023-12-29 NOTE — Telephone Encounter (Signed)
 Spoke to patient he stated he is not taking Entresto .Stated cost too much.Spoke to Dr.Jordan he advised to start Losartan  50 mg daily. Patient stated he cannot take Metformin  causes abdominal pain.He is in process of finding a new PCP. Advised to keep appointment with Katlyn West NP 1/14 at 3:10 pm.

## 2023-12-30 ENCOUNTER — Other Ambulatory Visit: Payer: Self-pay | Admitting: Cardiology

## 2023-12-30 MED ORDER — PANTOPRAZOLE SODIUM 40 MG PO TBEC: 40.0000 mg | DELAYED_RELEASE_TABLET | Freq: Every day | ORAL | 2 refills | Status: AC

## 2024-01-22 NOTE — Progress Notes (Unsigned)
 "  Cardiology Office Note    Date:  01/22/2024  ID:  Jonathan Fischer, DOB 1970-01-14, MRN [id number] PCP:  Gerome Brunet, DO  Cardiologist:  Peter Jordan, MD  Electrophysiologist:  Fonda Kitty, MD   Chief Complaint: ***  History of Present Illness: .    Jonathan Fischer is a 54 y.o. male with visit-pertinent history of CAD s/p multiple PCIs, ICM s/p ICD, VT, hypertension, hyperlipidemia.  Patient with a remote STEMI in 2010, 2013, VT in 2014 with ICD discharge, echocardiogram at time showed EF 30%.  Lexiscan  in 10/2012 showed large scarring of entire apex, mid anterior wall, mid septum and mid anterolateral wall, LVEF 36% with severe hypokinesis of entire apex, mid anterior wall, mid septum, mid anterolateral wall, no ischemia observed.  LHC in 07/2016 revealed 95% mid RCA lesion treated with Cutting Balloon angioplasty.  At EP follow-up in 2020 patient complained of exertional chest heaviness with shortness of breath, relieved with rest that have been worsening for the prior 6 months.  ICD documented nonsustained VT.  LHC in 03/2018 showed single-vessel obstructive CAD involving distal LAD, all previous stents widely patent, no intervention performed.  In 09/2020 evaluated for ICD discharge for VT.  Patient was asymptomatic at 02/2022 office visit follow-up, was unable to afford Entresto  with an EF of 30 to 35%.  On 10/16/2023 patient arrived to the ED via EMS with complaints of acute onset substernal chest pain occurring following intercourse.  Patient took multiple doses of sublingual nitroglycerin  which did not improve his symptoms.  EKG on arrival showed inferior ST elevation, LHC showed severe stenosis at distal stent edge before bifurcation of OM 2 subbranches successfully treated with 2.5 x 12 mm DES.  Severe stenosis at P RCA previous stent edge estimated at 90%, LVEDP 21 mmHg, plan for staged PCI of the LAD and RCA vessels.  Patient return to the Cath Lab on 10/17/2023 underwent  successful PCI/DES 3.5 x 16 mm to the RCA.  Resolution of thrombotic occlusion in the proximal LAD was noted.  Second heart cath complicated by A-fib with RVR treated with IV amiodarone  boluses and IV Lopressor .  Patient converted and remained in sinus rhythm.  No OAC planned.  Echo during admission with chronically reduced LVEF at 30 to 35%, WMA noted, grade 2 DD, normal RV, mild MR, mild AI.  Patient was discharged on DAPT with Brilinta  and aspirin .  Patient was seen in clinic on 10/25/2023, he had remained stable for cardiac standpoint.  Patient reported that he had lost 60 pounds in the prior year and was trying to increase his exercise, was using nicotine  patches and nicotine  gum.  Patient requested transitioning from Brilinta  to Plavix  as he noted concerns regarding compliance.  Today he presents for follow-up.  He reports that he   CAD: HFrEF: HTN: HLD:    Labwork independently reviewed:   ROS: .   *** denies chest pain, shortness of breath, lower extremity edema, fatigue, palpitations, melena, hematuria, hemoptysis, diaphoresis, weakness, presyncope, syncope, orthopnea, and PND.  All other systems are reviewed and otherwise negative.  Studies Reviewed: SABRA    EKG:  EKG is ordered today, personally reviewed, demonstrating ***     CV Studies: Cardiac studies reviewed are outlined and summarized above. Otherwise please see EMR for full report. Cardiac Studies & Procedures   ______________________________________________________________________________________________ CARDIAC CATHETERIZATION  CARDIAC CATHETERIZATION 10/17/2023  Conclusion 1.  Successful PCI of severe stenosis in the proximal RCA using a 3.5 x 16 mm  Synergy DES 2.  Resolution of the thrombotic lesion within the proximal LAD stent, now with mild diffuse restenosis, TIMI-3 flow, and no evidence of filling defect on angiography 3.  Atrial fibrillation with RVR occurs at the completion of the procedure, patient treated  with amiodarone  boluses and IV metoprolol  bolus.  Will start on IV amiodarone  upon return to the CV-ICU.  Will start IV heparin  once the TR band is off.  Further plans for anticoagulation per the primary service.  May need to alter antiplatelet therapy if decision made to put him on long-term oral anticoagulation.  Findings Coronary Findings Diagnostic  Dominance: Right  Left Main Vessel is normal in caliber. Vessel is angiographically normal.  Left Anterior Descending Ost LAD to Prox LAD lesion is 10% stenosed. Non-stenotic Prox LAD to Mid LAD lesion was previously treated. Dist LAD lesion is 85% stenosed.  Left Circumflex Prox Cx lesion is 30% stenosed. The lesion was previously treated . Prox Cx to Mid Cx lesion is 30% stenosed. The lesion was previously treated using a drug eluting stent over 2 years ago. Non-stenotic Mid Cx to Dist Cx lesion was previously treated. Vessel is the culprit lesion.  Right Coronary Artery Prox RCA lesion is 90% stenosed. Prox RCA to Mid RCA lesion is 30% stenosed. The lesion was previously treated using a drug eluting stent over 2 years ago. Non-stenotic Mid RCA lesion was previously treated. The lesion is focal.  Intervention  Prox RCA lesion Stent (Also treats lesions: Prox RCA to Mid RCA) A drug-eluting stent was successfully placed using a STENT SYNERGY XD 3.50X16. Stent overlaps previously placed stent. Post-stent angioplasty was performed using a BALLOON SAPPHIRE NC24 [id number]. Maximum pressure:  18 atm. Post-Intervention Lesion Assessment The intervention was successful. Pre-interventional TIMI flow is 3. Post-intervention TIMI flow is 3. No complications occurred at this lesion. There is a 0% residual stenosis post intervention.  Prox RCA to Mid RCA lesion Stent (Also treats lesions: Prox RCA) See details in Prox RCA lesion. Post-Intervention Lesion Assessment The intervention was successful. Pre-interventional TIMI flow is 3.  Post-intervention TIMI flow is 3. No complications occurred at this lesion. There is a 0% residual stenosis post intervention.   CARDIAC CATHETERIZATION 10/16/2023  Conclusion 1.  Acute STEMI involving the left circumflex, previously stented vessel with severe stenosis at the distal stent edge before the bifurcation of 2 OM subbranches.  Treated successfully with a 2.5 x 12 mm Onyx DES 2.  Severe stenosis of the proximal LAD with near subtotal occlusion of the old stent greater than 95% stenosis 3.  Severe 90% stenosis of the RCA at the previous stent edge in the proximal vessel 4.  Mildly elevated LVEDP 21 mmHg  Recommendations: The patient is admitted to the CV ICU for further care.  He has been on clopidogrel  75 mg daily along with aspirin  and he is now loaded with ticagrelor .  With his severe ischemic cardiomyopathy and multiple MIs, likely will proceed with staged PCI of the LAD and RCA vessels prior to discharge.  Plan discussed with patient and family. Check 2D echo to reassess LV function. Lactate 1.2 at completion of procedure, pt hemodynamically stable.  Findings Coronary Findings Diagnostic  Dominance: Right  Left Main Vessel is normal in caliber. Vessel is angiographically normal.  Left Anterior Descending Ost LAD to Prox LAD lesion is 95% stenosed. Non-stenotic Prox LAD to Mid LAD lesion was previously treated. Dist LAD lesion is 85% stenosed.  Left Circumflex Prox Cx lesion is 30% stenosed.  The lesion was previously treated . Prox Cx to Mid Cx lesion is 50% stenosed. The lesion was previously treated using a drug eluting stent over 2 years ago. Mid Cx to Dist Cx lesion is 99% stenosed. Vessel is the culprit lesion.  Right Coronary Artery Prox RCA lesion is 90% stenosed. Prox RCA to Mid RCA lesion is 30% stenosed. The lesion was previously treated using a drug eluting stent over 2 years ago. Non-stenotic Mid RCA lesion was previously treated. The lesion is  focal.  Intervention  Mid Cx to Dist Cx lesion Stent Lesion crossed with guidewire. Pre-stent angioplasty was performed. A drug-eluting stent was successfully placed using a STENT ONYX FRONTIER 2.5X12. Post-stent angioplasty was performed using a BALLOON [place] EMERGE MR [id number]. Maximum pressure:  18 atm. Post-Intervention Lesion Assessment The intervention was successful. Pre-interventional TIMI flow is 1. Post-intervention TIMI flow is 3. No complications occurred at this lesion. There is a 0% residual stenosis post intervention.     ECHOCARDIOGRAM  ECHOCARDIOGRAM COMPLETE 10/16/2023  Narrative ECHOCARDIOGRAM REPORT    Patient Name:   Jonathan Fischer Date of Exam: 10/16/2023 Medical Rec #:  [id number]           Height:       73.0 in Accession #:    [id number]          Weight:       211.9 lb Date of Birth:  05-07-1970          BSA:          2.205 m Patient Age:    52 years            BP:           117/75 mmHg Patient Gender: M                   HR:           61 bpm. Exam Location:  Inpatient  Procedure: 2D Echo, Cardiac Doppler, Color Doppler and Intracardiac Opacification Agent (Both Spectral and Color Flow Doppler were utilized during procedure).  Indications:    Chest Pain R07.9  History:        Patient has no prior history of Echocardiogram examinations. CHF, Acute MI and CAD, Signs/Symptoms:Chest Pain; Risk Factors:Current Smoker and Hypertension. STEMI, Ischemic cardiomyopathy, Hyperlipidemia, Ventricular tachycardia.  Sonographer:    BERNARDA ROCKS Referring Phys: [id number] MICHAEL COOPER  IMPRESSIONS   1. Left ventricular ejection fraction, by estimation, is 30 to 35%. The left ventricle has moderate to severely decreased function. The left ventricle demonstrates regional wall motion abnormalities (see scoring diagram/findings for description). The left ventricular internal cavity size was severely dilated. Left ventricular diastolic parameters are consistent with  Grade Fischer diastolic dysfunction (pseudonormalization). 2. Right ventricular systolic function is normal. The right ventricular size is moderately enlarged. 3. The mitral valve is normal in structure. Mild mitral valve regurgitation. 4. The aortic valve is normal in structure. Aortic valve regurgitation is mild. 5. There is mild dilatation of the ascending aorta, measuring 41 mm. 6. The inferior vena cava is dilated in size with <50% respiratory variability, suggesting right atrial pressure of 15 mmHg.  FINDINGS Left Ventricle: Left ventricular ejection fraction, by estimation, is 30 to 35%. The left ventricle has moderate to severely decreased function. The left ventricle demonstrates regional wall motion abnormalities. Definity  contrast agent was given IV to delineate the left ventricular endocardial borders. The left ventricular internal cavity size was severely dilated. There is no left  ventricular hypertrophy. Left ventricular diastolic parameters are consistent with Grade Fischer diastolic dysfunction (pseudonormalization).   LV Wall Scoring: The apical lateral segment, apical septal segment, and apex are akinetic. The mid and distal anterior wall, antero-lateral wall, mid and distal inferior wall, posterior wall, mid anteroseptal segment, mid inferoseptal segment, and basal inferoseptal segment are hypokinetic. The basal anteroseptal segment, basal anterior segment, and basal inferior segment are normal.  Right Ventricle: The right ventricular size is moderately enlarged. No increase in right ventricular wall thickness. Right ventricular systolic function is normal.  Left Atrium: Left atrial size was normal in size.  Right Atrium: Right atrial size was normal in size.  Pericardium: There is no evidence of pericardial effusion.  Mitral Valve: The mitral valve is normal in structure. Mild mitral valve regurgitation. MV peak gradient, 4.0 mmHg. The mean mitral valve gradient is 2.0  mmHg.  Tricuspid Valve: The tricuspid valve is normal in structure. Tricuspid valve regurgitation is mild.  Aortic Valve: The aortic valve is normal in structure. Aortic valve regurgitation is mild. Aortic valve mean gradient measures 9.0 mmHg. Aortic valve peak gradient measures 15.8 mmHg. Aortic valve area, by VTI measures 2.35 cm.  Pulmonic Valve: The pulmonic valve was normal in structure. Pulmonic valve regurgitation is not visualized.  Aorta: The aortic root was not well visualized. There is mild dilatation of the ascending aorta, measuring 41 mm.  Venous: The inferior vena cava is dilated in size with less than 50% respiratory variability, suggesting right atrial pressure of 15 mmHg.  IAS/Shunts: No atrial level shunt detected by color flow Doppler.   LEFT VENTRICLE PLAX 2D LVIDd:         6.60 cm      Diastology LVIDs:         5.30 cm      LV e' medial:    7.94 cm/s LV PW:         0.90 cm      LV E/e' medial:  11.0 LV IVS:        0.90 cm      LV e' lateral:   8.27 cm/s LVOT diam:     2.50 cm      LV E/e' lateral: 10.6 LV SV:         92 LV SV Index:   42 LVOT Area:     4.91 cm  LV Volumes (MOD) LV vol d, MOD A2C: 316.0 ml LV vol d, MOD A4C: 308.0 ml LV vol s, MOD A2C: 186.0 ml LV vol s, MOD A4C: 204.0 ml LV SV MOD A2C:     130.0 ml LV SV MOD A4C:     308.0 ml LV SV MOD BP:      123.2 ml  RIGHT VENTRICLE             IVC RV Basal diam:  4.60 cm     IVC diam: 2.50 cm RV S prime:     14.90 cm/s TAPSE (M-mode): 2.5 cm RVSP:           46.9 mmHg  LEFT ATRIUM             Index        RIGHT ATRIUM           Index LA diam:        5.10 cm 2.31 cm/m   RA Pressure: 8.00 mmHg LA Vol (A2C):   75.5 ml 34.24 ml/m  RA Area:     19.40 cm LA Vol (  A4C):   72.6 ml 32.93 ml/m  RA Volume:   52.40 ml  23.76 ml/m LA Biplane Vol: 73.8 ml 33.47 ml/m AORTIC VALVE                     PULMONIC VALVE AV Area (Vmax):    2.34 cm      PV Vmax:       0.80 m/s AV Area (Vmean):   1.94 cm       PV Peak grad:  2.5 mmHg AV Area (VTI):     2.35 cm AV Vmax:           199.00 cm/s AV Vmean:          144.000 cm/s AV VTI:            0.392 m AV Peak Grad:      15.8 mmHg AV Mean Grad:      9.0 mmHg LVOT Vmax:         94.70 cm/s LVOT Vmean:        57.000 cm/s LVOT VTI:          0.188 m LVOT/AV VTI ratio: 0.48  AORTA Ao Root diam: 3.40 cm Ao Asc diam:  4.10 cm  MITRAL VALVE               TRICUSPID VALVE MV Area (PHT): 3.03 cm    TR Peak grad:   38.9 mmHg MV Area VTI:   2.59 cm    TR Mean grad:   39.0 mmHg MV Peak grad:  4.0 mmHg    TR Vmax:        312.00 cm/s MV Mean grad:  2.0 mmHg    Estimated RAP:  8.00 mmHg MV Vmax:       1.00 m/s    RVSP:           46.9 mmHg MV Vmean:      62.2 cm/s MV Decel Time: 250 msec    SHUNTS MV E velocity: 87.50 cm/s  Systemic VTI:  0.19 m MV A velocity: 88.20 cm/s  Systemic Diam: 2.50 cm MV E/A ratio:  0.99  Franck Azobou Tonleu Electronically signed by Joelle Cedars Tonleu Signature Date/Time: 10/16/2023/12:51:07 PM    Final          ______________________________________________________________________________________________       Current Reported Medications:.    Active Medications[1]  Physical Exam:    VS:  There were no vitals taken for this visit.   Wt Readings from Last 3 Encounters:  11/01/23 212 lb (96.2 kg)  10/25/23 212 lb 9.6 oz (96.4 kg)  10/17/23 211 lb 12.8 oz (96.1 kg)    GEN: Well nourished, well developed in no acute distress NECK: No JVD; No carotid bruits CARDIAC: ***RRR, no murmurs, rubs, gallops RESPIRATORY:  Clear to auscultation without rales, wheezing or rhonchi  ABDOMEN: Soft, non-tender, non-distended EXTREMITIES:  No edema; No acute deformity     Asessement and Plan:.     ***     Disposition: F/u with ***  Signed, Arcenia Scarbro D Shemeka Wardle, NP      [1]  No outpatient medications have been marked as taking for the 01/25/24 encounter (Appointment) with Hendricks Schwandt D, NP.   "

## 2024-01-25 ENCOUNTER — Ambulatory Visit: Admitting: Cardiology

## 2024-01-25 DIAGNOSIS — Z87891 Personal history of nicotine dependence: Secondary | ICD-10-CM

## 2024-01-25 DIAGNOSIS — I10 Essential (primary) hypertension: Secondary | ICD-10-CM

## 2024-01-25 DIAGNOSIS — I255 Ischemic cardiomyopathy: Secondary | ICD-10-CM

## 2024-01-25 DIAGNOSIS — I7121 Aneurysm of the ascending aorta, without rupture: Secondary | ICD-10-CM

## 2024-01-25 DIAGNOSIS — I5022 Chronic systolic (congestive) heart failure: Secondary | ICD-10-CM

## 2024-01-25 DIAGNOSIS — I480 Paroxysmal atrial fibrillation: Secondary | ICD-10-CM

## 2024-01-25 DIAGNOSIS — I2510 Atherosclerotic heart disease of native coronary artery without angina pectoris: Secondary | ICD-10-CM
